# Patient Record
Sex: Female | Born: 1965 | Race: White | Hispanic: No | Marital: Married | Smoking: Current every day smoker
Health system: Southern US, Community
[De-identification: ages and names within clinical notes are randomized; demographics above are authoritative.]

## PROBLEM LIST (undated history)

## (undated) DIAGNOSIS — F419 Anxiety disorder, unspecified: Secondary | ICD-10-CM

## (undated) DIAGNOSIS — I10 Essential (primary) hypertension: Secondary | ICD-10-CM

## (undated) DIAGNOSIS — N926 Irregular menstruation, unspecified: Secondary | ICD-10-CM

## (undated) DIAGNOSIS — E785 Hyperlipidemia, unspecified: Secondary | ICD-10-CM

## (undated) DIAGNOSIS — R5383 Other fatigue: Secondary | ICD-10-CM

## (undated) DIAGNOSIS — R011 Cardiac murmur, unspecified: Secondary | ICD-10-CM

## (undated) DIAGNOSIS — E669 Obesity, unspecified: Secondary | ICD-10-CM

## (undated) HISTORY — DX: Irregular menstruation, unspecified: N92.6

## (undated) HISTORY — DX: Anxiety disorder, unspecified: F41.9

## (undated) HISTORY — DX: Other fatigue: R53.83

## (undated) HISTORY — PX: BACK SURGERY: SHX140

## (undated) HISTORY — DX: Essential (primary) hypertension: I10

## (undated) HISTORY — PX: SPINE SURGERY: SHX786

## (undated) HISTORY — DX: Obesity, unspecified: E66.9

## (undated) HISTORY — DX: Hyperlipidemia, unspecified: E78.5

## (undated) HISTORY — DX: Cardiac murmur, unspecified: R01.1

---

## 2000-10-29 ENCOUNTER — Inpatient Hospital Stay (HOSPITAL_COMMUNITY): Admission: RE | Admit: 2000-10-29 | Discharge: 2000-10-30 | Payer: Self-pay | Admitting: Neurosurgery

## 2000-10-29 ENCOUNTER — Encounter: Payer: Self-pay | Admitting: Neurosurgery

## 2001-08-14 ENCOUNTER — Inpatient Hospital Stay (HOSPITAL_COMMUNITY): Admission: RE | Admit: 2001-08-14 | Discharge: 2001-08-16 | Payer: Self-pay | Admitting: Neurosurgery

## 2001-08-14 ENCOUNTER — Encounter: Payer: Self-pay | Admitting: Neurosurgery

## 2004-04-08 ENCOUNTER — Emergency Department: Payer: Self-pay | Admitting: Emergency Medicine

## 2004-04-13 ENCOUNTER — Ambulatory Visit: Payer: Self-pay | Admitting: Family Medicine

## 2004-04-19 ENCOUNTER — Ambulatory Visit: Payer: Self-pay | Admitting: Family Medicine

## 2005-02-05 ENCOUNTER — Other Ambulatory Visit: Admission: RE | Admit: 2005-02-05 | Discharge: 2005-02-05 | Payer: Self-pay | Admitting: Obstetrics and Gynecology

## 2005-02-06 ENCOUNTER — Other Ambulatory Visit: Admission: RE | Admit: 2005-02-06 | Discharge: 2005-02-06 | Payer: Self-pay | Admitting: Obstetrics and Gynecology

## 2008-07-03 ENCOUNTER — Encounter: Admission: RE | Admit: 2008-07-03 | Discharge: 2008-07-03 | Payer: Self-pay | Admitting: Neurosurgery

## 2008-07-13 ENCOUNTER — Inpatient Hospital Stay (HOSPITAL_COMMUNITY): Admission: RE | Admit: 2008-07-13 | Discharge: 2008-07-18 | Payer: Self-pay | Admitting: Neurosurgery

## 2008-12-11 ENCOUNTER — Emergency Department: Payer: Self-pay | Admitting: Emergency Medicine

## 2009-01-05 ENCOUNTER — Encounter: Admission: RE | Admit: 2009-01-05 | Discharge: 2009-01-05 | Payer: Self-pay | Admitting: Neurosurgery

## 2009-01-18 ENCOUNTER — Ambulatory Visit (HOSPITAL_COMMUNITY): Admission: RE | Admit: 2009-01-18 | Discharge: 2009-01-18 | Payer: Self-pay | Admitting: Neurosurgery

## 2009-02-22 ENCOUNTER — Emergency Department: Payer: Self-pay | Admitting: Emergency Medicine

## 2010-04-05 IMAGING — RF DG MYELOGRAM LUMBAR
13 of 21 series · 13 of 21 positions shown · IV contrast (omnipaque)
Comparison: Preoperative examination 07/03/2008 MRI

MYELOGRAM  INJECTION
TECHNIQUE: Informed consent was obtained from the patient prior to
the procedure, including potential complications of headache,
allergy, infection and pain. Specific instructions were given
regarding 24 hour bedrest post procedure to prevent post-LP
headache.  A timeout procedure was performed.  With the patient
prone, the lower back was prepped with Betadine.  1% Lidocaine was
used for local anesthesia.  Lumbar puncture was performed by the
radiologist at the L2 - L3 level using a 22 gauge needle with
return of clear CSF.  15 cc of Omnipaque 180 was injected into the
subarachnoid space .
CLINICAL DATA: Right leg pain and numbness post surgery.  Previous
L4-5 discectomy with interbody and posterolateral fusion.
TECHNIQUE: Multidetector CT imaging of the lumbar spine was
performed following myelography.  Multiplanar CT image
reconstructions were also generated.

[Series 1: myelogram  white · 1 of 1 slices shown (1 of 13)]
[im 1/1]
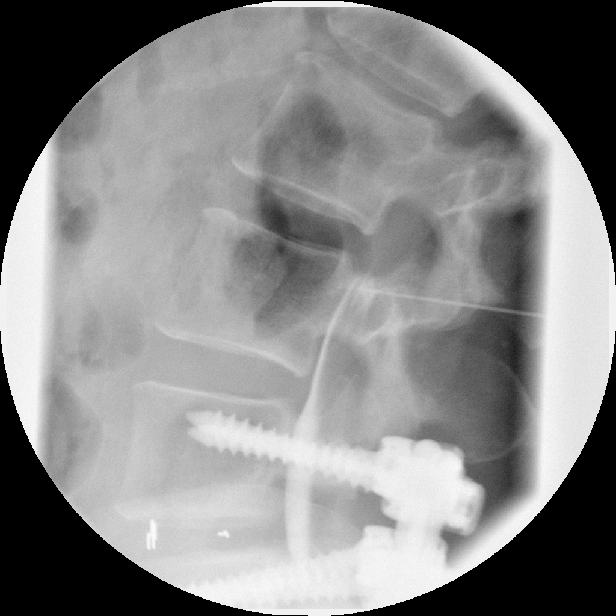

[Series 3: myelogram  white · 1 of 1 slices shown (2 of 13)]
[im 1/1]
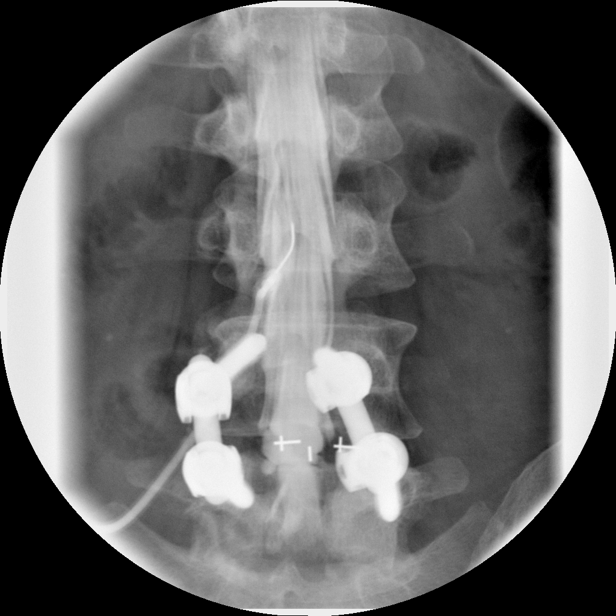

[Series 5: myelogram  white · 1 of 1 slices shown (3 of 13)]
[im 1/1]
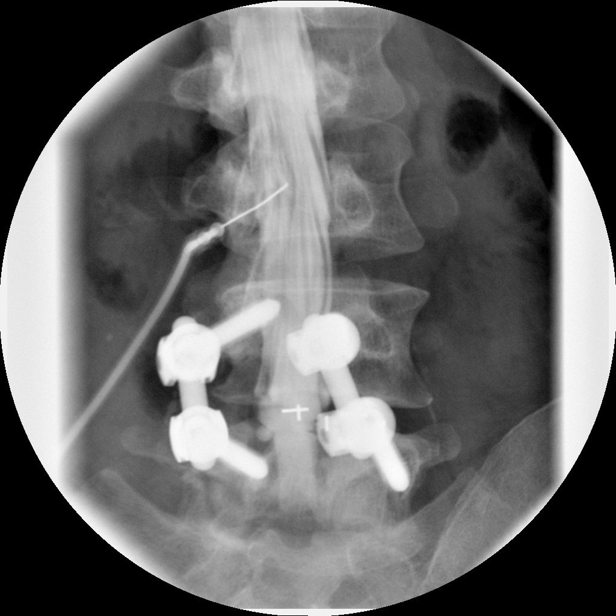

[Series 6: myelogram  white · 1 of 1 slices shown (4 of 13)]
[im 1/1]
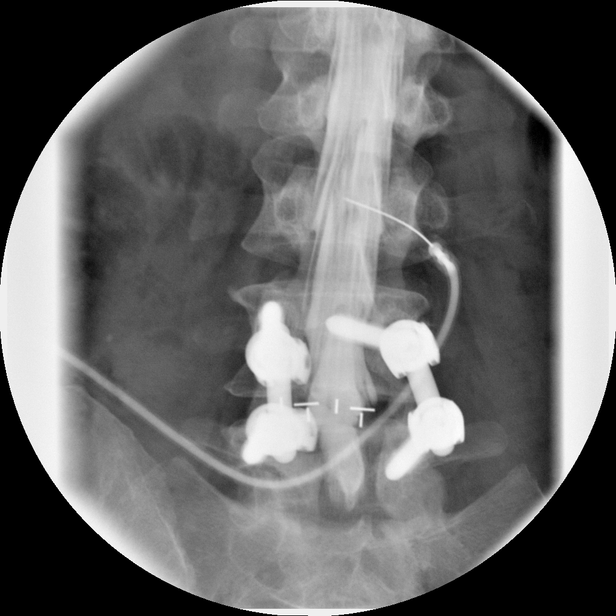

[Series 8: myelogram  white · 1 of 1 slices shown (5 of 13)]
[im 1/1]
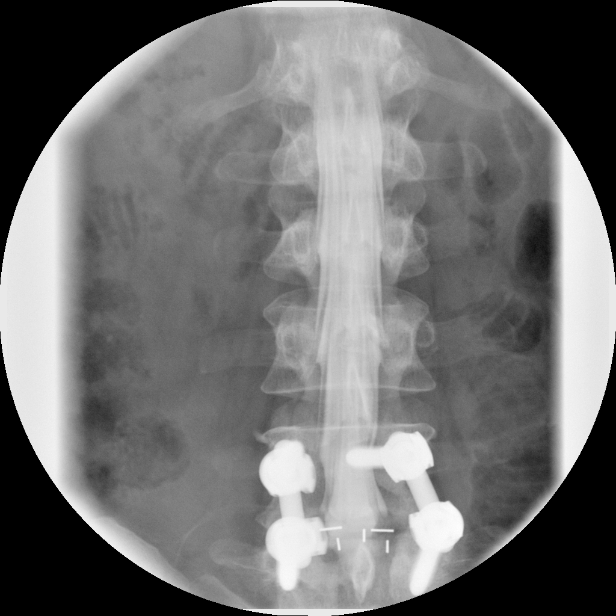

[Series 9: myelogram  white · 1 of 1 slices shown (6 of 13)]
[im 1/1]
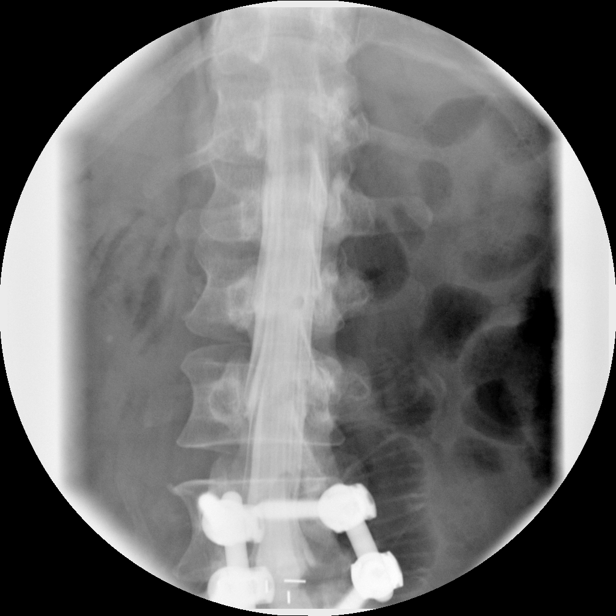

[Series 11: myelogram  white · 1 of 1 slices shown (7 of 13)]
[im 1/1]
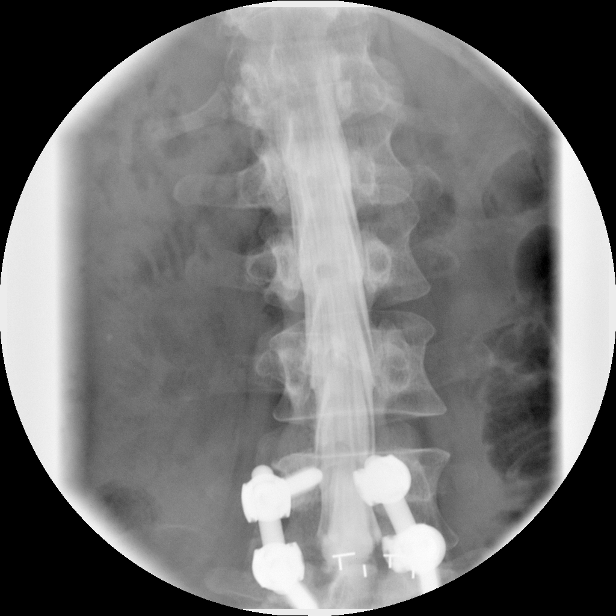

[Series 13: myelogram  white · 1 of 1 slices shown (8 of 13)]
[im 1/1]
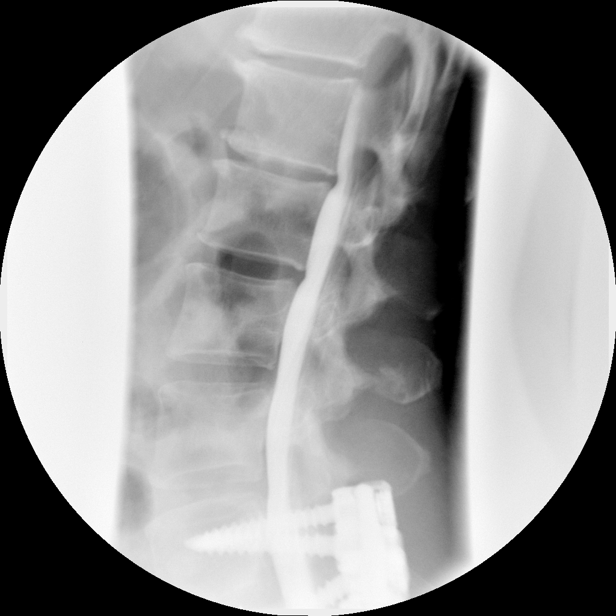

[Series 14: myelogram  white · 1 of 1 slices shown (9 of 13)]
[im 1/1]
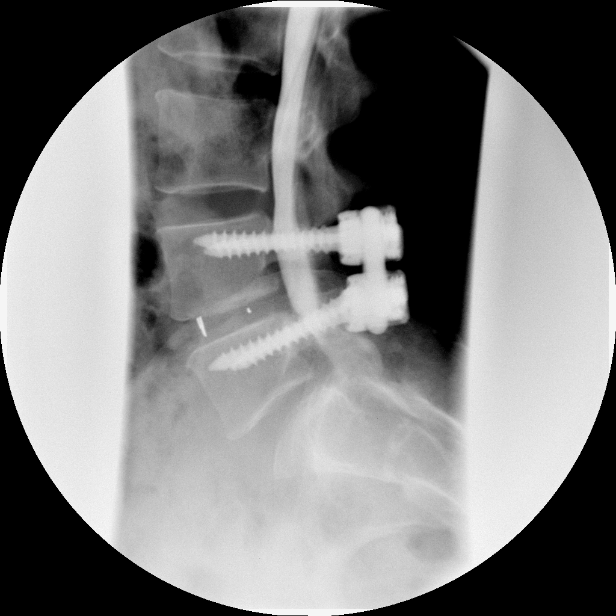

[Series 16: myelogram  white · 1 of 1 slices shown (10 of 13)]
[im 1/1]
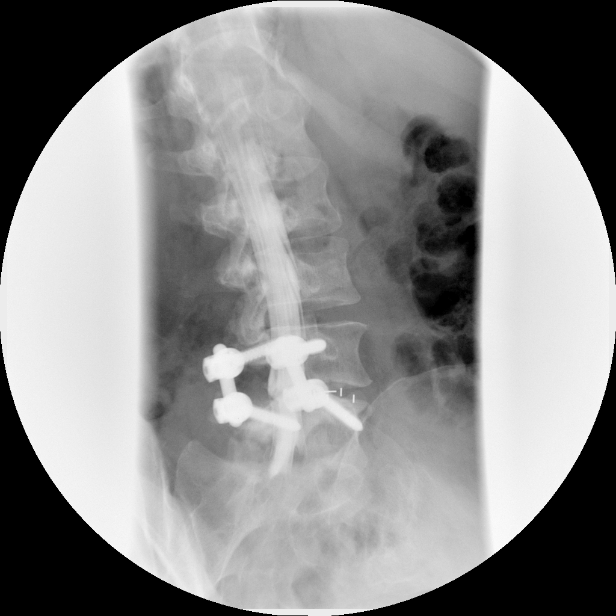

[Series 17: myelogram  white · 1 of 1 slices shown (11 of 13)]
[im 1/1]
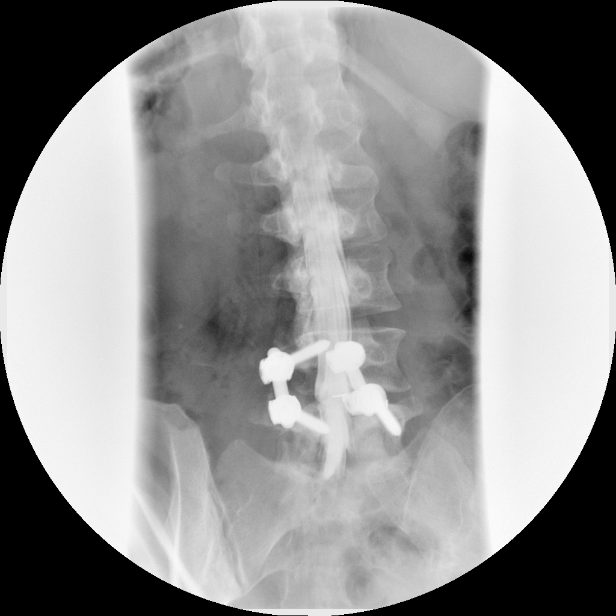

[Series 19: myelogram  white · 1 of 1 slices shown (12 of 13)]
[im 1/1]
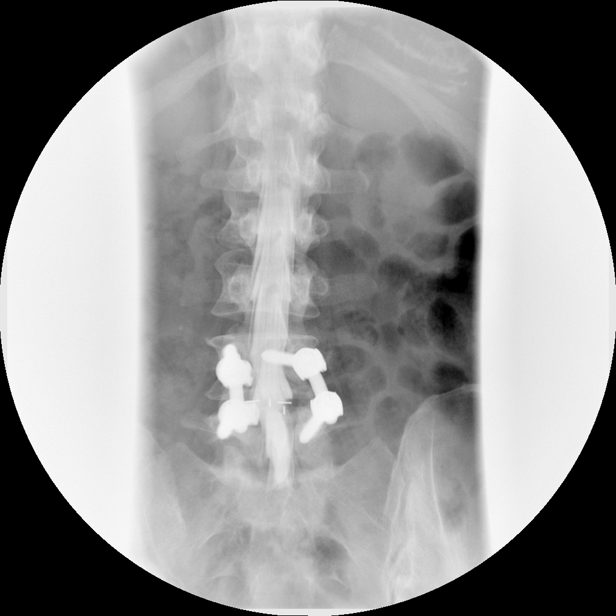

[Series 21: myelogram  white · 1 of 1 slices shown (13 of 13)]
[im 1/1]
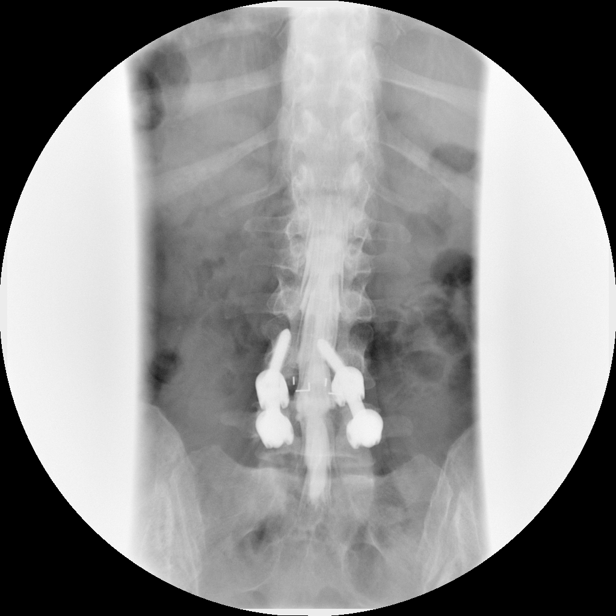

[13 of 21 positions shown; findings below may reference images not displayed]

IMPRESSION: Successful injection of  intrathecal contrast for myelography.

MYELOGRAM LUMBAR
FINDINGS: Good opacification of the subarachnoid space.  The left
L4, left L5 and right L5 screws are satisfactorily positioned.  The
right L4 screw is medially positioned, and it enters the lateral
recess of the spinal canal.  This displaces the right L4 and L5
nerve roots. Anatomic alignment is present.  Standing
flexion/extension views show no subluxation or dynamic instability.

Fluoroscopy Time: 1.32 minutes
IMPRESSION: As above

CT MYELOGRAPHY LUMBAR SPINE
FINDINGS: No prevertebral or paraspinous masses.

L1-2: Normal.

L2-3: Normal.

L3-4: Mild facet arthropathy.  Mild bulge.  No stenosis or disc
protrusion.

L4-5: Wide decompressive laminectomy.  The left L4 pedicle screw is
normally positioned.  The right L4 pedicle screw is medially
positioned and lies outside the pedicle.  The portion of the screw
within the spinal canal indents the thecal sac and displaces the
right L4 and right L5 nerve roots.  There is no CSF leakage.  There
appears to be early bony bridging across the interspace.  No
definite posterolateral fusion is seen. L5 pedicle screws normally
positioned.

L5-S1: Postsurgical changes, right.  Mild to moderate facet
arthropathy.  Mild bulge/shallow subligamentous protrusion without
displacement of the S1 nerve roots.  Mild neural foraminal
narrowing without definite L5 nerve root encroachment.

Three-dimensional reconstructions confirm medial placement of the
right L4 screw.
IMPRESSION: Medial placement of the right L4 pedicle screw has resulted in
displacement of the right L4 and right L5 nerve roots.  Correlate
clinically.

Otherwise satisfactory appearance L4-5 interbody and posterolateral
fusion as described.

Postsurgical changes at L5-S1, right without significant neural
encroachment.

No dynamic instability on standing flexion/extension views.

## 2010-05-30 LAB — CBC
HCT: 38.8 % (ref 36.0–46.0)
Hemoglobin: 13.8 g/dL (ref 12.0–15.0)
MCHC: 35.5 g/dL (ref 30.0–36.0)
Platelets: 228 10*3/uL (ref 150–400)
RDW: 13.1 % (ref 11.5–15.5)

## 2010-06-05 LAB — BASIC METABOLIC PANEL
BUN: 6 mg/dL (ref 6–23)
Calcium: 9.7 mg/dL (ref 8.4–10.5)
Creatinine, Ser: 0.73 mg/dL (ref 0.4–1.2)
GFR calc non Af Amer: >60 mL/min (ref >60)
Glucose, Bld: 98 mg/dL (ref 70–99)

## 2010-06-05 LAB — TYPE AND SCREEN
ABO/RH(D): O NEG
Antibody Screen: NEGATIVE

## 2010-06-05 LAB — CBC
HCT: 44.5 % (ref 36.0–46.0)
Platelets: 262 10*3/uL (ref 150–400)
RDW: 12.4 % (ref 11.5–15.5)
WBC: 12.5 10*3/uL — ABNORMAL HIGH (ref 4.0–10.5)

## 2010-07-10 NOTE — Op Note (Signed)
NAMEJASMARIE, [name]                 ACCOUNT NO.:  [id number]   MEDICAL RECORD NO.:  [id number]          PATIENT TYPE:  INP   LOCATION:  3022                         FACILITY:  [facility]   PHYSICIAN:  Hilda Lias, M.D.   DATE OF BIRTH:  [birth year]   DATE OF PROCEDURE:  07/13/2008  DATE OF DISCHARGE:                               OPERATIVE REPORT   PREOPERATIVE DIAGNOSES:  L4-L5 degenerative disk disease with a  herniated disk with multiple fragments centrally and to the left, status  post lumbar diskectomy.   POSTOPERATIVE DIAGNOSES:  L4-L5 degenerative disk disease with a  herniated disk with multiple fragments centrally and to the left, status  post lumbar diskectomy.   PROCEDURES:  L4 laminectomy and facetectomy, bilateral diskectomy,  interbody fusion with cages 8 x 22 with autograft and BMP, pedicle  screws L4-L5, posterolateral arthrodesis.   SURGEON:  Hilda Lias, MD   ASSISTANT:  Danae Orleans. Venetia Maxon, MD   CLINICAL HISTORY:  Ms. [name] is a 45 year old female who in the past  underwent a surgery.  Lately, she had been complaining of back pain  radiating to both legs, left worse than right one.  The patient has  failed with conservative treatment.  The MRI shows severe degenerative  disk disease at the L4-5 with several fragment going into the spinal  canal and going to the left side.  She had failed with conservative  treatment.  Surgery was advised.   PROCEDURE IN DETAIL:  The patient was taken to the OR and after  intubation, she was positioned in prone manner.  The skin was cleaned  with DuraPrep.  A midline incision from L4 to L5 was made and muscle was  retracted all the way laterally.  We found quite a bit of scar tissue  from previous surgery.  X-rays showed that indeed we were at the level  of L4-45.  Then, we removed the spinous process of L4 as well as the  lamina.  The patient had a quite a bit of overgrowth of the facet.  We  had to remove the facet to be into  the disk space.  In the right side,  after we did a lysis, we were able to get into the disk itself and total  diskectomy from the right side was done.  In the left side, she has  several fragments.  Some of them attached to the takeoff of the L5 nerve  root.  After removing the disk and cleaning the nerve root, there was an  arachnoid pouch, although no evidence of CSF leak.  We entered the disk  space and diskectomy from the left side was achieved.  After we removed  the endplate, two cages 8 x 22 with autograft and BMP were inserted.  This was followed by pedicle screws of 6.5 x 45.  Four of them were  used.  Prior to introduction, we probed the hole just to be sure there  were surrounded by bone.  The pedicle screw was kept in place with a rod  and caps.  AP and lateral x-rays showed  good positioning of the cages as  well as the rods.  Then, the arachnoid pouch was taken care using  Duragen and on top of that, we used Tisseel.  Valsalva maneuver was  negative.  Then, a mix of BMP and autograft was used in the lateral  aspect of the facet and transverse process for arthrodesis.  Then, the  wound was closed with Vicryl and nylon.  The patient did well.          ______________________________  Hilda Lias, M.D.    EB/MEDQ  D:  07/13/2008  T:  07/14/2008  Job:  [id number]

## 2010-07-10 NOTE — Discharge Summary (Signed)
NAMESHANTEA, POULTON                 ACCOUNT NO.:  [id number]   MEDICAL RECORD NO.:  [id number]          PATIENT TYPE:  INP   LOCATION:  3022                         FACILITY:  [facility]   PHYSICIAN:  Hilda Lias, M.D.   DATE OF BIRTH:  01-18-1966   DATE OF ADMISSION:  07/13/2008  DATE OF DISCHARGE:  07/18/2008                               DISCHARGE SUMMARY   ADMISSION DIAGNOSIS:  Degenerative disk disease at L4-5 with recurrent  herniated disk with multiple fragments.   FINAL DIAGNOSIS:  Degenerative disk disease at L4-5 with recurrent  herniated disk with multiple fragments.   CLINICAL HISTORY:  The patient was admitted because of back pain  radiation to both legs.  X-ray showed that she has a degenerative disk  disease of the level of 4-5.  Surgery was advised.  Laboratory normal.   COURSE IN THE HOSPITAL:  The patient was taken to surgery, and  diskectomy and fusion of 4-5 was done.  Today, she is doing much better.  She is walking, and she is ready to go home.   CONDITION ON DISCHARGE:  Improved.   MEDICATIONS:  Percocet, diazepam.   DIET:  Regular.   ACTIVITY:  Not to drive for 2 weeks.   FOLLOWUP:  To be seen by me in 10 days.           ______________________________  Hilda Lias, M.D.     EB/MEDQ  D:  07/18/2008  T:  07/19/2008  Job:  [id number]

## 2010-07-10 NOTE — H&P (Signed)
[name], [name]                 ACCOUNT NO.:  [id number]   MEDICAL RECORD NO.:  [id number]          PATIENT TYPE:  INP   LOCATION:  2899                         FACILITY:  [facility]   PHYSICIAN:  Hilda Lias, M.D.   DATE OF BIRTH:  June 02, 1965   DATE OF ADMISSION:  07/13/2008  DATE OF DISCHARGE:                              HISTORY & PHYSICAL   [name] is a lady who had been coming to my office complaining of back  pain radiating to the left leg, which is not getting any better.  Lately, the pain is going to both legs.  She is quite miserable.  She  has had conservative treatment and she is not any better.  This lady  underwent in the past bilateral 4-5 and 5-1 foraminotomy and diskectomy.  The x-rays showed a herniated disk with multiple fragment at the level  of 4-5.  She and her husband decided to go ahead with surgery.   PAST MEDICAL HISTORY:  Lumbar diskectomy and foraminotomy in 2003.  She  is not allergic to any medications.  She drinks socially.  She smokes  one pack of cigarettes a day.   FAMILY HISTORY:  Negative.   REVIEW OF SYSTEMS:  Positive for back and leg pain.   PHYSICAL EXAMINATION:  GENERAL:  The patient came to my office limping  from the left leg.  She was quite miserable.  She had difficulty sitting  and standing.  HEENT:  Head, ears, nose, and throat normal.  NECK:  Normal.  LUNGS:  Clear.  HEART:  Sounds normal.  ABDOMEN:  Normal.  EXTREMITIES:  Normal pulse.  NEUROLOGIC:  Mental status normal.  Cranial nerves normal.  Strength  showed that she has weakness on dorsiflexion of both legs, the left  worse than the right one, 3/5.  Reflexes symmetrical.  Sensation:  She  had numbness, which involves most of the dorsum of both feet.  Straight  leg raising is positive bilaterally at 45 degrees.   The MRI showed that the L5-S1 is normal.  At the level of 4-5, she has a  degenerative disk disease with several fragments in the canal causing  stenosis and  going to the foramen.   CLINICAL IMPRESSION:  Recurrent herniated disk with a degenerative disk  disease with multiple fragments into the canal and foramen.   RECOMMENDATIONS:  The patient being admitted for surgery because of the  findings clinically and by MRI.  We are going to proceed with diskectomy  interbody fusion, pedicle screws.  She knows about the risks of  infection, CSF leak, worsening pain, need for further surgery, and no  improvement whatsoever.           ______________________________  Hilda Lias, M.D.     EB/MEDQ  D:  07/13/2008  T:  07/14/2008  Job:  [id number]

## 2010-07-13 NOTE — Op Note (Signed)
[place]. [facility]  Patient:    [name], [name] Visit Number: [id number] MRN: [id number]          Service Type: SUR Location: 3000 3020 01 Attending Physician:  Danella Penton Dictated by:   Tanya Nones. Jeral Fruit, M.D. Proc. Date: 10/29/00 Admit Date:  10/29/2000                             Operative Report  PREOPERATIVE DIAGNOSIS:  Right L4, L5 radiculopathy secondary to herniated disk.  POSTOPERATIVE DIAGNOSIS:  Right L4, L5 radiculopathy secondary to herniated disk.  PROCEDURE:  Right L4-5 diskectomy, foraminotomy, C-arm. Met-RX.  SURGEON:  Tanya Nones. Jeral Fruit, M.D.  ASSISTANT:  Payton Doughty, M.D.  CLINICAL HISTORY:  The patient has been complaining of back and right leg pain, which is getting worse for the past few days.  Despite conservative treatment, she is not better.  The patient was seen by a local neurosurgeon, who advised more conservative treatment.  She came to see [place] for a second opinion and at the end, in view that she was getting worse, she decided to go ahead with surgery.  The risks were explained in the history and physical.  DESCRIPTION OF PROCEDURE:  The patient was taken to the OR, and she was positioned in a prone manner.  The back was prepped with Betadine.  We brought the C-arm and we did an AP view, which showed that indeed the point of the needle was at the level of the facet of L4-5.  Then we made a small incision through the skin.  We infiltrated the area with Xylocaine.  Then the fascia was opened and we introduced the dilator until we were able to put the final retractor of #6 right at the level of the junction of the lamina of L4, L5, and the medial aspect of the facet.  We brought the microscope into the area. The muscles were coagulated and removed.  We identified the edges of the facet as well as the laminae.  We drilled both of them and drilling the one-third of the medial lamina.  We removed the yellow  ligament.  Indeed, we found that the L5 nerve root was swollen, and there was quite a bit of stenosis.  We did some retraction, and finally we entered into the floor of the spine.  Indeed, there was a herniated disk.  We saw an extension going subligamentous into the L5 nerve root.  The incision was made, and a fragment was removed.  We entered the disk space and using the microcurette as well as the pituitary rongeur, total gross diskectomy was accomplished.  This process was done not only medially but laterally.  At the end we have a good decompression of the L5 as well as the L4 nerve root.  Investigation both above and below was negative for more free fragment.  Having done this, the Valsalva maneuver was negative. Fentanyl and Depo-Medrol were left in the epidural space, and the wound was closed with Vicryl and a Steri-Strips.  The patient did well. Dictated by:   Tanya Nones. Jeral Fruit, M.D. Attending Physician:  Danella Penton DD:  10/29/00 TD:  10/30/00 Job: [id number]

## 2010-07-13 NOTE — Op Note (Signed)
[place]. [facility]  Patient:    KHUSHBOO, CHUCK Visit Number: [id number] MRN: [id number]          Service Type: SUR Location: 3000 3018 01 Attending Physician:  Danella Penton Dictated by:   Tanya Nones. Jeral Fruit, M.D. Proc. Date: 08/14/01 Admit Date:  08/14/2001 Discharge Date: 08/16/2001                             Operative Report  PREOPERATIVE DIAGNOSIS:  Degenerative disk disease with a ______ lumbar spine with apparent recurrent disk at the L4-5 and foraminal stenosis at 5-1, bilateral leg pain.  POSTOPERATIVE DIAGNOSIS:  Degenerative disk disease with a ______ lumbar spine with apparent recurrent disk at the L4-5 and foraminal stenosis at 5-1, bilateral leg pain.  PROCEDURE:  Right L5 hemilaminectomy, right 4-5 diskectomy with lysis of adhesions, right L5-S1 foraminotomy, left 4-5 and 5-1 foraminotomies. Microscope.  SURGEON:  Tanya Nones. Jeral Fruit, M.D.  ASSISTANT:  Hewitt Shorts, M.D.  CLINICAL HISTORY:  This patient underwent surgery at the level of 4-5 about eight months ago.  Now, she had been complaining of back pain with radiation down to both legs, right worse than the left one, although there is some pain with the left worse than the right one.  Although _______, she smokes two packs of cigarettes a day, and she has been doing that for almost 20 years. She has a bad case of degenerative disk disease at the level of 4-5 and 5-1. Surgery was advised.  The patient knew of the risks which were explained in the History and Physical including the possibility that she might need fusion later on in life.  DESCRIPTION OF PROCEDURE:  The patient was taken to the OR and she was positioned in a prone manner.  A midline incision from L4 to S1 was made and fascia and muscle were retracted laterally.  We identified the 4-5 and 5-1.  On the right side, we found the area where she had the previous surgery.  The patient had quite a bit of  scar tissue in the epidural space.  It was difficult to localize the dural matter, and because of that, we proceeded with a hemilaminectomy of L5.  From then on, we were able to localize the dura, and we started our dissection doing lysis of adhesions, and we did a foraminotomy of 5-1 on the right side.  We identified the L5 nerve root.  We went lateral until we were able to mobilize the disk space.  What looked like recurrent disk on the MRI was no more than scar tissue.  Lysis was achieved.  We entered the disk space and quite a bit of degenerative disk disease as well as scar tissue was removed.  Having done this, we went to the left side, and then with the help of the microscope, we did laminotomy of 4-5.  We did foraminotomy to decompress the L5 and S1 nerve root.  An x-ray showed that indeed we were in the right area. From then on, the area was irrigated.  Valsalva maneuver was negative.  The fascia and muscle were closed with Vicryl and the skin closed with Steri-Strips.  The patient did well. Dictated by:   Tanya Nones. Jeral Fruit, M.D. Attending Physician:  Danella Penton DD:  08/14/01 TD:  08/17/01 Job: [id number]

## 2010-07-13 NOTE — H&P (Signed)
[name]. [facility]  Patient:    [name], TURCK Visit Number: [id number] MRN: [id number]          Service Type: SUR Location: 3000 3018 01 Attending Physician:  Danella Penton Dictated by:   Tanya Nones. Jeral Fruit, M.D. Admit Date:  08/14/2001 Discharge Date: 08/16/2001                           History and Physical  HISTORY:  Ms. [name] is a lady who is being admitted today because of back pain with radiation down to both legs.  This lady back last year underwent a right L4-5 diskectomy because of a herniated disk.  Nevertheless, the patient had been followed up in my office because she is complaining of pain not only in the right leg, but also in the left leg, and she is for admission.  She is unable to sit at this time, and she has difficulty walking.  She is limping from both legs, especially from the right leg.  The patient has failed conservative treatment.  The patient had an MRI.  Because of the finding, she wants to proceed with surgery.  PAST MEDICAL HISTORY:  An L4-5 diskectomy.  ALLERGIES:  No known drug allergies.  SOCIAL HISTORY:  She smokes two packs a day.  She has been doing that since she was 45 years old.  The patient does not drink.  FAMILY HISTORY:  Both parents are in good condition.  REVIEW OF SYSTEMS:  Positive for back pain and leg pain.  PHYSICAL EXAMINATION  GENERAL:  The patient came to my office first with her husband, and now with his mother.  She was having copious difficulty getting around.  HEENT:  Normal.  NECK:  Normal.  LUNGS:  Rhonchi bilaterally.  HEART:  Sounds are normal.  ABDOMEN:  Normal.  EXTREMITIES:  Normal pulses.  NEUROLOGIC:  Mental status normal.  Cranial nerves normal.  Strength shows that she has a 3/5 weakness in the dorsiflexor, plantar flexion in both legs, especially on the right leg.  The sensation:  She complains of numbness in the right foot.  Straight leg raising is positive  bilaterally about 60 degrees. She has a decreased flexion of the lumbar spine.  She has a scar in the lumbar area from previous surgery.  The MRI showed that indeed this lady showed that she has a herniated disk at the level of L4-5 going to the left side, with some disk going to the end plate of L4, which was not present before.  At the level of L5 one sees foraminal stenosis compromising both L5 and S1 nerve roots.  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  PLAN:  The patient is being admitted for surgery.  It is a very difficulty question because this lady is essentially too young to have this problem.  We are going to proceed with bilateral L4-L5 and L5-S1 foraminotomy and probably a diskectomy at the level of L4-5.  The risks that are quoted are the need for further surgery, which might include a back fusion, infection, CSF leak, no improvement whatsoever, or need for further surgery.  The patient also was advised of one thing that she can do for herself to help in her situation, is to stop smoking. Dictated by:   Tanya Nones. Jeral Fruit, M.D. Attending Physician:  Danella Penton DD:  08/14/01  TD:  08/16/01 Job: [id number]

## 2010-07-13 NOTE — H&P (Signed)
[place]. [facility]  Patient:    Amanda, Noble Visit Number: [id number] MRN: [id number]          Service Type: SUR Location: 3000 3020 01 Attending Physician:  Danella Penton Dictated by:   Tanya Nones. Jeral Fruit, M.D. Admit Date:  10/29/2000                           History and Physical  HISTORY OF PRESENT ILLNESS:  [name] is a lady who I saw on October 22, 2000, because of back pain with radiation down to the right leg going all the way down to the right foot since July of this year.  The patient was seen by another neurosurgeon in the area and she was treated with conservative treatment.  She was referred to my office because she was not getting any better up to the point that she was having difficulty sleeping and feeling that she was getting worse with more numbness and more weakness.  The patient denies any problem with the left leg.  PAST MEDICAL HISTORY:  Negative.  SOCIAL HISTORY:  She smokes two packs a day.  She has been doing that since she was 45 years old.  She is 45 at the present time.  The patient does not drink.  FAMILY HISTORY:  Negative.  REVIEW OF SYSTEMS:  Only positive for back and right leg pain.  MEDICATIONS:  She is taking prednisone, and she is taking also some pain medication.  PHYSICAL EXAMINATION:  GENERAL:  The patient came to my office with her husband.  She was limping from the right leg and it was difficult for her to sit or stand.  HEENT:  Normal.  NECK: Normal.  LUNGS:  Clear.  HEART:  The heart sounds are normal.  EXTREMITIES:  Normal.  Pulses normal.  NEUROLOGICAL:  Mental status normal.  Cranial nerves normal.  Strength is 5/5 except she has some weakness of the dorsiflexion in the right foot.  Reflexes are symmetrical.  Sensation appears decreased in the dorsum of the right foot. The patient has quite a bit of pain in flexion and extension of the lumbar spine with the straight leg raising  in the left side being positive 80 degrees, the right side about 30 degrees.  LABORATORY DATA:  The MRI showed that she has stenosis at the level of L4-5 with a central disk going to the right side.  CLINICAL IMPRESSION:  Right L5 radiculopathy secondary to stenosis and herniated disk.  RECOMMENDATION:  The patient wanted to proceed with the surgery.  She knows about the risks such as infection, CSF leak, worsening of the pain, paralysis and need for further surgery.  We are going to approach the area through a lateral incision using the microscope as well as the x-ray machine.  If that is difficult we will go ahead with the standard midline incision.  The patient fully agrees and she declines more opinion. Dictated by:   Tanya Nones. Jeral Fruit, M.D. Attending Physician:  Danella Penton DD:  10/29/00 TD:  10/29/00 Job: [id number]

## 2010-12-05 ENCOUNTER — Ambulatory Visit: Payer: Self-pay | Admitting: Obstetrics and Gynecology

## 2011-02-08 ENCOUNTER — Ambulatory Visit: Payer: Self-pay | Admitting: Obstetrics and Gynecology

## 2011-04-23 ENCOUNTER — Ambulatory Visit: Payer: Self-pay | Admitting: Internal Medicine

## 2012-04-01 ENCOUNTER — Ambulatory Visit: Payer: Self-pay | Admitting: Obstetrics and Gynecology

## 2013-01-28 ENCOUNTER — Ambulatory Visit: Payer: Self-pay | Admitting: General Practice

## 2013-06-16 ENCOUNTER — Emergency Department: Payer: Self-pay | Admitting: Emergency Medicine

## 2013-06-16 LAB — CBC
HCT: 45.6 % (ref 35.0–47.0)
HGB: 15.2 g/dL (ref 12.0–16.0)
MCH: 29.5 pg (ref 26.0–34.0)
MCHC: 33.4 g/dL (ref 32.0–36.0)
MCV: 88 fL (ref 80–100)
Platelet: 247 10*3/uL (ref 150–440)
RBC: 5.16 10*6/uL (ref 3.80–5.20)
RDW: 12.9 % (ref 11.5–14.5)
WBC: 10.8 10*3/uL (ref 3.6–11.0)

## 2013-06-16 LAB — BASIC METABOLIC PANEL
ANION GAP: 6 — AB (ref 7–16)
BUN: 13 mg/dL (ref 7–18)
CALCIUM: 8.7 mg/dL (ref 8.5–10.1)
CHLORIDE: 107 mmol/L (ref 98–107)
CO2: 27 mmol/L (ref 21–32)
Creatinine: 0.93 mg/dL (ref 0.60–1.30)
Glucose: 127 mg/dL — ABNORMAL HIGH (ref 65–99)
Osmolality: 281 (ref 275–301)
Potassium: 3.5 mmol/L (ref 3.5–5.1)
Sodium: 140 mmol/L (ref 136–145)

## 2013-06-16 LAB — TROPONIN I
Troponin-I: <0.02 ng/mL
Troponin-I: <0.02 ng/mL

## 2013-06-17 LAB — D-DIMER(ARMC): D-DIMER: 319 ng/mL

## 2014-04-07 ENCOUNTER — Ambulatory Visit: Payer: Self-pay | Admitting: Physician Assistant

## 2014-04-20 ENCOUNTER — Ambulatory Visit: Payer: Self-pay | Admitting: Physician Assistant

## 2015-02-07 ENCOUNTER — Encounter: Payer: Self-pay | Admitting: Physician Assistant

## 2015-02-07 ENCOUNTER — Ambulatory Visit: Payer: Self-pay | Admitting: Physician Assistant

## 2015-02-07 VITALS — BP 122/70 | HR 71 | Temp 98.2°F

## 2015-02-07 DIAGNOSIS — J069 Acute upper respiratory infection, unspecified: Secondary | ICD-10-CM

## 2015-02-07 MED ORDER — HYDROCOD POLST-CPM POLST ER 10-8 MG/5ML PO SUER: 5.0000 mL | Freq: Two times a day (BID) | ORAL | Status: DC | PRN

## 2015-02-07 MED ORDER — AZITHROMYCIN 250 MG PO TABS
ORAL_TABLET | ORAL | Status: DC
Start: 2015-02-07 — End: 2016-01-22

## 2015-02-07 MED ORDER — METHYLPREDNISOLONE 4 MG PO TBPK
ORAL_TABLET | ORAL | Status: DC
Start: 2015-02-07 — End: 2016-01-22

## 2015-02-07 NOTE — Progress Notes (Signed)
S: C/o runny nose and congestion for 3 days, hoarse voice, sinus drainage, cough,  no fever, chills, cp/sob, v/d; mucus is green and thick, cough is sporadic, c/o of facial and dental pain.   Using otc meds:   O: PE: vitals wnl, nad,  perrl eomi, normocephalic, tms dull, nasal mucosa red and swollen, throat injected, neck supple no lymph, lungs c t a, cv rrr, neuro intact  A:  Acute sinusitis   P: zpack, medrol dose pack, tussionex nr; drink fluids, continue regular meds , use otc meds of choice, return if not improving in 5 days, return earlier if worsening

## 2015-07-10 ENCOUNTER — Other Ambulatory Visit: Payer: Self-pay | Admitting: Obstetrics and Gynecology

## 2015-07-10 DIAGNOSIS — Z1231 Encounter for screening mammogram for malignant neoplasm of breast: Secondary | ICD-10-CM

## 2015-07-26 ENCOUNTER — Ambulatory Visit
Admission: RE | Admit: 2015-07-26 | Discharge: 2015-07-26 | Disposition: A | Discharge status: Home / Self Care | Payer: Managed Care, Other (non HMO) | Source: Ambulatory Visit | Attending: Obstetrics and Gynecology | Admitting: Obstetrics and Gynecology

## 2015-07-26 DIAGNOSIS — Z1231 Encounter for screening mammogram for malignant neoplasm of breast: Secondary | ICD-10-CM | POA: Diagnosis present

## 2015-10-02 ENCOUNTER — Encounter: Payer: Self-pay | Admitting: Physician Assistant

## 2015-10-02 ENCOUNTER — Ambulatory Visit: Payer: Self-pay | Admitting: Physician Assistant

## 2015-10-02 VITALS — BP 110/70 | HR 72 | Temp 98.5°F

## 2015-10-02 DIAGNOSIS — M25571 Pain in right ankle and joints of right foot: Secondary | ICD-10-CM

## 2015-10-02 NOTE — Progress Notes (Signed)
S: c/o of r ankle pain, no new injury, states she feel last year and didn't get checked, the ankle got better, but this weekend she started having sharp pain at outside of ankle and across top of foot, no swelling, using an ankle brace, took some ibuprofen without relief;   O: vitals wnl, nad, ankle without gross deformity. No swelling. Tender to palpation anterior laterally. Pain with passive external rotation>inversion. Ligamentous function stable and neurovascularly intact. Pain with WB effort. N/v intact  A: r ankle pain  P: RICE, pt has crutches at home and is to use these, f/u with ortho, tramadol prn, mobic  qd

## 2015-10-11 NOTE — Progress Notes (Signed)
Patient ID: [name], female   DOB: Feb 23, 1966, 50 y.o.   MRN: [id number] Patient has been referred to see Dr. Real ConsKrazinski at [facility] on 10/16/15 @ 3:30pm. Patient has been notified and has accepted the appointment.

## 2016-01-22 ENCOUNTER — Ambulatory Visit: Payer: Self-pay | Admitting: Physician Assistant

## 2016-01-22 ENCOUNTER — Encounter: Payer: Self-pay | Admitting: Physician Assistant

## 2016-01-22 VITALS — BP 130/80 | HR 76 | Temp 98.0°F

## 2016-01-22 DIAGNOSIS — R51 Headache: Principal | ICD-10-CM

## 2016-01-22 DIAGNOSIS — R519 Headache, unspecified: Secondary | ICD-10-CM

## 2016-01-22 MED ORDER — PSEUDOEPH-BROMPHEN-DM 30-2-10 MG/5ML PO SYRP: 5.0000 mL | ORAL_SOLUTION | Freq: Four times a day (QID) | ORAL | 0 refills | Status: DC | PRN

## 2016-01-22 MED ORDER — BUTALBITAL-APAP-CAFFEINE 50-325-40 MG PO TABS: 1.0000 | ORAL_TABLET | Freq: Four times a day (QID) | ORAL | 0 refills | Status: DC | PRN

## 2016-01-22 NOTE — Progress Notes (Signed)
   Subjective:Sinus Headache    Patient ID: [name], female    DOB: Jul 13, 1965, 50 y.o.   MRN: [id number]  HPI Patient c/o frontal headache for 3 days. Also have sinus pressure and non-productive cough. Denies fever/chills, or N/V/D.    Review of Systems Negative except for compliant.    Objective:   Physical Exam Bilateral frontal and maxillary sinus guarding. Edematous nasal turbinates. Post nasal drainage. Neck supple, Lungs CTA, and Heart RRR       Assessment & Plan:Sinus Headache  Bromfed-DM and Esgic as directed. Follow up PRN.

## 2016-01-26 ENCOUNTER — Encounter: Payer: Self-pay | Admitting: Physician Assistant

## 2016-01-26 ENCOUNTER — Ambulatory Visit: Payer: Self-pay | Admitting: Physician Assistant

## 2016-01-26 VITALS — BP 148/90 | HR 71 | Temp 98.6°F

## 2016-01-26 DIAGNOSIS — R519 Headache, unspecified: Secondary | ICD-10-CM

## 2016-01-26 DIAGNOSIS — R51 Headache: Secondary | ICD-10-CM

## 2016-01-26 DIAGNOSIS — J0100 Acute maxillary sinusitis, unspecified: Secondary | ICD-10-CM

## 2016-01-26 MED ORDER — AMOXICILLIN 875 MG PO TABS: 875.0000 mg | ORAL_TABLET | Freq: Two times a day (BID) | ORAL | 0 refills | Status: DC

## 2016-01-26 MED ORDER — FLUCONAZOLE 150 MG PO TABS: 150.0000 mg | ORAL_TABLET | Freq: Once | ORAL | 0 refills | Status: AC

## 2016-01-26 NOTE — Progress Notes (Signed)
S: C/o cough and congestion for 3 days, no fever, chills, cp/sob, v/d; mucus is green and thick, cough is sporadic, c/o of facial and dental pain. Headache since Monday, seen here given fioricet which helped and bromfed, called out of work mon,t,,w; went to work yesterday for about 2 hours and had to go back home  Using otc meds:   O: PE: vitals wnl, nad, perrl eomi, normocephalic, tms dull, nasal mucosa red and swollen, throat injected, neck supple no lymph, lungs c t a, cv rrr, neuro intact  A:  Acute sinusitis, sinus headache   P: drink fluids, continue regular meds , use otc meds of choice, return if not improving in 5 days, return earlier if worsening , amoxil , diflucan if needed

## 2016-02-02 ENCOUNTER — Ambulatory Visit: Payer: Self-pay | Admitting: Physician Assistant

## 2016-02-02 DIAGNOSIS — Z299 Encounter for prophylactic measures, unspecified: Secondary | ICD-10-CM

## 2016-02-02 NOTE — Progress Notes (Signed)
Patient came in to have blood drawn for testing per Susan's authorization. 

## 2016-02-03 LAB — CMP12+LP+TP+TSH+6AC+CBC/D/PLT
ALK PHOS: 92 IU/L (ref 39–117)
ALT: 26 IU/L (ref 0–32)
AST: 25 IU/L (ref 0–40)
Albumin/Globulin Ratio: 2.5 — ABNORMAL HIGH (ref 1.2–2.2)
Albumin: 4.5 g/dL (ref 3.5–5.5)
BASOS: 1 %
BUN / CREAT RATIO: 13 (ref 9–23)
BUN: 9 mg/dL (ref 6–24)
Basophils Absolute: 0.1 10*3/uL (ref 0.0–0.2)
Bilirubin Total: 0.3 mg/dL (ref 0.0–1.2)
CALCIUM: 9.2 mg/dL (ref 8.7–10.2)
CHLORIDE: 99 mmol/L (ref 96–106)
CREATININE: 0.71 mg/dL (ref 0.57–1.00)
Chol/HDL Ratio: 6.9 ratio units — ABNORMAL HIGH (ref 0.0–4.4)
Cholesterol, Total: 310 mg/dL — ABNORMAL HIGH (ref 100–199)
EOS (ABSOLUTE): 0.3 10*3/uL (ref 0.0–0.4)
EOS: 3 %
ESTIMATED CHD RISK: 2 times avg. — AB (ref 0.0–1.0)
FREE THYROXINE INDEX: 1.7 (ref 1.2–4.9)
GFR calc Af Amer: 116 mL/min/{1.73_m2} (ref >59)
GFR, EST NON AFRICAN AMERICAN: 100 mL/min/{1.73_m2} (ref >59)
GGT: 39 IU/L (ref 0–60)
GLOBULIN, TOTAL: 1.8 g/dL (ref 1.5–4.5)
GLUCOSE: 84 mg/dL (ref 65–99)
HDL: 45 mg/dL (ref >39)
HEMATOCRIT: 42 % (ref 34.0–46.6)
HEMOGLOBIN: 14.7 g/dL (ref 11.1–15.9)
IMMATURE GRANS (ABS): 0 10*3/uL (ref 0.0–0.1)
IMMATURE GRANULOCYTES: 0 %
Iron: 106 ug/dL (ref 27–159)
LDH: 199 IU/L (ref 119–226)
LDL CALC: 239 mg/dL — AB (ref 0–99)
LYMPHS: 29 %
Lymphocytes Absolute: 2.7 10*3/uL (ref 0.7–3.1)
MCH: 31 pg (ref 26.6–33.0)
MCHC: 35 g/dL (ref 31.5–35.7)
MCV: 89 fL (ref 79–97)
Monocytes Absolute: 0.7 10*3/uL (ref 0.1–0.9)
Monocytes: 8 %
NEUTROS PCT: 59 %
Neutrophils Absolute: 5.4 10*3/uL (ref 1.4–7.0)
PHOSPHORUS: 3.3 mg/dL (ref 2.5–4.5)
POTASSIUM: 4.6 mmol/L (ref 3.5–5.2)
Platelets: 242 10*3/uL (ref 150–379)
RBC: 4.74 x10E6/uL (ref 3.77–5.28)
RDW: 13.5 % (ref 12.3–15.4)
SODIUM: 138 mmol/L (ref 134–144)
T3 Uptake Ratio: 25 % (ref 24–39)
T4, Total: 6.9 ug/dL (ref 4.5–12.0)
TRIGLYCERIDES: 131 mg/dL (ref 0–149)
TSH: 1.38 u[IU]/mL (ref 0.450–4.500)
Total Protein: 6.3 g/dL (ref 6.0–8.5)
URIC ACID: 4 mg/dL (ref 2.5–7.1)
VLDL Cholesterol Cal: 26 mg/dL (ref 5–40)
WBC: 9.1 10*3/uL (ref 3.4–10.8)

## 2016-02-03 LAB — VITAMIN D 25 HYDROXY (VIT D DEFICIENCY, FRACTURES): VIT D 25 HYDROXY: 43 ng/mL (ref 30.0–100.0)

## 2016-02-05 MED ORDER — SIMVASTATIN 20 MG PO TABS: 20.0000 mg | ORAL_TABLET | Freq: Every day | ORAL | 3 refills | Status: DC

## 2016-02-05 NOTE — Addendum Note (Signed)
Addended by: Faythe GheeFISHER, Shamond Skelton W on: 02/05/2016 08:53 AM   Modules accepted: Orders

## 2016-02-07 ENCOUNTER — Other Ambulatory Visit: Payer: Self-pay | Admitting: Physician Assistant

## 2016-02-08 ENCOUNTER — Ambulatory Visit (INDEPENDENT_AMBULATORY_CARE_PROVIDER_SITE_OTHER): Payer: Managed Care, Other (non HMO) | Admitting: Obstetrics and Gynecology

## 2016-02-08 ENCOUNTER — Encounter: Payer: Self-pay | Admitting: Obstetrics and Gynecology

## 2016-02-08 VITALS — BP 151/7 | HR 86 | Ht 62.0 in | Wt 167.0 lb

## 2016-02-08 DIAGNOSIS — N898 Other specified noninflammatory disorders of vagina: Secondary | ICD-10-CM

## 2016-02-08 DIAGNOSIS — Z780 Asymptomatic menopausal state: Secondary | ICD-10-CM | POA: Diagnosis not present

## 2016-02-08 DIAGNOSIS — Z720 Tobacco use: Secondary | ICD-10-CM | POA: Diagnosis not present

## 2016-02-08 DIAGNOSIS — E785 Hyperlipidemia, unspecified: Secondary | ICD-10-CM

## 2016-02-08 DIAGNOSIS — E6609 Other obesity due to excess calories: Secondary | ICD-10-CM | POA: Diagnosis not present

## 2016-02-08 DIAGNOSIS — Z01419 Encounter for gynecological examination (general) (routine) without abnormal findings: Secondary | ICD-10-CM | POA: Diagnosis not present

## 2016-02-08 DIAGNOSIS — Z6830 Body mass index (BMI) 30.0-30.9, adult: Secondary | ICD-10-CM

## 2016-02-08 DIAGNOSIS — F419 Anxiety disorder, unspecified: Secondary | ICD-10-CM | POA: Insufficient documentation

## 2016-02-08 DIAGNOSIS — E669 Obesity, unspecified: Secondary | ICD-10-CM | POA: Insufficient documentation

## 2016-02-08 DIAGNOSIS — E782 Mixed hyperlipidemia: Secondary | ICD-10-CM | POA: Insufficient documentation

## 2016-02-08 NOTE — Progress Notes (Signed)
ANNUAL PREVENTATIVE CARE GYN  ENCOUNTER NOTE  Subjective:       [name] is a 50 y.o. 522P2002 female here for a routine annual gynecologic exam.  Current complaints: 1.  none   Patient is menopausal without significant vasomotor symptoms. Vaginal dryness is a mild issue Patient continues to smoke. Bowel and bladder function are normal.   Gynecologic History No LMP recorded. Patient is postmenopausal. Contraception: post menopausal status Last Pap: 12/2011 reflex wnl. Results were: normal Last mammogram: 07/26/2015 birad 1 Results were: normal  Obstetric History OB History  Gravida Para Term Preterm AB Living  2 2 2     2   SAB TAB Ectopic Multiple Live Births          2    # Outcome Date GA Lbr Len/2nd Weight Sex Delivery Anes PTL Lv  2 Term 1999   7 lb (3.175 kg) F Vag-Spont   LIV  1 Term 1995   8 lb (3.629 kg) M Vag-Spont   LIV      Past Medical History:  Diagnosis Date  . Anxiety   . Fatigue   . Hyperlipemia   . Irregular menstrual cycle     Past Surgical History:  Procedure Laterality Date  . BACK SURGERY     x 4    Current Outpatient Prescriptions on File Prior to Visit  Medication Sig Dispense Refill  . simvastatin (ZOCOR) 20 MG tablet Take 1 tablet (20 mg total) by mouth at bedtime. 90 tablet 3   No current facility-administered medications on file prior to visit.     No Known Allergies  Social History   Social History  . Marital status: Married    Spouse name: N/A  . Number of children: N/A  . Years of education: N/A   Occupational History  . Not on file.   Social History Main Topics  . Smoking status: Current Every Day Smoker  . Smokeless tobacco: Not on file  . Alcohol use Not on file  . Drug use: Unknown  . Sexual activity: Not on file   Other Topics Concern  . Not on file   Social History Narrative  . No narrative on file    No family history on file.  The following portions of the patient's history were reviewed and  updated as appropriate: allergies, current medications, past family history, past medical history, past social history, past surgical history and problem list.  Review of Systems ROS Review of Systems - General ROS: negative for - chills, fatigue, fever, hot flashes, night sweats, weight gain or weight loss Psychological ROS: negative for - anxiety, decreased libido, depression, mood swings, physical abuse or sexual abuse Ophthalmic ROS: negative for - blurry vision, eye pain or loss of vision ENT ROS: negative for - headaches, hearing change, visual changes or vocal changes Allergy and Immunology ROS: negative for - hives, itchy/watery eyes or seasonal allergies Hematological and Lymphatic ROS: negative for - bleeding problems, bruising, swollen lymph nodes or weight loss Endocrine ROS: negative for - galactorrhea, hair pattern changes, hot flashes, malaise/lethargy, mood swings, palpitations, polydipsia/polyuria, skin changes, temperature intolerance or unexpected weight changes Breast ROS: negative for - new or changing breast lumps or nipple discharge Respiratory ROS: negative for - cough or shortness of breath Cardiovascular ROS: negative for - chest pain, irregular heartbeat, palpitations or shortness of breath Gastrointestinal ROS: no abdominal pain, change in bowel habits, or black or bloody stools Genito-Urinary ROS: no dysuria, trouble voiding, or hematuria Musculoskeletal  ROS: negative for - joint pain or joint stiffness Neurological ROS: negative for - bowel and bladder control changes Dermatological ROS: negative for rash and skin lesion changes   Objective:   BP (!) 151/7   Pulse 86   Ht 5\' 2"  (1.575 m)   Wt 167 lb (75.8 kg)   BMI 30.54 kg/m  CONSTITUTIONAL: Well-developed, well-nourished female in no acute distress.  PSYCHIATRIC: Normal mood and affect. Normal behavior. Normal judgment and thought content. NEUROLGIC: Alert and oriented to person, place, and time. Normal  muscle tone coordination. No cranial nerve deficit noted. HENT:  Normocephalic, atraumatic, External right and left ear normal. Oropharynx is clear and moist EYES: Conjunctivae and EOM are normal. Pupils are equal, round, and reactive to light. No scleral icterus.  NECK: Normal range of motion, supple, no masses.  Normal thyroid.  SKIN: Skin is warm and dry. No rash noted. Not diaphoretic. No erythema. No pallor. CARDIOVASCULAR: Normal heart rate noted, regular rhythm, no murmur. RESPIRATORY: Clear to auscultation bilaterally. Effort and breath sounds normal, no problems with respiration noted. BREASTS: Symmetric in size. No masses, skin changes, nipple drainage, or lymphadenopathy. ABDOMEN: Soft, normal bowel sounds, no distention noted.  No tenderness, rebound or guarding.  BLADDER: Normal PELVIC:  External Genitalia: Normal  BUS: Normal  Vagina: Normal  Cervix: Normal; no cervical motion tenderness; slightly friable with Pap smear   Uterus: Normal; midplane, normal size and shape, mobile, nontender  Adnexa: Normal; nonpalpable and nontender  RV: External Exam NormaI, No Rectal Masses and Normal Sphincter tone  MUSCULOSKELETAL: Normal range of motion. No tenderness.  No cyanosis, clubbing, or edema.  2+ distal pulses. LYMPHATIC: No Axillary, Supraclavicular, or Inguinal Adenopathy.    Assessment:   Annual gynecologic examination 50 y.o. Contraception: post menopausal status Overweight Tobacco user Menopause, asymptomatic Vaginal dryness, minimally symptomatic Plan:  Pap: Pap Co Test Mammogram: utd Stool Guaiac Testing:  due age 50 Labs: thru employer Routine preventative health maintenance measures emphasized: Exercise/Diet/Weight control, Tobacco Warnings and Alcohol/Substance use risks Over-the-counter lubricants recommended:  Astroglide  JO H2O lubricant  Olive oil or Coconut oil  Return to Clinic - 1 Year   SunGardCrystal Noble, CMA  Herold HarmsMartin A Amanda Vent, MD  Note: This  dictation was prepared with Dragon dictation along with smaller phrase technology. Any transcriptional errors that result from this process are unintentional.

## 2016-02-08 NOTE — Patient Instructions (Addendum)
1. Pap smear is done 2. Mammogram has already been done this year 3. Screening labs have already been completed 4. Recommend healthy eating with exercise and controlled weight loss 5. Over-the-counter lubricants are recommended for vaginal dryness  Astroglide  JO H2O lubricant  Olive oil or Coconut oil 6. Smoking cessation strongly encouraged 7. Return in 1 year for annual exam   Health Maintenance, Female Introduction Adopting a healthy lifestyle and getting preventive care can go a long way to promote health and wellness. Talk with your health care provider about what schedule of regular examinations is right for you. This is a good chance for you to check in with your provider about disease prevention and staying healthy. In between checkups, there are plenty of things you can do on your own. Experts have done a lot of research about which lifestyle changes and preventive measures are most likely to keep you healthy. Ask your health care provider for more information. Weight and diet Eat a healthy diet  Be sure to include plenty of vegetables, fruits, low-fat dairy products, and lean protein.  Do not eat a lot of foods high in solid fats, added sugars, or salt.  Get regular exercise. This is one of the most important things you can do for your health.  Most adults should exercise for at least 150 minutes each week. The exercise should increase your heart rate and make you sweat (moderate-intensity exercise).  Most adults should also do strengthening exercises at least twice a week. This is in addition to the moderate-intensity exercise. Maintain a healthy weight  Body mass index (BMI) is a measurement that can be used to identify possible weight problems. It estimates body fat based on height and weight. Your health care provider can help determine your BMI and help you achieve or maintain a healthy weight.  For females 65 years of age and older:  A BMI below 18.5 is considered  underweight.  A BMI of 18.5 to 24.9 is normal.  A BMI of 25 to 29.9 is considered overweight.  A BMI of 30 and above is considered obese. Watch levels of cholesterol and blood lipids  You should start having your blood tested for lipids and cholesterol at 49 years of age, then have this test every 5 years.  You may need to have your cholesterol levels checked more often if:  Your lipid or cholesterol levels are high.  You are older than 48 years of age.  You are at high risk for heart disease. Cancer screening Lung Cancer  Lung cancer screening is recommended for adults 54-32 years old who are at high risk for lung cancer because of a history of smoking.  A yearly low-dose CT scan of the lungs is recommended for people who:  Currently smoke.  Have quit within the past 15 years.  Have at least a 30-pack-year history of smoking. A pack year is smoking an average of one pack of cigarettes a day for 1 year.  Yearly screening should continue until it has been 15 years since you quit.  Yearly screening should stop if you develop a health problem that would prevent you from having lung cancer treatment. Breast Cancer  Practice breast self-awareness. This means understanding how your breasts normally appear and feel.  It also means doing regular breast self-exams. Let your health care provider know about any changes, no matter how small.  If you are in your 20s or 30s, you should have a clinical breast exam (CBE)  by a health care provider every 1-3 years as part of a regular health exam.  If you are 27 or older, have a CBE every year. Also consider having a breast X-ray (mammogram) every year.  If you have a family history of breast cancer, talk to your health care provider about genetic screening.  If you are at high risk for breast cancer, talk to your health care provider about having an MRI and a mammogram every year.  Breast cancer gene (BRCA) assessment is recommended  for women who have family members with BRCA-related cancers. BRCA-related cancers include:  Breast.  Ovarian.  Tubal.  Peritoneal cancers.  Results of the assessment will determine the need for genetic counseling and BRCA1 and BRCA2 testing. Cervical Cancer  Your health care provider may recommend that you be screened regularly for cancer of the pelvic organs (ovaries, uterus, and vagina). This screening involves a pelvic examination, including checking for microscopic changes to the surface of your cervix (Pap test). You may be encouraged to have this screening done every 3 years, beginning at age 9.  For women ages 80-65, health care providers may recommend pelvic exams and Pap testing every 3 years, or they may recommend the Pap and pelvic exam, combined with testing for human papilloma virus (HPV), every 5 years. Some types of HPV increase your risk of cervical cancer. Testing for HPV may also be done on women of any age with unclear Pap test results.  Other health care providers may not recommend any screening for nonpregnant women who are considered low risk for pelvic cancer and who do not have symptoms. Ask your health care provider if a screening pelvic exam is right for you.  If you have had past treatment for cervical cancer or a condition that could lead to cancer, you need Pap tests and screening for cancer for at least 20 years after your treatment. If Pap tests have been discontinued, your risk factors (such as having a new sexual partner) need to be reassessed to determine if screening should resume. Some women have medical problems that increase the chance of getting cervical cancer. In these cases, your health care provider may recommend more frequent screening and Pap tests. Colorectal Cancer  This type of cancer can be detected and often prevented.  Routine colorectal cancer screening usually begins at 49 years of age and continues through 49 years of age.  Your health  care provider may recommend screening at an earlier age if you have risk factors for colon cancer.  Your health care provider may also recommend using home test kits to check for hidden blood in the stool.  A small camera at the end of a tube can be used to examine your colon directly (sigmoidoscopy or colonoscopy). This is done to check for the earliest forms of colorectal cancer.  Routine screening usually begins at age 50.  Direct examination of the colon should be repeated every 5-10 years through 49 years of age. However, you may need to be screened more often if early forms of precancerous polyps or small growths are found. Skin Cancer  Check your skin from head to toe regularly.  Tell your health care provider about any new moles or changes in moles, especially if there is a change in a mole's shape or color.  Also tell your health care provider if you have a mole that is larger than the size of a pencil eraser.  Always use sunscreen. Apply sunscreen liberally and repeatedly throughout  the day.  Protect yourself by wearing long sleeves, pants, a wide-brimmed hat, and sunglasses whenever you are outside. Heart disease, diabetes, and high blood pressure  High blood pressure causes heart disease and increases the risk of stroke. High blood pressure is more likely to develop in:  People who have blood pressure in the high end of the normal range (130-139/85-89 mm Hg).  People who are overweight or obese.  People who are African American.  If you are 21-24 years of age, have your blood pressure checked every 3-5 years. If you are 64 years of age or older, have your blood pressure checked every year. You should have your blood pressure measured twice-once when you are at a hospital or clinic, and once when you are not at a hospital or clinic. Record the average of the two measurements. To check your blood pressure when you are not at a hospital or clinic, you can use:  An automated  blood pressure machine at a pharmacy.  A home blood pressure monitor.  If you are between 41 years and 54 years old, ask your health care provider if you should take aspirin to prevent strokes.  Have regular diabetes screenings. This involves taking a blood sample to check your fasting blood sugar level.  If you are at a normal weight and have a low risk for diabetes, have this test once every three years after 49 years of age.  If you are overweight and have a high risk for diabetes, consider being tested at a younger age or more often. Preventing infection Hepatitis B  If you have a higher risk for hepatitis B, you should be screened for this virus. You are considered at high risk for hepatitis B if:  You were born in a country where hepatitis B is common. Ask your health care provider which countries are considered high risk.  Your parents were born in a high-risk country, and you have not been immunized against hepatitis B (hepatitis B vaccine).  You have HIV or AIDS.  You use needles to inject street drugs.  You live with someone who has hepatitis B.  You have had sex with someone who has hepatitis B.  You get hemodialysis treatment.  You take certain medicines for conditions, including cancer, organ transplantation, and autoimmune conditions. Hepatitis C  Blood testing is recommended for:  Everyone born from [birth year] through 1964.  Anyone with known risk factors for hepatitis C. Sexually transmitted infections (STIs)  You should be screened for sexually transmitted infections (STIs) including gonorrhea and chlamydia if:  You are sexually active and are younger than 49 years of age.  You are older than 49 years of age and your health care provider tells you that you are at risk for this type of infection.  Your sexual activity has changed since you were last screened and you are at an increased risk for chlamydia or gonorrhea. Ask your health care provider if you are at  risk.  If you do not have HIV, but are at risk, it may be recommended that you take a prescription medicine daily to prevent HIV infection. This is called pre-exposure prophylaxis (PrEP). You are considered at risk if:  You are sexually active and do not regularly use condoms or know the HIV status of your partner(s).  You take drugs by injection.  You are sexually active with a partner who has HIV. Talk with your health care provider about whether you are at high risk of being  infected with HIV. If you choose to begin PrEP, you should first be tested for HIV. You should then be tested every 3 months for as long as you are taking PrEP. Pregnancy  If you are premenopausal and you may become pregnant, ask your health care provider about preconception counseling.  If you may become pregnant, take 400 to 800 micrograms (mcg) of folic acid every day.  If you want to prevent pregnancy, talk to your health care provider about birth control (contraception). Osteoporosis and menopause  Osteoporosis is a disease in which the bones lose minerals and strength with aging. This can result in serious bone fractures. Your risk for osteoporosis can be identified using a bone density scan.  If you are 93 years of age or older, or if you are at risk for osteoporosis and fractures, ask your health care provider if you should be screened.  Ask your health care provider whether you should take a calcium or vitamin D supplement to lower your risk for osteoporosis.  Menopause may have certain physical symptoms and risks.  Hormone replacement therapy may reduce some of these symptoms and risks. Talk to your health care provider about whether hormone replacement therapy is right for you. Follow these instructions at home:  Schedule regular health, dental, and eye exams.  Stay current with your immunizations.  Do not use any tobacco products including cigarettes, chewing tobacco, or electronic  cigarettes.  If you are pregnant, do not drink alcohol.  If you are breastfeeding, limit how much and how often you drink alcohol.  Limit alcohol intake to no more than 1 drink per day for nonpregnant women. One drink equals 12 ounces of beer, 5 ounces of wine, or 1 ounces of hard liquor.  Do not use street drugs.  Do not share needles.  Ask your health care provider for help if you need support or information about quitting drugs.  Tell your health care provider if you often feel depressed.  Tell your health care provider if you have ever been abused or do not feel safe at home. This information is not intended to replace advice given to you by your health care provider. Make sure you discuss any questions you have with your health care provider. Document Released: 08/27/2010 Document Revised: 07/20/2015 Document Reviewed: 11/15/2014  2017 [employer]vier

## 2016-02-13 LAB — PAP IG AND HPV HIGH-RISK
HPV, HIGH-RISK: NEGATIVE
PAP Smear Comment: 0

## 2016-03-08 ENCOUNTER — Encounter: Payer: Self-pay | Admitting: Physician Assistant

## 2016-03-08 ENCOUNTER — Ambulatory Visit: Payer: Self-pay | Admitting: Physician Assistant

## 2016-03-08 VITALS — BP 129/79 | HR 70 | Temp 97.8°F | Ht 62.0 in | Wt 168.0 lb

## 2016-03-08 DIAGNOSIS — R079 Chest pain, unspecified: Secondary | ICD-10-CM | POA: Insufficient documentation

## 2016-03-08 DIAGNOSIS — Z5181 Encounter for therapeutic drug level monitoring: Secondary | ICD-10-CM

## 2016-03-08 DIAGNOSIS — Z79899 Other long term (current) drug therapy: Principal | ICD-10-CM

## 2016-03-08 DIAGNOSIS — Z0000 Encounter for general adult medical examination without abnormal findings: Secondary | ICD-10-CM

## 2016-03-08 MED ORDER — BUPROPION HCL ER (XL) 150 MG PO TB24: ORAL_TABLET | ORAL | 6 refills | Status: DC

## 2016-03-08 NOTE — Progress Notes (Signed)
S: here for wellness encounter, biometric forms, recently started on simvastatin, no muscle aches or problems with medication, states she has been having an odd pain in her chest, will be a sharp pain, and feels like her heart misses a beat, no radiation of pain, no sob, remainder ros of neg+ smoker 1.5 ppd; no etoh or drug use; fam hx of CAD with her father having cabg, is on dialysis at this time, mother has hx of htn, also would like to see if we could help her quit smoking and lose weight Recent pap smear in December was normal, mammogram in April was normal Pt uses cpap machine  O: vitals wnl, nad, normocephalic, perrl eomi, tms clear, nasal mucosa red, throat wnl, neck supple no lymph, lungs c t a, cv rrr, ekg with mi of undetermined age,   A: chest pain, wellness exam, weight loss, smoking cessation  P: pt to see cardiology at 1015 as she does not look acute; wellbutrin xl 150mg  1 po qd x 2-3 weeks then increase to 2 po qd, biometric form filled out

## 2016-03-09 LAB — HEPATIC FUNCTION PANEL
ALBUMIN: 4.7 g/dL (ref 3.5–5.5)
ALK PHOS: 94 IU/L (ref 39–117)
ALT: 22 IU/L (ref 0–32)
AST: 21 IU/L (ref 0–40)
BILIRUBIN TOTAL: 0.3 mg/dL (ref 0.0–1.2)
Bilirubin, Direct: 0.07 mg/dL (ref 0.00–0.40)
TOTAL PROTEIN: 6.7 g/dL (ref 6.0–8.5)

## 2016-05-13 DIAGNOSIS — M7751 Other enthesopathy of right foot: Secondary | ICD-10-CM | POA: Insufficient documentation

## 2016-05-17 ENCOUNTER — Ambulatory Visit: Payer: Self-pay | Admitting: Physician Assistant

## 2016-05-17 VITALS — BP 130/80 | HR 77 | Temp 98.0°F

## 2016-05-17 DIAGNOSIS — R5383 Other fatigue: Secondary | ICD-10-CM

## 2016-05-17 NOTE — Progress Notes (Signed)
S: c/o fatigue, no fever/chills/cp/sob/cough/congestion, stopped taking wellbutrin about 3 weeks ago, husband had bypass surgery, his phone goes off all night, can just sit down and go to sleep, sx for about 3-4 days  O:v itals wnl, nad, ent wnl, neck supple no lymph, lungs c t a, cv rrr, neuro intact, pt appears tired  A: fatigue  P: labs today, otc b12, clean up sleep habits, wear cpap machine, consider stress as cause

## 2016-05-19 LAB — CBC WITH DIFFERENTIAL/PLATELET
BASOS: 1 %
Basophils Absolute: 0.1 10*3/uL (ref 0.0–0.2)
EOS (ABSOLUTE): 0.2 10*3/uL (ref 0.0–0.4)
EOS: 3 %
HEMATOCRIT: 42 % (ref 34.0–46.6)
Hemoglobin: 14.3 g/dL (ref 11.1–15.9)
Immature Grans (Abs): 0 10*3/uL (ref 0.0–0.1)
Immature Granulocytes: 1 %
LYMPHS ABS: 2.8 10*3/uL (ref 0.7–3.1)
Lymphs: 33 %
MCH: 29.8 pg (ref 26.6–33.0)
MCHC: 34 g/dL (ref 31.5–35.7)
MCV: 88 fL (ref 79–97)
MONOS ABS: 0.5 10*3/uL (ref 0.1–0.9)
Monocytes: 6 %
Neutrophils Absolute: 4.8 10*3/uL (ref 1.4–7.0)
Neutrophils: 56 %
Platelets: 239 10*3/uL (ref 150–379)
RBC: 4.8 x10E6/uL (ref 3.77–5.28)
RDW: 13 % (ref 12.3–15.4)
WBC: 8.4 10*3/uL (ref 3.4–10.8)

## 2016-05-19 LAB — THYROID PANEL WITH TSH
Free Thyroxine Index: 2 (ref 1.2–4.9)
T3 Uptake Ratio: 26 % (ref 24–39)
T4, Total: 7.6 ug/dL (ref 4.5–12.0)
TSH: 1.57 u[IU]/mL (ref 0.450–4.500)

## 2016-05-19 LAB — EPSTEIN-BARR VIRUS VCA ANTIBODY PANEL
EBV Early Antigen Ab, IgG: <9 U/mL (ref 0.0–8.9)
EBV NA IgG: 21.8 U/mL — ABNORMAL HIGH (ref 0.0–17.9)
EBV VCA IGG: 242 U/mL — AB (ref 0.0–17.9)
EBV VCA IgM: <36 U/mL (ref 0.0–35.9)

## 2016-05-19 LAB — B12 AND FOLATE PANEL
FOLATE: 10.3 ng/mL (ref >3.0)
Vitamin B-12: 556 pg/mL (ref 232–1245)

## 2016-05-19 LAB — VITAMIN D 25 HYDROXY (VIT D DEFICIENCY, FRACTURES): VIT D 25 HYDROXY: 36.4 ng/mL (ref 30.0–100.0)

## 2016-11-07 ENCOUNTER — Ambulatory Visit: Payer: Self-pay | Admitting: Physician Assistant

## 2016-11-07 ENCOUNTER — Encounter: Payer: Self-pay | Admitting: Physician Assistant

## 2016-11-07 VITALS — BP 150/79 | HR 90 | Temp 98.5°F | Resp 16

## 2016-11-07 DIAGNOSIS — M545 Low back pain, unspecified: Secondary | ICD-10-CM

## 2016-11-07 MED ORDER — BACLOFEN 10 MG PO TABS: 10.0000 mg | ORAL_TABLET | Freq: Three times a day (TID) | ORAL | 0 refills | Status: DC

## 2016-11-07 MED ORDER — METHYLPREDNISOLONE 4 MG PO TBPK: ORAL_TABLET | ORAL | 0 refills | Status: DC

## 2016-11-07 NOTE — Progress Notes (Signed)
S:  C/o low back pain for 1 day, +known injury, bent over to start lawn mower, is pull start, then mowed the yard with push mower, pain is worse with movement, increased with bending over, denies numbness, tingling, or changes in bowel/urinary habits,  Using otc meds without relief Remainder ros neg  O:  Vitals wnl, nad, lungs c t a, cv rrr, spine nontender, muscles in r lower back spasmed ,  Neg slr, pt walks without difficulty, no foot drop noted, n/v intact  A: acute back pain, muscle spasms  P: use wet heat followed by ice, stretches, return to clinic if not better in 3 t 5 days, return earlier if worsening, rx meds:medrol dose pack, baclofen

## 2017-01-23 ENCOUNTER — Ambulatory Visit: Payer: Self-pay | Admitting: Family

## 2017-01-23 VITALS — BP 120/79 | HR 76 | Temp 97.7°F | Resp 16

## 2017-01-23 DIAGNOSIS — J0100 Acute maxillary sinusitis, unspecified: Secondary | ICD-10-CM

## 2017-01-23 MED ORDER — AZITHROMYCIN 250 MG PO TABS: ORAL_TABLET | ORAL | 0 refills | Status: DC

## 2017-01-23 NOTE — Progress Notes (Signed)
S: Three-day history of congestion, cough, sneezing ,runny nose, hot and cold, malaise; is a smoker Objective vital signs stable, ill-appearing no acute distress ENT: Nasal mucosa swollen erythematous with mucopurulent rhinorrhea pharynx clear, and tenderness to palpation maxillary and frontal sinuses neck supple heart RSR lungs are clear deep cough noted A acute rhino sinusitis P Z-Pak, Supportive measures discussed. Follow up prn not improving

## 2017-02-11 ENCOUNTER — Encounter: Payer: Managed Care, Other (non HMO) | Admitting: Obstetrics and Gynecology

## 2017-03-04 ENCOUNTER — Encounter: Payer: Self-pay | Admitting: Emergency Medicine

## 2017-03-04 ENCOUNTER — Ambulatory Visit: Payer: Self-pay | Admitting: Emergency Medicine

## 2017-03-04 VITALS — BP 140/80 | HR 80 | Temp 98.5°F | Resp 16 | Ht 61.0 in | Wt 177.0 lb

## 2017-03-04 DIAGNOSIS — Z0000 Encounter for general adult medical examination without abnormal findings: Secondary | ICD-10-CM

## 2017-03-04 NOTE — Progress Notes (Signed)
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  Subjective   patient has no secific complaints. She would like to her mammogram scheduled She did not have any help with the Wellbutrin I stopping smokng.She does not want to quit smoking. She is on Zocor or high choleterl.  Objective.  HEENT exam unremarkable. Chest clear Heart regular ate and rhythm.  Assessment.   #1 high cholesterol.  #2 need for mammogram.  #3 nicotine addiction.  #4 obesity.  Plan.  Advised  Water  aerobics  Check labs.  Check mammogram.  Stop smoking..Marland Kitchen

## 2017-03-04 NOTE — Patient Instructions (Signed)
Would advise you to exercise.   Please stop smoking.  Work on weight reduction.

## 2017-03-05 LAB — CMP12+LP+TP+TSH+6AC+CBC/D/PLT
ALK PHOS: 97 IU/L (ref 39–117)
ALT: 16 IU/L (ref 0–32)
AST: 17 IU/L (ref 0–40)
Albumin/Globulin Ratio: 2 (ref 1.2–2.2)
Albumin: 4.5 g/dL (ref 3.5–5.5)
BASOS ABS: 0.1 10*3/uL (ref 0.0–0.2)
BUN/Creatinine Ratio: 11 (ref 9–23)
BUN: 9 mg/dL (ref 6–24)
Basos: 0 %
Bilirubin Total: 0.3 mg/dL (ref 0.0–1.2)
CALCIUM: 9.1 mg/dL (ref 8.7–10.2)
CHLORIDE: 103 mmol/L (ref 96–106)
CHOL/HDL RATIO: 7.7 ratio — AB (ref 0.0–4.4)
CHOLESTEROL TOTAL: 307 mg/dL — AB (ref 100–199)
Creatinine, Ser: 0.8 mg/dL (ref 0.57–1.00)
EOS (ABSOLUTE): 0.2 10*3/uL (ref 0.0–0.4)
EOS: 2 %
ESTIMATED CHD RISK: 2.2 times avg. — AB (ref 0.0–1.0)
FREE THYROXINE INDEX: 2.1 (ref 1.2–4.9)
GFR calc Af Amer: 99 mL/min/{1.73_m2} (ref >59)
GFR calc non Af Amer: 86 mL/min/{1.73_m2} (ref >59)
GGT: 33 IU/L (ref 0–60)
GLOBULIN, TOTAL: 2.2 g/dL (ref 1.5–4.5)
Glucose: 91 mg/dL (ref 65–99)
HDL: 40 mg/dL (ref >39)
Hematocrit: 41.6 % (ref 34.0–46.6)
Hemoglobin: 14.1 g/dL (ref 11.1–15.9)
IMMATURE GRANULOCYTES: 0 %
IRON: 83 ug/dL (ref 27–159)
Immature Grans (Abs): 0 10*3/uL (ref 0.0–0.1)
LDH: 203 IU/L (ref 119–226)
LDL Calculated: 239 mg/dL — ABNORMAL HIGH (ref 0–99)
LYMPHS ABS: 3 10*3/uL (ref 0.7–3.1)
Lymphs: 26 %
MCH: 30.3 pg (ref 26.6–33.0)
MCHC: 33.9 g/dL (ref 31.5–35.7)
MCV: 90 fL (ref 79–97)
MONOS ABS: 0.7 10*3/uL (ref 0.1–0.9)
Monocytes: 6 %
NEUTROS ABS: 7.6 10*3/uL — AB (ref 1.4–7.0)
NEUTROS PCT: 66 %
PLATELETS: 266 10*3/uL (ref 150–379)
Phosphorus: 3.1 mg/dL (ref 2.5–4.5)
Potassium: 4.1 mmol/L (ref 3.5–5.2)
RBC: 4.65 x10E6/uL (ref 3.77–5.28)
RDW: 13.4 % (ref 12.3–15.4)
Sodium: 139 mmol/L (ref 134–144)
T3 UPTAKE RATIO: 25 % (ref 24–39)
T4 TOTAL: 8.2 ug/dL (ref 4.5–12.0)
TOTAL PROTEIN: 6.7 g/dL (ref 6.0–8.5)
TRIGLYCERIDES: 140 mg/dL (ref 0–149)
TSH: 1.51 u[IU]/mL (ref 0.450–4.500)
Uric Acid: 4.6 mg/dL (ref 2.5–7.1)
VLDL Cholesterol Cal: 28 mg/dL (ref 5–40)
WBC: 11.5 10*3/uL — ABNORMAL HIGH (ref 3.4–10.8)

## 2017-03-05 LAB — VITAMIN D 25 HYDROXY (VIT D DEFICIENCY, FRACTURES): Vit D, 25-Hydroxy: 44.9 ng/mL (ref 30.0–100.0)

## 2017-03-13 ENCOUNTER — Telehealth: Payer: Self-pay | Admitting: Medical

## 2017-03-13 ENCOUNTER — Ambulatory Visit: Payer: Self-pay | Admitting: Medical

## 2017-03-13 VITALS — BP 159/80 | HR 76 | Temp 97.8°F | Resp 16

## 2017-03-13 DIAGNOSIS — R1030 Lower abdominal pain, unspecified: Secondary | ICD-10-CM

## 2017-03-13 DIAGNOSIS — R110 Nausea: Secondary | ICD-10-CM

## 2017-03-13 DIAGNOSIS — R1084 Generalized abdominal pain: Secondary | ICD-10-CM

## 2017-03-13 LAB — POCT URINALYSIS DIPSTICK
Bilirubin, UA: NEGATIVE
GLUCOSE UA: NEGATIVE
KETONES UA: NEGATIVE
Leukocytes, UA: NEGATIVE
NITRITE UA: NEGATIVE
PROTEIN UA: NEGATIVE
Urobilinogen, UA: 0.2 E.U./dL
pH, UA: 6 (ref 5.0–8.0)

## 2017-03-13 NOTE — Progress Notes (Signed)
Subjective:    Patient ID: [name], female    DOB: 1966/01/31, 52 y.o.   MRN: [id number]  HPI 5 y o female in non acute distress with abdominal pain ( all over) and diarrhea starting this last Sunday.  Last diarrhea yesterday but then started with nausea but no vomiting.  Feels bad and weak not really eating. Using Imodium last  2 days and switched to pepto yesterday. Cough nonproductive. No history of hypertension. Ate a burrito this morning.  Seen last week for a physical,for got to tell provider about her HA's  though none now.History of Headaches in the am but gets better through out the day.  History of surgery 3 discectomies and las back surgery was a lumbar fusion.Stillhas gall bladder and appendix.  Blood pressure (!) 159/80, pulse 76, temperature 97.8 F (36.6 C), temperature source Oral, resp. rate 16, SpO2 98 %.  Review of Systems  Constitutional: Positive for fatigue. Negative for chills and fever.  HENT: Negative for congestion, ear pain and sore throat.   Respiratory: Positive for cough. Negative for shortness of breath.   Cardiovascular: Negative for chest pain.  Gastrointestinal: Positive for abdominal pain, diarrhea and nausea. Negative for anal bleeding, blood in stool, constipation and vomiting.  Genitourinary: Negative for dysuria.  Skin: Negative for rash.       Objective:   Physical Exam  Constitutional: She is oriented to person, place, and time. She appears well-developed and well-nourished.  HENT:  Head: Normocephalic and atraumatic.  Eyes: Conjunctivae and EOM are normal. Pupils are equal, round, and reactive to light.  Neck: Normal range of motion. Neck supple.  Cardiovascular: Normal rate, regular rhythm and normal heart sounds.  Pulmonary/Chest: Effort normal and breath sounds normal. No respiratory distress. She has no wheezes. She has no rales.  Abdominal: Soft. She exhibits no distension and no mass. There is tenderness. There is rebound.  There is no guarding.  Neurological: She is alert and oriented to person, place, and time.  Skin: Skin is warm and dry.  Psychiatric: She has a normal mood and affect. Her behavior is normal. Judgment and thought content normal.  Vitals reviewed.   Easily lays down on the table.   Looks like she does not feel well. Obese  Urine dip  Assessment & Plan:  Abdominal pain  With rebound tenderness right side.  8/10. Will do CT of abdomen/ pelvis. Urine dip 1+blood , urine culture sent off. CBC and met C ordered. Patient verbalizes understanding of plan and has no questions at this time. Will contact patient with lab results.

## 2017-03-13 NOTE — Patient Instructions (Signed)
Gastritis, Adult  Gastritis is swelling (inflammation) of the stomach. When you have this condition, you can have these problems (symptoms):  ? Pain in your stomach.  ? A burning feeling in your stomach.  ? Feeling sick to your stomach (nauseous).  ? Throwing up (vomiting).  ? Feeling too full after you eat.  It is important to get help for this condition. Without help, your stomach can bleed, and you can get sores (ulcers) in your stomach.  Follow these instructions at home:  ? Take over-the-counter and prescription medicines only as told by your doctor.  ? If you were prescribed an antibiotic medicine, take it as told by your doctor. Do not stop taking it even if you start to feel better.  ? Drink enough fluid to keep your pee (urine) clear or pale yellow.  ? Instead of eating big meals, eat small meals often.  Contact a health care provider if:  ? Your problems get worse.  ? Your problems go away and then come back.  Get help right away if:  ? You throw up blood or something that looks like coffee grounds.  ? You have black or dark red poop (stools).  ? You cannot keep fluids down.  ? Your stomach pain gets worse.  ? You have a fever.  ? You do not feel better after 1 week.  This information is not intended to replace advice given to you by your health care provider. Make sure you discuss any questions you have with your health care provider.  Document Released: 07/31/2007 Document Revised: 10/11/2015 Document Reviewed: 11/05/2014  Elsevier Interactive Patient Education ? 2018 [employer]

## 2017-03-13 NOTE — Telephone Encounter (Signed)
Spoke to patient she said that she did not get CT, she spoke with Catha BrowMonique Deacon CMA and wasn't sure what Gabriel RungMonique said , because of her phone or in a  bad area for reception. She was going to call Monique back but then got busy in a meeting.  Pain slightly better but not much. Reviewed with patient s/s of worsening abdominal pain, vomiting, etc. and to [facility] if they occur. Otherwise she will call Monique in the morning.  Patient verbalizes understanding and has no questions at the end of our phone conversation.

## 2017-03-13 NOTE — Progress Notes (Signed)
1+ blood , will send off for culture

## 2017-03-14 LAB — COMPREHENSIVE METABOLIC PANEL
A/G RATIO: 2 (ref 1.2–2.2)
ALK PHOS: 102 IU/L (ref 39–117)
ALT: 21 IU/L (ref 0–32)
AST: 23 IU/L (ref 0–40)
Albumin: 4.7 g/dL (ref 3.5–5.5)
BILIRUBIN TOTAL: 0.3 mg/dL (ref 0.0–1.2)
BUN/Creatinine Ratio: 12 (ref 9–23)
BUN: 10 mg/dL (ref 6–24)
CHLORIDE: 102 mmol/L (ref 96–106)
CO2: 24 mmol/L (ref 20–29)
Calcium: 9.9 mg/dL (ref 8.7–10.2)
Creatinine, Ser: 0.81 mg/dL (ref 0.57–1.00)
GFR calc non Af Amer: 85 mL/min/{1.73_m2} (ref >59)
GFR, EST AFRICAN AMERICAN: 98 mL/min/{1.73_m2} (ref >59)
GLUCOSE: 80 mg/dL (ref 65–99)
Globulin, Total: 2.3 g/dL (ref 1.5–4.5)
Potassium: 4.7 mmol/L (ref 3.5–5.2)
Sodium: 144 mmol/L (ref 134–144)
Total Protein: 7 g/dL (ref 6.0–8.5)

## 2017-03-14 LAB — CBC WITH DIFFERENTIAL/PLATELET
BASOS: 1 %
Basophils Absolute: 0.1 10*3/uL (ref 0.0–0.2)
EOS (ABSOLUTE): 0.2 10*3/uL (ref 0.0–0.4)
Eos: 2 %
Hematocrit: 44.7 % (ref 34.0–46.6)
Hemoglobin: 14.9 g/dL (ref 11.1–15.9)
Immature Grans (Abs): 0 10*3/uL (ref 0.0–0.1)
Immature Granulocytes: 0 %
Lymphocytes Absolute: 2.9 10*3/uL (ref 0.7–3.1)
Lymphs: 29 %
MCH: 30.2 pg (ref 26.6–33.0)
MCHC: 33.3 g/dL (ref 31.5–35.7)
MCV: 91 fL (ref 79–97)
MONOS ABS: 0.6 10*3/uL (ref 0.1–0.9)
Monocytes: 6 %
NEUTROS ABS: 6.3 10*3/uL (ref 1.4–7.0)
Neutrophils: 62 %
PLATELETS: 291 10*3/uL (ref 150–379)
RBC: 4.94 x10E6/uL (ref 3.77–5.28)
RDW: 13.3 % (ref 12.3–15.4)
WBC: 10.1 10*3/uL (ref 3.4–10.8)

## 2017-03-15 LAB — URINE CULTURE

## 2017-03-17 ENCOUNTER — Ambulatory Visit
Admission: RE | Admit: 2017-03-17 | Discharge: 2017-03-17 | Disposition: A | Discharge status: Home / Self Care | Payer: Managed Care, Other (non HMO) | Source: Ambulatory Visit | Attending: Medical | Admitting: Medical

## 2017-03-17 DIAGNOSIS — K429 Umbilical hernia without obstruction or gangrene: Secondary | ICD-10-CM | POA: Diagnosis not present

## 2017-03-17 DIAGNOSIS — I700 Atherosclerosis of aorta: Secondary | ICD-10-CM | POA: Insufficient documentation

## 2017-03-17 DIAGNOSIS — R1084 Generalized abdominal pain: Secondary | ICD-10-CM | POA: Insufficient documentation

## 2017-03-17 DIAGNOSIS — I2510 Atherosclerotic heart disease of native coronary artery without angina pectoris: Secondary | ICD-10-CM | POA: Insufficient documentation

## 2017-03-17 MED ORDER — IOPAMIDOL (ISOVUE-300) INJECTION 61%
100.0000 mL | Freq: Once | INTRAVENOUS | Status: AC | PRN
  Administered 2017-03-17: 100 mL via INTRAVENOUS

## 2017-03-18 ENCOUNTER — Telehealth: Payer: Self-pay | Admitting: Medical

## 2017-03-18 NOTE — Progress Notes (Signed)
Please refer patient to GI for Normal liver size. Numerous (greater than 10) hypodense subcentimeter lesions scattered throughout the liver, too small to characterize, at least some of which were not present on prior noncontrast Ct. TY

## 2017-03-18 NOTE — Telephone Encounter (Signed)
Called patient , no answer, left message to return call.  Patient immediately called back , reviewed CT with patient and will refer her to GI. No abdominal pain now , has a small amount yesterday. Nausea better. Discussed with Dr. Sullivan LoneGilbert and he agrees.GI referral.  Hepatobiliary: Normal liver size. Numerous (greater than 10) hypodense subcentimeter lesions scattered throughout the liver, too small to characterize, at least some of which were not present on prior noncontrast CT. Normal gallbladder with no radiopaque cholelithiasis. No biliary ductal dilatation.  Adrenals/Urinary Tract: Normal adrenals. Stable chronic moderate asymmetric right renal parenchymal scarring and atrophy. No hydronephrosis. Punctate calcifications in the interpolar left renal sinus, favor vascular calcifications. No renal masses. Normal Bladder.  IMPRESSION: 1. No acute abnormality. No evidence of bowel obstruction or acute bowel inflammation. No evidence of acute appendicitis. 2. Stable small fat containing umbilical hernia. 3. Coronary atherosclerosis. 4.  Aortic Atherosclerosis (ICD10-  Patient to call clinic if she needs anything. Urine culture negative. Growth.

## 2017-03-27 ENCOUNTER — Telehealth: Payer: Self-pay | Admitting: Medical

## 2017-03-27 NOTE — Telephone Encounter (Signed)
Called patient to see how she was feeling. If she had an appointment with GI scheduled yet.  Also to let her know about her Atherosclerosis on CT -Sullivan LoneGilbert had reviewed and to let her know we are referring her to Cardiology.  Left message 1/30 and 03/27/2017

## 2017-04-02 NOTE — Progress Notes (Signed)
Please call patient and say Dr. Sullivan LoneGilbert reviewed CT she has cardiovascular disease. Please refer to a cardiologist for evalutaion. I have attempted to contact her twice with the number in demographic without success. TY

## 2017-04-23 ENCOUNTER — Other Ambulatory Visit: Payer: Self-pay

## 2017-04-23 DIAGNOSIS — I700 Atherosclerosis of aorta: Secondary | ICD-10-CM

## 2017-04-23 NOTE — Progress Notes (Signed)
To Call patient and let her know she has CVD and needs eval by cardiology. Refer her to cardiologist , I like Dr. Kirke CorinArida. Thank you.

## 2017-04-24 ENCOUNTER — Telehealth: Payer: Self-pay | Admitting: Medical

## 2017-04-24 NOTE — Telephone Encounter (Signed)
Reviewed reason with patient for Cardiology review per her CT. Had reviewed with Dr. Sullivan LoneGilbert. She shares with me she had a stress test with Dr. Darrold JunkerParaschos about a year ago and she states she feels she had trouble with the stress test due to her having "a bad knee" and having trouble walking. She also shares with me that she does have heart disease in her family history.Reviewed with patient if she has not heard from Cardiology within one week to contact the [facility] office so that we may follow up on her appointment. She verbalizes understanding and has no questions at discharge.

## 2018-01-02 ENCOUNTER — Ambulatory Visit: Payer: Self-pay | Admitting: Family Medicine

## 2018-01-02 ENCOUNTER — Encounter: Payer: Self-pay | Admitting: Family Medicine

## 2018-01-02 VITALS — BP 144/80 | Ht 62.0 in | Wt 174.0 lb

## 2018-01-02 DIAGNOSIS — Z0189 Encounter for other specified special examinations: Principal | ICD-10-CM

## 2018-01-02 DIAGNOSIS — Z008 Encounter for other general examination: Secondary | ICD-10-CM

## 2018-01-02 NOTE — Progress Notes (Signed)
Subjective: Annual biometrics screening  Patient presents for her annual biometric screening. Patient denies eating a healthy, well-rounded diet or getting regular physical activity.  Patient does not currently have a primary care provider.   Patient denies any other issues, symptoms, or concerns.   Review of Systems Unremarkable  Objective  Physical Exam General: Awake, alert and oriented. No acute distress. Well developed, hydrated and nourished. Appears stated age.  HEENT: Supple neck without adenopathy. Sclera is non-icteric. The ear canal is clear without discharge. The tympanic membrane is normal in appearance with normal landmarks and cone of light. Nasal mucosa is pink and moist. Oral mucosa is pink and moist. The pharynx is normal in appearance without tonsillar swelling or exudates.  Skin: Skin in warm, dry and intact without rashes or lesions. Appropriate color for ethnicity. Cardiac: Heart rate and rhythm are normal. No murmurs, gallops, or rubs are auscultated.  Respiratory: The chest wall is symmetric and without deformity. No signs of respiratory distress. Lung sounds are clear in all lobes bilaterally without rales, ronchi, or wheezes.  Neurological: The patient is awake, alert and oriented to person, place, and time with normal speech.  Memory is normal and thought processes intact. No gait abnormalities are appreciated.  Psychiatric: Appropriate mood and affect.   Assessment Annual biometrics screening  Plan  Lipid panel and fasting blood sugar pending. Encouraged routine visits with primary care provider.  Provided patient with a list of local resources and encouraged her to call today to establish care with a new primary care provider as soon as possible. Patient's blood pressure is 144/80 today.  Discussed normal values.  Advised patient to monitor this daily, keep a log, and follow-up with her primary care provider regarding this at her first visit.  Encouraged patient  to get regular exercise and eat a healthy, well-rounded diet.  Extensively discussed individualized lifestyle measures that would be beneficial for the patient.

## 2018-01-03 LAB — LIPID PANEL
Chol/HDL Ratio: 7.9 ratio — ABNORMAL HIGH (ref 0.0–4.4)
Cholesterol, Total: 300 mg/dL — ABNORMAL HIGH (ref 100–199)
HDL: 38 mg/dL — ABNORMAL LOW (ref >39)
LDL CALC: 220 mg/dL — AB (ref 0–99)
Triglycerides: 210 mg/dL — ABNORMAL HIGH (ref 0–149)
VLDL CHOLESTEROL CAL: 42 mg/dL — AB (ref 5–40)

## 2018-01-03 LAB — GLUCOSE, RANDOM: GLUCOSE: 93 mg/dL (ref 65–99)

## 2018-01-05 NOTE — Progress Notes (Signed)
Dear Ms. Marina Goodell, I wanted to let you know that your lipid panel and fasting blood sugar came back.  Your fasting blood sugar is normal.  Your total cholesterol is elevated at 300, normal values are between 100 and 199.  Your triglyceride level is elevated at 210, normal values are between 0 and 149.  Your VLDL cholesterol is elevated at 42, normal values are between 5 and 40. Your HDL cholesterol ("good cholesterol") is decreased at 38, normal values are above 39.  Your LDL cholesterol ("bad cholesterol") is elevated at 220, normal values are below 99.  Your cholesterol/HDL ratio is elevated at 7.9, normal values are between 0 and 4.4 for women or 0 and 5 from men.  These abnormal values increase your risk for cardiovascular disease.  I want you to follow-up with your primary care provider regarding these results within the next 2 weeks, as previously mentioned.

## 2018-01-05 NOTE — Progress Notes (Signed)
Labs for Biometric have been recorded on the form and faxed to [facility] 01/05/18 [facility] Olyver Hawes CMA

## 2018-01-27 ENCOUNTER — Ambulatory Visit: Payer: Self-pay | Admitting: Emergency Medicine

## 2018-01-27 VITALS — BP 142/68 | HR 76 | Temp 97.7°F | Resp 14 | Ht 62.0 in | Wt 174.0 lb

## 2018-01-27 DIAGNOSIS — M549 Dorsalgia, unspecified: Secondary | ICD-10-CM

## 2018-01-27 DIAGNOSIS — M5489 Other dorsalgia: Secondary | ICD-10-CM

## 2018-01-27 DIAGNOSIS — R011 Cardiac murmur, unspecified: Secondary | ICD-10-CM | POA: Insufficient documentation

## 2018-01-27 LAB — POCT URINALYSIS DIPSTICK
BILIRUBIN UA: NEGATIVE
GLUCOSE UA: NEGATIVE
Ketones, UA: NEGATIVE
Leukocytes, UA: NEGATIVE
Nitrite, UA: NEGATIVE
Protein, UA: NEGATIVE
Spec Grav, UA: <=1.005 — AB (ref 1.010–1.025)
Urobilinogen, UA: 0.2 E.U./dL
pH, UA: 6 (ref 5.0–8.0)

## 2018-01-27 MED ORDER — CYCLOBENZAPRINE HCL 5 MG PO TABS: 5.0000 mg | ORAL_TABLET | Freq: Three times a day (TID) | ORAL | 0 refills | Status: DC | PRN

## 2018-01-27 MED ORDER — TRAMADOL HCL 50 MG PO TABS: 50.0000 mg | ORAL_TABLET | Freq: Three times a day (TID) | ORAL | 0 refills | Status: DC | PRN

## 2018-01-27 NOTE — Progress Notes (Signed)
Subjective: Patient states that 3 days ago she developed pain in her lower back.  It is a aching type sensation.  She has a history of 4 previous back surgeries the last being 4 years ago by Dr. Jeral FruitBotero.  She does not have chronic back pain.  She has been resting in an awkward position in bed while she watches TV and feels this may have precipitated her episode.  She has been taking intermittent Aleve and Advil but not on a regular basis.  She does have a family doctor with an appointment scheduled for December 17.  She does not have any urinary symptoms.  She denies any bowel or bladder symptoms. Review of systems: History of 4 previous back surgeries none recently. Objective: Alert cooperative in no distress. There is a well-healed scar over the lower lumbar spine. Back exam reveals decreased flexion extension and rotation of the back.  Straight leg raising is negative to 90 degrees.  Deep tendon reflexes are 2+ and symmetrical.  Motor strength 5 out of 5 all muscle groups.  Patient does move slowly due to back discomfort. UA showed trace blood otherwise unremarkable. Assessment: Patient has what appears to be an LS strain.  She has had 4 previous back surgeries.  She has not been on any chronic pain medications.  I did check PMP aware and she has had no prescriptions in the last 3 years. Plan: Flexeril 5 mg 3 times daily. Aleve 1 to 2 tablets every 12 hours with food. Ultram 50 mg every 6 for severe pain number 12 tablets.

## 2018-01-27 NOTE — Patient Instructions (Signed)
Take 1-2 Aleve twice a day. Take Flexeril up to 3 times a day for spasm. Do not drive while on medication. Take Ultram if needed for severe pain. Follow-up with your family doctor.   Back Pain, Adult Back pain is very common. The pain often gets better over time. The cause of back pain is usually not dangerous. Most people can learn to manage their back pain on their own. Follow these instructions at home: Watch your back pain for any changes. The following actions may help to lessen any pain you are feeling:  Stay active. Start with short walks on flat ground if you can. Try to walk farther each day.  Exercise regularly as told by your doctor. Exercise helps your back heal faster. It also helps avoid future injury by keeping your muscles strong and flexible.  Do not sit, drive, or stand in one place for more than 30 minutes.  Do not stay in bed. Resting more than 1-2 days can slow down your recovery.  Be careful when you bend or lift an object. Use good form when lifting: ? Bend at your knees. ? Keep the object close to your body. ? Do not twist.  Sleep on a firm mattress. Lie on your side, and bend your knees. If you lie on your back, put a pillow under your knees.  Take medicines only as told by your doctor.  Put ice on the injured area. ? Put ice in a plastic bag. ? Place a towel between your skin and the bag. ? Leave the ice on for 20 minutes, 2-3 times a day for the first 2-3 days. After that, you can switch between ice and heat packs.  Avoid feeling anxious or stressed. Find good ways to deal with stress, such as exercise.  Maintain a healthy weight. Extra weight puts stress on your back.  Contact a doctor if:  You have pain that does not go away with rest or medicine.  You have worsening pain that goes down into your legs or buttocks.  You have pain that does not get better in one week.  You have pain at night.  You lose weight.  You have a fever or  chills. Get help right away if:  You cannot control when you poop (bowel movement) or pee (urinate).  Your arms or legs feel weak.  Your arms or legs lose feeling (numbness).  You feel sick to your stomach (nauseous) or throw up (vomit).  You have belly (abdominal) pain.  You feel like you may pass out (faint). This information is not intended to replace advice given to you by your health care provider. Make sure you discuss any questions you have with your health care provider. Document Released: 07/31/2007 Document Revised: 07/20/2015 Document Reviewed: 06/15/2013 Elsevier Interactive Patient Education  [employer]2018 [employer].

## 2018-02-10 ENCOUNTER — Ambulatory Visit (INDEPENDENT_AMBULATORY_CARE_PROVIDER_SITE_OTHER): Payer: Managed Care, Other (non HMO) | Admitting: Nurse Practitioner

## 2018-02-10 ENCOUNTER — Other Ambulatory Visit: Payer: Self-pay

## 2018-02-10 ENCOUNTER — Encounter: Payer: Self-pay | Admitting: Nurse Practitioner

## 2018-02-10 VITALS — BP 161/73 | HR 74 | Temp 98.0°F | Ht 60.0 in | Wt 173.4 lb

## 2018-02-10 DIAGNOSIS — E782 Mixed hyperlipidemia: Secondary | ICD-10-CM | POA: Diagnosis not present

## 2018-02-10 DIAGNOSIS — Z23 Encounter for immunization: Secondary | ICD-10-CM

## 2018-02-10 DIAGNOSIS — Z7689 Persons encountering health services in other specified circumstances: Secondary | ICD-10-CM

## 2018-02-10 DIAGNOSIS — I10 Essential (primary) hypertension: Secondary | ICD-10-CM

## 2018-02-10 MED ORDER — ROSUVASTATIN CALCIUM 10 MG PO TABS: 10.0000 mg | ORAL_TABLET | Freq: Every day | ORAL | 1 refills | Status: DC

## 2018-02-10 MED ORDER — LISINOPRIL 10 MG PO TABS: 10.0000 mg | ORAL_TABLET | Freq: Every day | ORAL | 1 refills | Status: DC

## 2018-02-10 NOTE — Progress Notes (Signed)
Subjective:    Patient ID: [name], female    DOB: 1965-04-25, 52 y.o.   MRN: [id number]  [name] is a 52 y.o. female presenting on 02/10/2018 for Establish Care (hyperlipidemia )   HPI Establish Care New Provider Pt last seen by PCP Greig Right at [facility] clinic about 1 year ago.  Obtain records from [employer].   Hyperlipidemia  Biometric screening 01/02/2018:  - Has had elevated cholesterol for last 2-3 years per patient memory.  Hypertension - She is not checking BP at home or outside of clinic.    - Current medications: none - She is not currently symptomatic. - Pt denies headache, lightheadedness, dizziness, changes in vision, chest tightness/pressure, palpitations, leg swelling, sudden loss of speech or loss of consciousness. - She  reports no regular exercise routine. - Her diet is moderate in salt, moderate in fat, and moderate in carbohydrates.   Past Medical History:  Diagnosis Date  . Anxiety   . Fatigue   . Hyperlipemia   . Irregular menstrual cycle   . Obesity    Past Surgical History:  Procedure Laterality Date  . BACK SURGERY     x 4   Social History   Socioeconomic History  . Marital status: Married    Spouse name: Not on file  . Number of children: Not on file  . Years of education: Not on file  . Highest education level: High school graduate  Occupational History  . Not on file  Social Needs  . Financial resource strain: Not very hard  . Food insecurity:    Worry: Never true    Inability: Never true  . Transportation needs:    Medical: No    Non-medical: No  Tobacco Use  . Smoking status: Current Every Day Smoker    Packs/day: 1.50    Years: 25.00    Pack years: 37.50    Types: Cigarettes  . Smokeless tobacco: Never Used  Substance and Sexual Activity  . Alcohol use: No    Alcohol/week: 0.0 standard drinks  . Drug use: No  . Sexual activity: Yes    Birth control/protection: Post-menopausal  Lifestyle    . Physical activity:    Days per week: 0 days    Minutes per session: 0 min  . Stress: Only a little  Relationships  . Social connections:    Talks on phone: Not on file    Gets together: Not on file    Attends religious service: Not on file    Active member of club or organization: Not on file    Attends meetings of clubs or organizations: Not on file    Relationship status: Not on file  . Intimate partner violence:    Fear of current or ex partner: No    Emotionally abused: No    Physically abused: No    Forced sexual activity: No  Other Topics Concern  . Not on file  Social History Narrative  . Not on file   Family History  Problem Relation Age of Onset  . Heart disease Father   . Hypertension Father   . Thyroid disease Father   . Heart attack Father 66  . CAD Father   . Kidney failure Father   . Hypertension Mother   . Heart failure Maternal Grandmother   . Lung cancer Maternal Grandfather   . Cancer Paternal Grandmother   . Coronary artery disease Paternal Grandmother   . Cancer Paternal  Grandfather   . Diabetes Neg Hx    Current Outpatient Medications on File Prior to Visit  Medication Sig  . cyclobenzaprine (FLEXERIL) 5 MG tablet Take 1 tablet (5 mg total) by mouth 3 (three) times daily as needed for muscle spasms. (Patient not taking: Reported on 02/10/2018)  . traMADol (ULTRAM) 50 MG tablet Take 1 tablet (50 mg total) by mouth every 8 (eight) hours as needed. (Patient not taking: Reported on 02/10/2018)   No current facility-administered medications on file prior to visit.     Review of Systems Per HPI unless specifically indicated above     Objective:    BP (!) 161/73 (BP Location: Right Arm, Patient Position: Sitting, Cuff Size: Normal)   Pulse 74   Temp 98 F (36.7 C) (Oral)   Ht 5' (1.524 m)   Wt 173 lb 6.4 oz (78.7 kg)   BMI 33.86 kg/m   Wt Readings from Last 3 Encounters:  02/10/18 173 lb 6.4 oz (78.7 kg)  01/27/18 174 lb (78.9 kg)   01/02/18 174 lb (78.9 kg)    Physical Exam Vitals signs reviewed.  Constitutional:      General: She is awake. She is not in acute distress.    Appearance: She is well-developed.  HENT:     Head: Normocephalic and atraumatic.  Neck:     Musculoskeletal: Normal range of motion and neck supple.     Vascular: No carotid bruit.  Cardiovascular:     Rate and Rhythm: Normal rate and regular rhythm.     Pulses:          Radial pulses are 2+ on the right side and 2+ on the left side.       Posterior tibial pulses are 1+ on the right side and 1+ on the left side.     Heart sounds: Normal heart sounds, S1 normal and S2 normal.  Pulmonary:     Effort: Pulmonary effort is normal. No respiratory distress.     Breath sounds: Normal breath sounds and air entry.  Musculoskeletal:     Right lower leg: No edema.     Left lower leg: No edema.  Skin:    General: Skin is warm and dry.  Neurological:     Mental Status: She is alert and oriented to person, place, and time.  Psychiatric:        Attention and Perception: Attention normal.        Mood and Affect: Mood and affect normal.        Behavior: Behavior normal. Behavior is cooperative.    Results for orders placed or performed in visit on 01/27/18  POCT Urinalysis Dipstick (CPT 81002)  Result Value Ref Range   Color, UA Yellow    Clarity, UA Clear    Glucose, UA Negative Negative   Bilirubin, UA Negative    Ketones, UA Negative    Spec Grav, UA <=1.005 (A) 1.010 - 1.025   Blood, UA Trace Intact    pH, UA 6.0 5.0 - 8.0   Protein, UA Negative Negative   Urobilinogen, UA 0.2 0.2 or 1.0 E.U./dL   Nitrite, UA Negative    Leukocytes, UA Negative Negative   Appearance     Odor        Assessment & Plan:   Problem List Items Addressed This Visit      Cardiovascular and Mediastinum   Essential hypertension    Uncontrolled hypertension.  BP goal < 130/80.  Pt is not currently  working on lifestyle modifications.  Taking medications  tolerating well without side effects. Complications - mor  Plan: 1. START lisinopril 10 mg once daily.  Discussed possible side effects of angioedema (rare), cough (common and reversible), kidney damage (rare, increase monitoring with start). 2. Obtain labs today and 2 weeks.  3. Encouraged heart healthy diet and increasing exercise to 30 minutes most days of the week. 4. Check BP 1-2 x per week at home, keep log, and bring to clinic at next appointment. 5. Follow up 6 weeks.        Relevant Medications   rosuvastatin (CRESTOR) 10 MG tablet   lisinopril (PRINIVIL,ZESTRIL) 10 MG tablet   Other Relevant Orders   Basic Metabolic Panel (BMET)   Basic Metabolic Panel (BMET)     Other   Mixed hyperlipidemia - Primary    Uncontrolled hyperlipidemia.  Not currently taking medication. No new ASCVD events since last visit.   - No personal history of prior ASCVD events - Family history of ASCVD events/atherosclerosis.  Plan: 1. START taking rosuvastatin 10 mg once daily. - Reviewed common side effects of myalgia (reversible off med), less common side effects of cognitive impairment (reversible off med), increased glucose, rhabdomyolysis. 2. Discussed low glycemic diet. - handout provided 3. Discussed increasing exercise to 30 minutes most days of the week. 4. Follow-up with labs in about 3 months.      Relevant Medications   rosuvastatin (CRESTOR) 10 MG tablet   lisinopril (PRINIVIL,ZESTRIL) 10 MG tablet    Other Visit Diagnoses    Encounter to establish care     [facility]  [facility]  Past medical, family, and surgical history reviewed w/ pt.     Need for diphtheria-tetanus-pertussis (Tdap) vaccine       Relevant Orders   Tdap vaccine greater than or equal to 7yo IM      Meds ordered this encounter  Medications  . rosuvastatin (CRESTOR) 10 MG tablet    Sig: Take 1 tablet (10 mg total) by mouth daily.    Dispense:  90  tablet    Refill:  1    Order Specific Question:   Supervising Provider    Answer:   Smitty CordsKARAMALEGOS, ALEXANDER J [[id number]]  . lisinopril (PRINIVIL,ZESTRIL) 10 MG tablet    Sig: Take 1 tablet (10 mg total) by mouth daily.    Dispense:  90 tablet    Refill:  1    Order Specific Question:   Supervising Provider    Answer:   Smitty CordsKARAMALEGOS, ALEXANDER J [[id number]]    Follow up plan: Return in about 6 weeks (around 03/24/2018) for hypertension.  Wilhelmina McardleLauren Adelena Desantiago, DNP, AGPCNP-BC Adult Gerontology Primary Care Nurse Practitioner [facility] 02/10/2018, 11:44 AM

## 2018-02-10 NOTE — Patient Instructions (Addendum)
[name],   Thank you for coming in to clinic today.  1. Start with a food log for 1-2 weeks for baseline.  Then, look at places to make better choices.  - Repeat your food log for 1-2 weeks with healthier choices. - Stop food log whenever you feel confident to make healthier choices.  2. START measuring your portions with your baseline for 1-2 days to see how many servings you were eating. - Then, measure your healthier portions.  If you need more food on your plate after measuring, fill it with veggies instead of starches/carbs and meats.  3. Start rosuvastatin 10 mg once daily.   - Watch for muscle cramps and call if these occur. You may stop the medication, but let me know.  4. START lisinopril 10 mg once daily.   Some of the possible side effects are:  - angioedema: swelling of lips, mouth, and tongue.  If this rare side effect occurs, please go to ED. - cough: you could develop a dry, hacking cough caused by this medicine.  If it occurs, it will go away after stopping this medicine.  Call the clinic before stopping the medication. - kidney damage: we will monitor your labs when we start this medicine and at least one time per year.  If you do not have an change in kidney function when starting this medicine, it will provide kidney protection over time.  Please schedule a follow-up appointment with Wilhelmina Mcardle, AGNP. Return in about 6 weeks (around 03/24/2018) for hypertension.  If you have any other questions or concerns, please feel free to call the clinic or send a message through MyChart. You may also schedule an earlier appointment if necessary.  You will receive a survey after today's visit either digitally by e-mail or paper by [employer]. Your experiences and feedback matter to [place].  Please respond so we know how we are doing as we provide care for you.   Wilhelmina Mcardle, DNP, AGNP-BC Adult Gerontology Nurse Practitioner [facility], [facility]    Serving  Sizes A serving size is a measured amount of food or drink, such as one slice of bread, that has an associated nutrient content. Knowing the serving size of a food or drink can help you determine how much of that food you should consume. What is the size of one serving? The size of one healthy serving depends on the food or drink. To determine a serving size, read the food label. If the food or drink does not have a food label, try to find serving size information online. Or, use the following to estimate the size of one adult serving: Grain 1 slice bread.  bagel.  cup pasta. Vegetable  cup cooked or canned vegetables. 1 cup raw, leafy greens. Fruit  cup canned fruit. 1 medium fruit.  cup dried fruit. Meat and Other Protein Sources 1 oz meat, poultry, or fish.  cup cooked beans. 1 egg.  cup nuts or seeds. 1 Tbsp nut butter.  cup tofu or tempeh. 2 Tbsp hummus. Dairy An individual container of yogurt (6-8 oz). 1 piece of cheese the size of your thumb (1 oz). 1 cup (8 oz) milk or milk alternative. Fat A piece the size of one dice. 1 tsp soft margarine. 1 Tbsp mayonnaise. 1 tsp vegetable oil. 1 Tbsp regular salad dressing. 2 Tbsp low-fat salad dressing. How many servings should I eat from each food group each day? The following are the suggested number  of servings to try and have every day from each food group. You can also look at your eating throughout the week and aim for meeting these requirements on most days for overall healthy eating. Grain 6-8 servings. Try to have half of your grains from whole grains, such as whole wheat bread, corn tortillas, oatmeal, brown rice, whole wheat pasta, and bulgur. Vegetable At least 2-3 servings. Fruit 2 servings. Meat and Other Protein Foods 5-6 servings. Aim to have lean proteins, such as chicken, [place], fish, beans, or tofu. Dairy 3 servings. Choose low-fat or nonfat if you are trying to control your weight. Fat 2-3 servings. Is a  serving the same thing as a portion? No. A portion is the actual amount you eat, which may be more than one serving. Knowing the specific serving size of a food and the nutritional information that goes with it can help you make a healthy decision on what size portion to eat. What are some tips to help me learn healthy serving sizes?  Check food labels for serving sizes. Many foods that come as a single portion actually contain multiple servings.  Determine the serving size of foods you commonly eat and figure out how large a portion you usually eat.  Measure the number of servings that can be held by the bowls, glasses, cups, and plates you typically use. For example, pour your breakfast cereal into your regular bowl and then pour it into a measuring cup.  For 1-2 days, measure the serving sizes of all the foods you eat.  Practice estimating serving sizes and determining how big your portions should be. This information is not intended to replace advice given to you by your health care provider. Make sure you discuss any questions you have with your health care provider. Document Released: 11/10/2002 Document Revised: 10/07/2015 Document Reviewed: 05/11/2013 Elsevier Interactive Patient Education  [employer]2018 Elsevier Inc.

## 2018-02-11 ENCOUNTER — Encounter: Payer: Self-pay | Admitting: Nurse Practitioner

## 2018-02-11 DIAGNOSIS — Z23 Encounter for immunization: Secondary | ICD-10-CM | POA: Diagnosis not present

## 2018-02-11 DIAGNOSIS — I10 Essential (primary) hypertension: Secondary | ICD-10-CM | POA: Insufficient documentation

## 2018-02-11 NOTE — Assessment & Plan Note (Signed)
Uncontrolled hypertension.  BP goal < 130/80.  Pt is not currently working on lifestyle modifications.  Taking medications tolerating well without side effects. Complications - mor  Plan: 1. START lisinopril 10 mg once daily.  Discussed possible side effects of angioedema (rare), cough (common and reversible), kidney damage (rare, increase monitoring with start). 2. Obtain labs today and 2 weeks.  3. Encouraged heart healthy diet and increasing exercise to 30 minutes most days of the week. 4. Check BP 1-2 x per week at home, keep log, and bring to clinic at next appointment. 5. Follow up 6 weeks.

## 2018-02-11 NOTE — Assessment & Plan Note (Signed)
Uncontrolled hyperlipidemia.  Not currently taking medication. No new ASCVD events since last visit.   - No personal history of prior ASCVD events - Family history of ASCVD events/atherosclerosis.  Plan: 1. START taking rosuvastatin 10 mg once daily. - Reviewed common side effects of myalgia (reversible off med), less common side effects of cognitive impairment (reversible off med), increased glucose, rhabdomyolysis. 2. Discussed low glycemic diet. - handout provided 3. Discussed increasing exercise to 30 minutes most days of the week. 4. Follow-up with labs in about 3 months.

## 2018-03-18 ENCOUNTER — Other Ambulatory Visit: Payer: Self-pay

## 2018-03-18 DIAGNOSIS — I10 Essential (primary) hypertension: Secondary | ICD-10-CM

## 2018-03-19 LAB — BASIC METABOLIC PANEL
BUN/Creatinine Ratio: 16 (ref 9–23)
BUN: 11 mg/dL (ref 6–24)
CO2: 24 mmol/L (ref 20–29)
Calcium: 9.6 mg/dL (ref 8.7–10.2)
Chloride: 100 mmol/L (ref 96–106)
Creatinine, Ser: 0.7 mg/dL (ref 0.57–1.00)
GFR calc Af Amer: 116 mL/min/{1.73_m2} (ref >59)
GFR calc non Af Amer: 101 mL/min/{1.73_m2} (ref >59)
Glucose: 86 mg/dL (ref 65–99)
Potassium: 4.8 mmol/L (ref 3.5–5.2)
Sodium: 144 mmol/L (ref 134–144)

## 2018-03-23 ENCOUNTER — Ambulatory Visit (INDEPENDENT_AMBULATORY_CARE_PROVIDER_SITE_OTHER): Payer: Managed Care, Other (non HMO) | Admitting: Nurse Practitioner

## 2018-03-23 ENCOUNTER — Encounter: Payer: Self-pay | Admitting: Nurse Practitioner

## 2018-03-23 VITALS — BP 155/72 | HR 73 | Resp 16 | Ht 60.0 in | Wt 176.8 lb

## 2018-03-23 DIAGNOSIS — I10 Essential (primary) hypertension: Secondary | ICD-10-CM

## 2018-03-23 DIAGNOSIS — F419 Anxiety disorder, unspecified: Secondary | ICD-10-CM

## 2018-03-23 DIAGNOSIS — E782 Mixed hyperlipidemia: Secondary | ICD-10-CM | POA: Diagnosis not present

## 2018-03-23 MED ORDER — PAROXETINE HCL 10 MG PO TABS: 10.0000 mg | ORAL_TABLET | Freq: Every day | ORAL | 2 refills | Status: DC

## 2018-03-23 MED ORDER — LISINOPRIL 20 MG PO TABS: 20.0000 mg | ORAL_TABLET | Freq: Every day | ORAL | 1 refills | Status: DC

## 2018-03-23 MED ORDER — ROSUVASTATIN CALCIUM 10 MG PO TABS: 10.0000 mg | ORAL_TABLET | Freq: Every day | ORAL | 1 refills | Status: DC

## 2018-03-23 NOTE — Patient Instructions (Addendum)
[name],   Thank you for coming in to clinic today.  1. Continue rosuvastatin without change.  2. INCREASE lisinopril to 20 mg once daily.  3.  START paroxetine 10 mg tablet.  Take 1/2 tablet for 6-8 days then 1 full tablet daily. - I encourage you to reach out to your workplace counseling program for non-medicine options to help cope with stress. - Increase physical activity to 30 minutes most days of the week.  Please schedule a follow-up appointment with Wilhelmina Mcardle, AGNP. Return in about 2 weeks (around 04/06/2018) for anxiety.  If you have any other questions or concerns, please feel free to call the clinic or send a message through MyChart. You may also schedule an earlier appointment if necessary.  You will receive a survey after today's visit either digitally by e-mail or paper by [employer]. Your experiences and feedback matter to [place].  Please respond so we know how we are doing as we provide care for you.   Wilhelmina Mcardle, DNP, AGNP-BC Adult Gerontology Nurse Practitioner [facility], [facility]

## 2018-03-23 NOTE — Progress Notes (Signed)
Subjective:    Patient ID: [name], female    DOB: 06/02/1965, 53 y.o.   MRN: [id number]  Amanda Kindredracy R Augenstein is a 53 y.o. female presenting on 03/23/2018 for Hypertension (elevated due to stress at work )   HPI Hypertension - She is not checking BP at home or outside of clinic.    - Current medications: lisinopril 10 mg once daily, tolerating well without side effects - She is symptomatic with regular headaches. - Pt denies lightheadedness, dizziness, changes in vision, chest tightness/pressure, palpitations, leg swelling, sudden loss of speech or loss of consciousness. - She  reports no regular exercise routine. - Her diet is moderate in salt, moderate in fat, and moderate in carbohydrates.   Anxiety Workplace issues.  Has trouble accessing coping skills to deal with stressors.  Has increased anxiety.     Social History   Tobacco Use  . Smoking status: Current Every Day Smoker    Packs/day: 0.50    Years: 25.00    Pack years: 12.50    Types: Cigarettes    Start date: 03/23/1993  . Smokeless tobacco: Never Used  Substance Use Topics  . Alcohol use: No    Alcohol/week: 0.0 standard drinks  . Drug use: No    Review of Systems Per HPI unless specifically indicated above     Objective:    BP (!) 155/72   Pulse 73   Resp 16   Ht 5' (1.524 m)   Wt 176 lb 12.8 oz (80.2 kg)   SpO2 99%   BMI 34.53 kg/m   Wt Readings from Last 3 Encounters:  03/23/18 176 lb 12.8 oz (80.2 kg)  02/10/18 173 lb 6.4 oz (78.7 kg)  01/27/18 174 lb (78.9 kg)    Physical Exam Vitals signs reviewed.  Constitutional:      General: She is awake. She is not in acute distress.    Appearance: She is well-developed.  HENT:     Head: Normocephalic and atraumatic.  Neck:     Musculoskeletal: Normal range of motion and neck supple.     Vascular: No carotid bruit.  Cardiovascular:     Rate and Rhythm: Normal rate and regular rhythm.     Pulses:          Radial pulses are 2+ on the right side  and 2+ on the left side.       Posterior tibial pulses are 1+ on the right side and 1+ on the left side.     Heart sounds: Normal heart sounds, S1 normal and S2 normal.  Pulmonary:     Effort: Pulmonary effort is normal. No respiratory distress.     Breath sounds: Normal breath sounds and air entry.  Skin:    General: Skin is warm and dry.  Neurological:     Mental Status: She is alert and oriented to person, place, and time.  Psychiatric:        Attention and Perception: Attention normal.        Mood and Affect: Affect normal. Mood is anxious.        Speech: Speech normal.        Behavior: Behavior normal. Behavior is cooperative.        Thought Content: Thought content normal. Thought content does not include homicidal or suicidal ideation. Thought content does not include homicidal or suicidal plan.        Cognition and Memory: Cognition and memory normal.  Judgment: Judgment normal.     Results for orders placed or performed in visit on 03/18/18  Basic metabolic panel  Result Value Ref Range   Glucose 86 65 - 99 mg/dL   BUN 11 6 - 24 mg/dL   Creatinine, Ser 0.73 0.57 - 1.00 mg/dL   GFR calc non Af Amer 101 >59 mL/min/1.73   GFR calc Af Amer 116 >59 mL/min/1.73   BUN/Creatinine Ratio 16 9 - 23   Sodium 144 134 - 144 mmol/L   Potassium 4.8 3.5 - 5.2 mmol/L   Chloride 100 96 - 106 mmol/L   CO2 24 20 - 29 mmol/L   Calcium 9.6 8.7 - 10.2 mg/dL      Assessment & Plan:   Problem List Items Addressed This Visit      Cardiovascular and Mediastinum   Essential hypertension Uncontrolled hypertension.  BP goal < 130/80.  Pt is not working on lifestyle modifications.  Taking medications tolerating well without side effects. Complications: uncontrolled anxiety.  Plan: 1. INCREASE lisinopril to 20 mg once daily 2. Obtain labs next visit for monitoring changes in dosing  3. Encouraged heart healthy diet and increasing exercise to 30 minutes most days of the week. 4. Check  BP 1-2 x per week at home, keep log, and bring to clinic at next appointment. 5. Follow up 2 months.     Relevant Medications   rosuvastatin (CRESTOR) 10 MG tablet   lisinopril (PRINIVIL,ZESTRIL) 20 MG tablet     Other   Anxiety Currently uncontrolled and no longer able to use coping mechanisms to keep anxiety controlled.  Increased situational work stress is current trigger for anxiety.  Plan: 1. START paroxetine 10 mg once daily 2. Recommended non-pharm management 3. Consider workplace provided counseling for assistance 4. Follow-up 2 months.   Relevant Medications   PARoxetine (PAXIL) 10 MG tablet   Mixed hyperlipidemia - Primary Previously stable.  No ASCVD events.  Needs lab recheck.  Refills needed and are provided today.  Follow-up 6 months.   Relevant Medications   rosuvastatin (CRESTOR) 10 MG tablet   lisinopril (PRINIVIL,ZESTRIL) 20 MG tablet   Other Relevant Orders   Lipid panel      Meds ordered this encounter  Medications  . PARoxetine (PAXIL) 10 MG tablet    Sig: Take 1 tablet (10 mg total) by mouth daily.    Dispense:  30 tablet    Refill:  2    Order Specific Question:   Supervising Provider    Answer:   Smitty Cords [[id number]]  . rosuvastatin (CRESTOR) 10 MG tablet    Sig: Take 1 tablet (10 mg total) by mouth daily.    Dispense:  90 tablet    Refill:  1    Order Specific Question:   Supervising Provider    Answer:   Smitty Cords [[id number]]    Follow up plan: Return in about 2 weeks (around 04/06/2018) for anxiety.  Wilhelmina Mcardle, DNP, AGPCNP-BC Adult Gerontology Primary Care Nurse Practitioner [facility] 03/23/2018, 1:36 PM

## 2018-03-27 ENCOUNTER — Encounter: Payer: Self-pay | Admitting: Nurse Practitioner

## 2018-04-06 ENCOUNTER — Ambulatory Visit (INDEPENDENT_AMBULATORY_CARE_PROVIDER_SITE_OTHER): Payer: Managed Care, Other (non HMO) | Admitting: Nurse Practitioner

## 2018-04-06 ENCOUNTER — Encounter: Payer: Self-pay | Admitting: Nurse Practitioner

## 2018-04-06 ENCOUNTER — Other Ambulatory Visit: Payer: Self-pay

## 2018-04-06 VITALS — BP 135/60 | HR 71 | Temp 98.1°F | Ht 60.0 in | Wt 178.0 lb

## 2018-04-06 DIAGNOSIS — F419 Anxiety disorder, unspecified: Secondary | ICD-10-CM

## 2018-04-06 DIAGNOSIS — I10 Essential (primary) hypertension: Secondary | ICD-10-CM

## 2018-04-06 NOTE — Patient Instructions (Addendum)
[name],   Thank you for coming in to clinic today.  1. Continue all medications without changes.  2. Better blood pressure reduction with healthy eating patterns.  - Increase your physical activity until you are increasing your heart rate for 30 minutes on most days of the week.    3. Continue stress management.  Please schedule a follow-up appointment with Wilhelmina Mcardle, AGNP.  Return in about 3 months (around 07/05/2018) for hypertension, cholesterol, anxiety.  If you have any other questions or concerns, please feel free to call the clinic or send a message through MyChart. You may also schedule an earlier appointment if necessary.  You will receive a survey after today's visit either digitally by e-mail or paper by [employer]. Your experiences and feedback matter to [place].  Please respond so we know how we are doing as we provide care for you.   Wilhelmina Mcardle, DNP, AGNP-BC Adult Gerontology Nurse Practitioner [facility], [facility]

## 2018-04-06 NOTE — Progress Notes (Signed)
Subjective:    Patient ID: [name], female    DOB: 1965/12/24, 53 y.o.   MRN: [id number]  Amanda Kindredracy R Loftin is a 53 y.o. female presenting on 04/06/2018 for Hypertension   HPI Hypertension - She is not checking BP at home or outside of clinic.    - Current medications: lisinopril 20 mg once daily (increased from 10 mg), tolerating well without side effects - She is not currently symptomatic. Patient has had some mild headaches.   - Pt denies headache, lightheadedness, dizziness, changes in vision, chest tightness/pressure, palpitations, leg swelling, sudden loss of speech or loss of consciousness. - She  reports no regular exercise routine. - Her diet is high in salt, moderate in fat, and moderate in carbohydrates.   Anxiety Paroxetine has helped reduce some anxiety.  Continues to have home stress.  Increased work stress for budget over next 2 weeks.  Social History   Tobacco Use  . Smoking status: Current Every Day Smoker    Packs/day: 0.50    Years: 25.00    Pack years: 12.50    Types: Cigarettes    Start date: 03/23/1993  . Smokeless tobacco: Never Used  Substance Use Topics  . Alcohol use: No    Alcohol/week: 0.0 standard drinks  . Drug use: No    Review of Systems Per HPI unless specifically indicated above     Objective:    BP 135/60 (BP Location: Left Arm, Patient Position: Sitting, Cuff Size: Large)   Pulse 71   Temp 98.1 F (36.7 C) (Oral)   Ht 5' (1.524 m)   Wt 178 lb (80.7 kg)   BMI 34.76 kg/m   Wt Readings from Last 3 Encounters:  04/06/18 178 lb (80.7 kg)  03/23/18 176 lb 12.8 oz (80.2 kg)  02/10/18 173 lb 6.4 oz (78.7 kg)    Physical Exam Vitals signs reviewed.  Constitutional:      General: She is not in acute distress.    Appearance: She is well-developed.  HENT:     Head: Normocephalic and atraumatic.  Cardiovascular:     Rate and Rhythm: Normal rate and regular rhythm.     Pulses:          Radial pulses are 2+ on the right side and  2+ on the left side.       Posterior tibial pulses are 1+ on the right side and 1+ on the left side.     Heart sounds: Normal heart sounds, S1 normal and S2 normal.  Pulmonary:     Effort: Pulmonary effort is normal. No respiratory distress.     Breath sounds: Normal breath sounds and air entry.  Musculoskeletal:     Right lower leg: No edema.     Left lower leg: No edema.  Skin:    General: Skin is warm and dry.     Capillary Refill: Capillary refill takes less than 2 seconds.  Neurological:     Mental Status: She is alert and oriented to person, place, and time.  Psychiatric:        Attention and Perception: Attention normal.        Mood and Affect: Mood and affect normal.        Behavior: Behavior normal. Behavior is cooperative.    Results for orders placed or performed in visit on 03/18/18  Basic metabolic panel  Result Value Ref Range   Glucose 86 65 - 99 mg/dL   BUN 11 6 - 24 mg/dL  Creatinine, Ser 0.70 0.57 - 1.00 mg/dL   GFR calc non Af Amer 101 >59 mL/min/1.73   GFR calc Af Amer 116 >59 mL/min/1.73   BUN/Creatinine Ratio 16 9 - 23   Sodium 144 134 - 144 mmol/L   Potassium 4.8 3.5 - 5.2 mmol/L   Chloride 100 96 - 106 mmol/L   CO2 24 20 - 29 mmol/L   Calcium 9.6 8.7 - 10.2 mg/dL      Assessment & Plan:   Problem List Items Addressed This Visit      Cardiovascular and Mediastinum   Essential hypertension - Primary Improved hypertension with increased lisinopril.  Continue regular monitoring of BP outside of clinic to monitor for goals.  Add regular exercise and low salt diet for additional lowering to goals.  Follow-up 3 months.     Other   Anxiety Patient with some initial improvement of anxiety on paroxetine.  Patient tolerates well without side effects.  Continue non-medication options for stress management including exercise.  Follow-up 3 months.      Follow up plan: Return in about 3 months (around 07/05/2018) for hypertension, cholesterol,  anxiety.  Wilhelmina Mcardle, DNP, AGPCNP-BC Adult Gerontology Primary Care Nurse Practitioner [facility] 04/06/2018, 4:07 PM

## 2018-07-06 ENCOUNTER — Ambulatory Visit: Payer: Managed Care, Other (non HMO) | Admitting: Nurse Practitioner

## 2018-07-07 ENCOUNTER — Encounter: Payer: Self-pay | Admitting: Family Medicine

## 2018-07-07 ENCOUNTER — Other Ambulatory Visit: Payer: Self-pay

## 2018-07-07 ENCOUNTER — Ambulatory Visit (INDEPENDENT_AMBULATORY_CARE_PROVIDER_SITE_OTHER): Payer: Managed Care, Other (non HMO) | Admitting: Family Medicine

## 2018-07-07 VITALS — Temp 97.6°F

## 2018-07-07 DIAGNOSIS — Z720 Tobacco use: Secondary | ICD-10-CM | POA: Diagnosis not present

## 2018-07-07 DIAGNOSIS — I10 Essential (primary) hypertension: Secondary | ICD-10-CM

## 2018-07-07 DIAGNOSIS — R232 Flushing: Secondary | ICD-10-CM

## 2018-07-07 DIAGNOSIS — F17210 Nicotine dependence, cigarettes, uncomplicated: Secondary | ICD-10-CM

## 2018-07-07 MED ORDER — VARENICLINE TARTRATE 0.5 MG X 11 & 1 MG X 42 PO MISC: ORAL | 0 refills | Status: DC

## 2018-07-07 NOTE — Patient Instructions (Addendum)
I am not worried about the facial flushing as you describe it - we can monitor this for now, usually it is a symptom of something and often it is benign, keep track on this if it does not improve after days to weeks or worsens let me know  IF you CANNOT tolerate Chantix or want to change let me know - we can switch to Wellbutrin. ALSO if you are doing well on Chantix and need refills to continue for UP TO THREE MONTHS TOTAL (contact us)   Varenicline - Chantix Dosing: Duration Dosage  Initial 3 days  0.5mg  daily  Days 4-7 0.5mg  twice daily  Day 8 through the end of therapy  1mg  twice daily   Prescribing Instructions: Marland Kitchen Use of "Chantix - Starting Month Pak" and "Chantix - Continuing Month Pak" provide easy to use blister packaging.  . Black Box Warning: Mood Change -"Serious neuropsychiatric events have been reported." Stop Drug and contact health care provider if patient. "agitation, hostility, depressed mood or changes in thinking or behavior that are not typical for the patient are observed, or if the patient develops suicidal ideation / behavior." Document potential mood change.  . Patients can continue to smoke while initiating varenicline. . A "target quit date" should be set on calendar approximately Day 8 to max Day 30 (of the treatment) - PREFERRED QUIT DATE - 1 to 2 weeks from start of medicine. . Advise to take this medication WITH food.  Minimizing the nausea (typically mild and transient).  If nausea occurs, consider dose reduction or temporary cessation of treatment.   . The treatment should be continued for 12 weeks.  An additional 12 weeks of therapy can be used at the prescribers discretion.   . Chantix can change the way people react to alcohol (decreased tolerance, increased drunkenness, unusual or aggressive behavior, memory loss) . Seizures occurred in patients that had no history of seizures and in patients that had a seizure disorder that had been well controlled  . Discuss  Cardiovascular Safety    Please schedule a Follow-up Appointment to: Return in about 3 months (around 10/07/2018) for smoking.  If you have any other questions or concerns, please feel free to call the office or send a message through MyChart. You may also schedule an earlier appointment if necessary.  Additionally, you may be receiving a survey about your experience at our office within a few days to 1 week by e-mail or mail. We value your feedback.  Saralyn Pilar, DO [facility], [place]

## 2018-07-07 NOTE — Progress Notes (Signed)
Virtual Visit via Telephone The purpose of this virtual visit is to provide medical care while limiting exposure to the novel coronavirus (COVID19) for both patient and office staff.  Consent was obtained for phone visit:  Yes.   Answered questions that patient had about telehealth interaction:  Yes.   I discussed the limitations, risks, security and privacy concerns of performing an evaluation and management service by telephone. I also discussed with the patient that there may be a patient responsible charge related to this service. The patient expressed understanding and agreed to proceed.  Patient Location: Home Provider Location: [facility] (Office)  PCP is Wilhelmina McardleLauren Kennedy, AGPCNP-BC - I am currently covering during her maternity leave.   ---------------------------------------------------------------------- Chief Complaint  Patient presents with  . Hypertension    Facial flushing and rediness. Mostly on the cheeks x1 day. Intermittent right arm pain   . Anxiety  . Nicotine Dependence    pt needs to quit smoking prior to surgery     S: Reviewed CMA documentation. I have called patient and gathered additional HPI as follows:   CHRONIC HTN / Facial Flushing Reports new problem past 1 day with some red facial flushing warmth on cheeks, her actual temperature was normal. She did not have this problem before, but it developed yesterday acutely after had ankle steroid injection, she has had these before without problem. She also takes NSAID Tylenol without problem. - Has not checked BP recently, not endorsing any other problem with BP Current Meds - Lisinopril 20mg  daily   Reports good compliance, took meds today. Tolerating well, w/o complaints. Denies CP, dyspnea, HA, edema, dizziness / lightheadedness  Tobacco Abuse / Nicotine Dependence Active smoker 1.5ppd, >25+ years variable smoking in past 0.5 to 1 to 1.5 ppd, prior quit attempts, not tried medication. Now  anticipating goal to quit smoking before future orthopedic surgery  Denies any high risk travel to areas of current concern for COVID19. Denies any known or suspected exposure to person with or possibly with COVID19.  Denies any fevers, chills, sweats, body ache, cough, shortness of breath, sinus pain or pressure, headache, abdominal pain, diarrhea  Past Medical History:  Diagnosis Date  . Anxiety   . Fatigue   . Hyperlipemia   . Irregular menstrual cycle   . Obesity    Social History   Tobacco Use  . Smoking status: Current Every Day Smoker    Packs/day: 1.50    Years: 25.00    Pack years: 37.50    Types: Cigarettes    Start date: 03/23/1993  . Smokeless tobacco: Never Used  Substance Use Topics  . Alcohol use: No    Alcohol/week: 0.0 standard drinks  . Drug use: No    Current Outpatient Medications:  .  lisinopril (PRINIVIL,ZESTRIL) 20 MG tablet, Take 1 tablet (20 mg total) by mouth daily., Disp: 90 tablet, Rfl: 1 .  PARoxetine (PAXIL) 10 MG tablet, Take 1 tablet (10 mg total) by mouth daily., Disp: 30 tablet, Rfl: 2 .  rosuvastatin (CRESTOR) 10 MG tablet, Take 1 tablet (10 mg total) by mouth daily., Disp: 90 tablet, Rfl: 1 .  varenicline (CHANTIX PAK) 0.5 MG X 11 & 1 MG X 42 tablet, Take one 0.5 mg tab by mouth once daily for 3 days, increase to one 0.5 mg twice daily for 4 days, then increase to one 1 mg twice daily., Disp: 53 tablet, Rfl: 0  [facility] 07/07/2018 04/06/2018 03/23/2018  Decreased Interest 0 0  0  Down, Depressed, Hopeless 0 0 0  PHQ - 2 Score 0 0 0  Altered sleeping 0 - 2  Tired, decreased energy 1 - 3  Change in appetite 0 - 0  Feeling bad or failure about yourself  0 - 0  Trouble concentrating 0 - 0  Moving slowly or fidgety/restless 0 - 0  Suicidal thoughts 0 - 0  PHQ-9 Score 1 - 5  Difficult doing work/chores Not difficult at all - -    GAD 7 : Generalized Anxiety Score 07/07/2018  Nervous, Anxious, on Edge 0  Control/stop  worrying 0  Worry too much - different things 0  Trouble relaxing 0  Restless 0  Easily annoyed or irritable 0  Afraid - awful might happen 0  Total GAD 7 Score 0  Anxiety Difficulty Not difficult at all    -------------------------------------------------------------------------- O: No physical exam performed due to remote telephone encounter.  Lab results reviewed.  No results found for this or any previous visit (from the past 2160 hour(s)).  -------------------------------------------------------------------------- A&P:  Problem List Items Addressed This Visit    Essential hypertension Facial flushing      Clinically with reportedly stable HTN without any further changes recently No concerns of elevated BP Seems stable on Lisinopril 20mg  daily  Suspect facial flushing is related to variety of potential causes such as stress/anxiety/emotional vs possible result of medication steroid injection recently. - Reassurance given, no new concerns at this time - monitor flushing for now, if persistent can discuss again    Other Visit Diagnoses    Tobacco abuse    -  Primary   Relevant Medications   varenicline (CHANTIX PAK) 0.5 MG X 11 & 1 MG X 42 tablet      Cigarette nicotine dependence without complication       Relevant Medications   varenicline (CHANTIX PAK) 0.5 MG X 11 & 1 MG X 42 tablet     Plan: 1. Start Varenicline (Chantix) Day 1-3: 0.5mg  once daily WITH FOOD, Days 4-7: increase to 0.5mg  BID, then maintenance dose after 1 week: 1mg  BID for up to 12 weeks - Set quit date 1 week after start of medication, alternatively if unable to quit, then set goal reduce by 50% or more by 4 weeks, then another 50% reduction in 4 more weeks, and lastly quit after final 4 weeks (total 12 week therapy) - Optional additional 12 weeks if needed for maintenance - Counseling on side effects primarily nausea (take with food), headaches, insomnia, vivid dreams, mood instability, seizure,  rare risk of suicidal ideation, if any serious agitation or acute depression severe mood changes need to stop med  Discussion today >5 minutes (<10 minutes) specifically on counseling on risks of tobacco use, complications, treatment, smoking cessation.    Meds ordered this encounter  Medications  . varenicline (CHANTIX PAK) 0.5 MG X 11 & 1 MG X 42 tablet    Sig: Take one 0.5 mg tab by mouth once daily for 3 days, increase to one 0.5 mg twice daily for 4 days, then increase to one 1 mg twice daily.    Dispense:  53 tablet    Refill:  0    Follow-up: - Return in 6 week to 3 months for smoking cessation / HTN  Patient verbalizes understanding with the above medical recommendations including the limitation of remote medical advice.  Specific follow-up and call-back criteria were given for patient to follow-up or seek medical care more urgently if needed.   -  Time spent in direct consultation with patient on phone: 17 minutes   Saralyn Pilar, DO [facility] 07/07/2018, 1:22 PM

## 2018-09-01 ENCOUNTER — Other Ambulatory Visit: Payer: Self-pay | Admitting: Nurse Practitioner

## 2018-09-01 DIAGNOSIS — Z720 Tobacco use: Secondary | ICD-10-CM

## 2018-09-01 MED ORDER — VARENICLINE TARTRATE 1 MG PO TABS: 1.0000 mg | ORAL_TABLET | Freq: Two times a day (BID) | ORAL | 1 refills | Status: DC

## 2018-09-01 NOTE — Telephone Encounter (Signed)
Pt called requesting refill on Chantix  Called into   [facility]

## 2019-05-26 ENCOUNTER — Other Ambulatory Visit: Payer: Self-pay

## 2019-05-26 ENCOUNTER — Encounter: Payer: Self-pay | Admitting: Family Medicine

## 2019-05-26 ENCOUNTER — Ambulatory Visit (INDEPENDENT_AMBULATORY_CARE_PROVIDER_SITE_OTHER): Payer: Managed Care, Other (non HMO) | Admitting: Family Medicine

## 2019-05-26 VITALS — BP 163/68 | HR 66 | Temp 97.3°F | Ht 62.0 in | Wt 194.6 lb

## 2019-05-26 DIAGNOSIS — Z1231 Encounter for screening mammogram for malignant neoplasm of breast: Secondary | ICD-10-CM

## 2019-05-26 DIAGNOSIS — Z1159 Encounter for screening for other viral diseases: Secondary | ICD-10-CM

## 2019-05-26 DIAGNOSIS — I10 Essential (primary) hypertension: Secondary | ICD-10-CM

## 2019-05-26 DIAGNOSIS — E782 Mixed hyperlipidemia: Secondary | ICD-10-CM

## 2019-05-26 DIAGNOSIS — Z6835 Body mass index (BMI) 35.0-35.9, adult: Secondary | ICD-10-CM | POA: Diagnosis not present

## 2019-05-26 DIAGNOSIS — F419 Anxiety disorder, unspecified: Secondary | ICD-10-CM

## 2019-05-26 DIAGNOSIS — Z1211 Encounter for screening for malignant neoplasm of colon: Secondary | ICD-10-CM

## 2019-05-26 DIAGNOSIS — R3129 Other microscopic hematuria: Secondary | ICD-10-CM

## 2019-05-26 DIAGNOSIS — Z0000 Encounter for general adult medical examination without abnormal findings: Secondary | ICD-10-CM

## 2019-05-26 DIAGNOSIS — Z114 Encounter for screening for human immunodeficiency virus [HIV]: Secondary | ICD-10-CM

## 2019-05-26 LAB — POCT URINALYSIS DIPSTICK
Bilirubin, UA: NEGATIVE
Glucose, UA: NEGATIVE
Ketones, UA: NEGATIVE
Leukocytes, UA: NEGATIVE
Nitrite, UA: NEGATIVE
Protein, UA: NEGATIVE
Spec Grav, UA: 1.015 (ref 1.010–1.025)
Urobilinogen, UA: 0.2 E.U./dL
pH, UA: 5 (ref 5.0–8.0)

## 2019-05-26 MED ORDER — HYDROXYZINE HCL 25 MG PO TABS: 25.0000 mg | ORAL_TABLET | Freq: Three times a day (TID) | ORAL | 0 refills | Status: DC | PRN

## 2019-05-26 MED ORDER — FLUOXETINE HCL 10 MG PO CAPS: ORAL_CAPSULE | ORAL | 0 refills | Status: DC

## 2019-05-26 MED ORDER — LISINOPRIL 20 MG PO TABS: 20.0000 mg | ORAL_TABLET | Freq: Every day | ORAL | 1 refills | Status: DC

## 2019-05-26 NOTE — Patient Instructions (Signed)
As we discussed, have your labs drawn in the next 1-2 weeks and we will contact you with the results.  The following recommendations are helpful adjuncts for helping rebalance your mood.  Eat a nourishing diet. Ensure adequate intake of calories, protein, carbs, fat, vitamins, and minerals. Prioritize whole foods at each meal, including meats, vegetables, fruits, nuts and seeds, etc.   Avoid inflammatory and/or "junk" foods, such as sugar, omega-6 fats, refined grains, chemicals, and preservatives are common in packaged and prepared foods. Minimize or completely avoid these ingredients and stick to whole foods with little to no additives. Cook from scratch as much as possible for more control over what you eat  Get enough sleep. Poor sleep is significantly associated with depression and anxiety. Make 7-9 hours of sleep nightly a top priority  Exercise appropriately. Exercise is known to improve brain functioning and boost mood. Aim for 30 minutes of daily physical activity. Avoid "overtraining," which can cause mental disturbances  Assess your light exposure. Not enough natural light during the day and too much artificial light can have a major impact on your mood. Get outside as often as possible during daylight hours. Minimize light exposure after dark and avoid the use of electronics that give off blue light before bed  Manage your stress.  Use daily stress management techniques such as meditation, yoga, or mindfulness to retrain your brain to respond differently to stress. Try deep breathing to deactivate your "fight or flight" response.  There are many of sources with apps like Headspace, Calm or a variety of YouTube videos (videos from Gwynne Edinger have guided meditation)  Prioritize your social life. Work on building social support with new friends or improve current relationships. Consider getting a pet that allows for companionship, social interaction, and physical touch. Try  volunteering or joining a faith-based community to increase your sense of purpose  4-7-8 breathing technique at bedtime: breathe in to count of 4, hold breath for count of 7, exhale for count of 8; do 3-5 times for letting go of overactive thoughts  Take time to play Unstructured "play" time can help reduce anxiety and depression Options for play include music, games, sports, dance, art, etc.  Try to add daily omega 3 fatty acids, magnesium, B complex, and balanced amino acid supplements to help improve mood and anxiety.  Sleep hygiene is the single most effective treatment for sleep issues, but it is hard work.  Tips for a good night's sleep:  -Keep sleep environment comfortable and conducive to sleep -Keep regular sleep schedule 7 nights a week -Avoiding naps during the day -Avoiding going to bed until drowsy and ready to sleep, not trying to sleep, and not watching the clock -Get out of bed if not asleep within 15-20 minutes and returning only when drowsy -Avoiding caffeine, nicotine, alcohol, and other substances that interfere with sleep before bedtime -Take an hour before your set bedtime and start to wind down: bath/shower, no more TV or phone (the blue light can interfere with sleeping), listen to soothing music, or meditation -No TV in your bedroom -Exercising regularly, at least 6 hours before sleep. Yoga and Tai Chi can improve sleep quality  There are a lot of books and apps that may help guide you with any of the following:   -Progressive muscle relaxation (involves methodical tension and relaxation of different Muscle groups throughout body)  Guided imagery  -YouTube - Gwynne Edinger has free videos on YouTube that can help with meditation and some  Abdominal breathing   Over the counter sleep aid one hour before bed- and gradually wean your use over 2-4 weeks  Some examples are : *Melatonin 5-10 mg *Sleepology (Can find on [employer]) taken according to packaging  directions  There are a few online evidence based online programs, unfortunately they are not free.   Developed by a sleep expert who created a drug-free program for insomnia proven more effective than sleeping pills.  www.cbtforinsomnia.com Sleepio is an evidence-based digital sleep improvement program   www.sleepio.com SHUTi is designed to actively help retrain your body and mind for great sleep through six engaging Cognitive Behavioral Therapy for Insomnia strategy and learning sessions  [email or web address].[email or web address]  Try to get exercise a minimum of 30 minutes per day at least 5 days per week as well as  adequate water intake all while measuring blood pressure a few times per week.  Keep a blood pressure log and bring back to clinic at your next visit.  If your readings are consistently over 140/90 to contact our office/send me a MyChart message and we will see you sooner.  Can try DASH and Mediterranean diet options, avoiding processed foods, lowering sodium intake, avoiding pork products, and eating a plant based diet for optimal health.      Mediterranean Diet  Why follow it? Research shows. . Those who follow the Mediterranean diet have a reduced risk of heart disease  . The diet is associated with a reduced incidence of Parkinson's and Alzheimer's diseases . People following the diet may have longer life expectancies and lower rates of chronic diseases  . The Dietary Guidelines for Americans recommends the Mediterranean diet as an eating plan to promote health and prevent disease  What Is the Mediterranean Diet?  . Healthy eating plan based on typical foods and recipes of Mediterranean-style cooking . The diet is primarily a plant based diet; these foods should make up a majority of meals   Starches - Plant based foods should make up a majority of meals - They are an important sources of vitamins, minerals, energy, antioxidants, and fiber - Choose whole grains, foods high in fiber and  minimally processed items  - Typical grain sources include wheat, oats, barley, corn, brown rice, bulgar, farro, millet, polenta, couscous  - Various types of beans include chickpeas, lentils, fava beans, black beans, white beans   Fruits  Veggies - Large quantities of antioxidant rich fruits & veggies; 6 or more servings  - Vegetables can be eaten raw or lightly drizzled with oil and cooked  - Vegetables common to the traditional Mediterranean Diet include: artichokes, arugula, beets, broccoli, brussel sprouts, cabbage, carrots, celery, collard greens, cucumbers, eggplant, kale, leeks, lemons, lettuce, mushrooms, okra, onions, peas, peppers, potatoes, pumpkin, radishes, rutabaga, shallots, spinach, sweet potatoes, turnips, zucchini - Fruits common to the Mediterranean Diet include: apples, apricots, avocados, cherries, clementines, dates, figs, grapefruits, grapes, melons, nectarines, oranges, peaches, pears, pomegranates, strawberries, tangerines  Fats - Replace butter and margarine with healthy oils, such as olive oil, canola oil, and tahini  - Limit nuts to no more than a handful a day  - Nuts include walnuts, almonds, pecans, pistachios, pine nuts  - Limit or avoid candied, honey roasted or heavily salted nuts - Olives are central to the [employer] - can be eaten whole or used in a variety of dishes   Meats Protein - Limiting red meat: no more than a few times a month - When eating red meat: choose lean cuts and keep  the portion to the size of deck of cards - Eggs: approx. 0 to 4 times a week  - Fish and lean poultry: at least 2 a week  - Healthy protein sources include, chicken, [place], lean beef, lamb - Increase intake of seafood such as tuna, salmon, trout, mackerel, shrimp, scallops - Avoid or limit high fat processed meats such as sausage and bacon  Dairy - Include moderate amounts of low fat dairy products  - Focus on healthy dairy such as fat free yogurt, skim milk, low or  reduced fat cheese - Limit dairy products higher in fat such as whole or 2% milk, cheese, ice cream  Alcohol - Moderate amounts of red wine is ok  - No more than 5 oz daily for women (all ages) and men older than age 74  - No more than 10 oz of wine daily for men younger than 38  Other - Limit sweets and other desserts  - Use herbs and spices instead of salt to flavor foods  - Herbs and spices common to the traditional Mediterranean Diet include: basil, bay leaves, chives, cloves, cumin, fennel, garlic, lavender, marjoram, mint, oregano, parsley, pepper, rosemary, sage, savory, sumac, tarragon, thyme   It's not just a diet, it's a lifestyle:  . The Mediterranean diet includes lifestyle factors typical of those in the region  . Foods, drinks and meals are best eaten with others and savored . Daily physical activity is important for overall good health . This could be strenuous exercise like running and aerobics . This could also be more leisurely activities such as walking, housework, yard-work, or taking the stairs . Moderation is the key; a balanced and healthy diet accommodates most foods and drinks . Consider portion sizes and frequency of consumption of certain foods   Meal Ideas & Options:  . Breakfast:  o Whole wheat toast or whole wheat English muffins with peanut butter & hard boiled egg o Steel cut oats topped with apples & cinnamon and skim milk  o Fresh fruit: banana, strawberries, melon, berries, peaches  o Smoothies: strawberries, bananas, greek yogurt, peanut butter o Low fat greek yogurt with blueberries and granola  o Egg white omelet with spinach and mushrooms o Breakfast couscous: whole wheat couscous, apricots, skim milk, cranberries  . Sandwiches:  o Hummus and grilled vegetables (peppers, zucchini, squash) on whole wheat bread   o Grilled chicken on whole wheat pita with lettuce, tomatoes, cucumbers or tzatziki  o Tuna salad on whole wheat bread: tuna salad made  with greek yogurt, olives, red peppers, capers, green onions o Garlic rosemary lamb pita: lamb sauted with garlic, rosemary, salt & pepper; add lettuce, cucumber, greek yogurt to pita - flavor with lemon juice and black pepper  . Seafood:  o Mediterranean grilled salmon, seasoned with garlic, basil, parsley, lemon juice and black pepper o Shrimp, lemon, and spinach whole-grain pasta salad made with low fat greek yogurt  o Seared scallops with lemon orzo  o Seared tuna steaks seasoned salt, pepper, coriander topped with tomato mixture of olives, tomatoes, olive oil, minced garlic, parsley, green onions and cappers  . Meats:  o Herbed greek chicken salad with kalamata olives, cucumber, feta  o Red bell peppers stuffed with spinach, bulgur, lean ground beef (or lentils) & topped with feta   o Kebabs: skewers of chicken, tomatoes, onions, zucchini, squash  o [place] burgers: made with red onions, mint, dill, lemon juice, feta cheese topped with roasted red peppers . Vegetarian o  Cucumber salad: cucumbers, artichoke hearts, celery, red onion, feta cheese, tossed in olive oil & lemon juice  o Hummus and whole grain pita points with a greek salad (lettuce, tomato, feta, olives, cucumbers, red onion) o Lentil soup with celery, carrots made with vegetable broth, garlic, salt and pepper  o Tabouli salad: parsley, bulgur, mint, scallions, cucumbers, tomato, radishes, lemon juice, olive oil, salt and pepper.  We will plan to see you back in 2 weeks for hypertension follow up, PAP testing and weight management  You will receive a survey after today's visit either digitally by e-mail or paper by [employer] mail. Your experiences and feedback matter to [place].  Please respond so we know how we are doing as we provide care for you.  Call us with any questions/concerns/needs.  It is my goal to be available to you for your health concerns.  Thanks for choosing me to be a partner in your healthcare needs!  Harlin Rain, FNP-C Family Nurse Practitioner [facility] Phone: [phone]

## 2019-05-26 NOTE — Assessment & Plan Note (Signed)
GAD now with gradual worsening causing more difficulty functioning, previously coped well, now affecting daily activities and driving in the car.  Physically with fatigue from poor sleep / worrying, work/financial stress is primary stressor. Suspected insomnia is secondary to anxiety/mood. - Failed: Paroxetine due to nausea  Plan: 1. Discussion on diagnosis anxiety, management, complications, likely contributing to insomnia 2. Start fluoxetine 10mg  daily PM with food for the next 7 days before titrating up to 20mg  daily, counseling on potential side effects risks, reviewed possible GI intolerance, insomnia (although likely to improve this given anxiety likely source of insomnia), anticipate 4-6 weeks for notable effect, may need titrate dose in future 3. Advised recommend therapy / counseling in future 4. Follow-up 4-6 weeks anxiety, med adjust, GAD7/PHQ9

## 2019-05-26 NOTE — Assessment & Plan Note (Signed)
Status unknown.  Recheck labs.  Not taking rosuvastatin as previously ordered.  Will obtain labs and update treatment plan.

## 2019-05-26 NOTE — Assessment & Plan Note (Signed)
Uncontrolled hypertension.  BP goal < 130/80.  Pt is not working on lifestyle modifications.  Reports has not been taking her medications as she does not have a good routine to remember to take them.  Complications obesity, hyperlipidemia  Plan: 1. RESTART Lisinopril 20mg  daily 2. Obtain labs ordered today in the next 1-2 weeks  3. Encouraged heart healthy diet and increasing exercise to 30 minutes most days of the week, going no more than 2 days in a row without exercise. 4. Check BP 1-2 x per day at home, keep log, and bring to clinic at next appointment. 5. Follow up 2 weeks for BP recheck.

## 2019-05-26 NOTE — Assessment & Plan Note (Signed)
Annual physical exam without new findings.  Well adult with no acute concerns.  Plan: 1. Obtain health maintenance screenings as above according to age. - Increase physical activity to 30 minutes most days of the week.  - Eat healthy diet high in vegetables and fruits; low in refined carbohydrates. - Screening labs and tests as ordered - Mammogram ordered - Cologuard ordered today - HIV/Hep C screening labs ordered today - PAP testing in 2 weeks 2. Return 1 year for annual physical.

## 2019-05-26 NOTE — Progress Notes (Signed)
Subjective:    Patient ID: [name], female    DOB: 06/24/1965, 54 y.o.   MRN: [id number]  [name] is a 54 y.o. female presenting on 05/26/2019 for Annual Exam   HPI  HEALTH MAINTENANCE:  Weight/BMI: 16lb weight gain from last office visit on 04/06/2018 Physical activity: No exercise routine Diet: Regular Seatbelt: Yes Mammogram: DUE - Ordered today PAP: DUE - Will schedule appointment in 2 weeks Colon cancer screening: DUE - Ordered Cologuard today Skin exam: Met with dermatology last year HIV & Hep C Screening: DUE - Ordered today  IMMUNIZATIONS: Influenza: Due next season Tetanus: UP TO DATE: 02/11/2018 COVID: To be determined  Depression screen [facility] 2/9 07/07/2018 04/06/2018 03/23/2018  Decreased Interest 0 0 0  Down, Depressed, Hopeless 0 0 0  PHQ - 2 Score 0 0 0  Altered sleeping 0 - 2  Tired, decreased energy 1 - 3  Change in appetite 0 - 0  Feeling bad or failure about yourself  0 - 0  Trouble concentrating 0 - 0  Moving slowly or fidgety/restless 0 - 0  Suicidal thoughts 0 - 0  PHQ-9 Score 1 - 5  Difficult doing work/chores Not difficult at all - -    Past Medical History:  Diagnosis Date  . Anxiety   . Fatigue   . Hyperlipemia   . Irregular menstrual cycle   . Obesity    Past Surgical History:  Procedure Laterality Date  . BACK SURGERY     x 4   Social History   Socioeconomic History  . Marital status: Married    Spouse name: Not on file  . Number of children: Not on file  . Years of education: Not on file  . Highest education level: High school graduate  Occupational History  . Not on file  Tobacco Use  . Smoking status: Current Every Day Smoker    Packs/day: 1.50    Years: 25.00    Pack years: 37.50    Types: Cigarettes    Start date: 03/23/1993  . Smokeless tobacco: Never Used  Substance and Sexual Activity  . Alcohol use: No    Alcohol/week: 0.0 standard drinks  . Drug use: No  . Sexual activity: Yes    Birth  control/protection: Post-menopausal  Other Topics Concern  . Not on file  Social History Narrative  . Not on file   Social Determinants of Health   Financial Resource Strain:   . Difficulty of Paying Living Expenses:   Food Insecurity:   . Worried About Charity fundraiser in the Last Year:   . Arboriculturist in the Last Year:   Transportation Needs:   . Film/video editor (Medical):   Marland Kitchen Lack of Transportation (Non-Medical):   Physical Activity:   . Days of Exercise per Week:   . Minutes of Exercise per Session:   Stress:   . Feeling of Stress :   Social Connections:   . Frequency of Communication with Friends and Family:   . Frequency of Social Gatherings with Friends and Family:   . Attends Religious Services:   . Active Member of Clubs or Organizations:   . Attends Archivist Meetings:   Marland Kitchen Marital Status:   Intimate Partner Violence:   . Fear of Current or Ex-Partner:   . Emotionally Abused:   Marland Kitchen Physically Abused:   . Sexually Abused:    Family History  Problem Relation Age of Onset  .  Heart disease Father   . Hypertension Father   . Thyroid disease Father   . Heart attack Father 30  . CAD Father   . Kidney failure Father   . Hypertension Mother   . Heart failure Maternal Grandmother   . Lung cancer Maternal Grandfather   . Cancer Paternal Grandmother   . Coronary artery disease Paternal Grandmother   . Cancer Paternal Grandfather   . Diabetes Neg Hx    No current outpatient medications on file prior to visit.   No current facility-administered medications on file prior to visit.    Per HPI unless specifically indicated above     Objective:    BP (!) 163/68 (BP Location: Left Arm, Patient Position: Sitting, Cuff Size: Normal)   Pulse 66   Temp (!) 97.3 F (36.3 C) (Temporal)   Ht '5\' 2"'$  (1.575 m)   Wt 194 lb 9.6 oz (88.3 kg)   BMI 35.59 kg/m   Wt Readings from Last 3 Encounters:  05/26/19 194 lb 9.6 oz (88.3 kg)  04/06/18 178 lb  (80.7 kg)  03/23/18 176 lb 12.8 oz (80.2 kg)    Physical Exam Vitals reviewed.  Constitutional:      General: She is not in acute distress.    Appearance: Normal appearance. She is well-developed and well-groomed. She is obese. She is not ill-appearing or toxic-appearing.  HENT:     Head: Normocephalic.     Right Ear: Tympanic membrane, ear canal and external ear normal. There is no impacted cerumen.     Left Ear: Tympanic membrane, ear canal and external ear normal. There is no impacted cerumen.     Mouth/Throat:     Lips: Pink.     Pharynx: Uvula midline.  Eyes:     General: Lids are normal. Vision grossly intact. No scleral icterus.       Right eye: No discharge.        Left eye: No discharge.     Extraocular Movements: Extraocular movements intact.     Conjunctiva/sclera: Conjunctivae normal.     Pupils: Pupils are equal, round, and reactive to light.  Neck:     Thyroid: No thyroid mass or thyromegaly.  Cardiovascular:     Rate and Rhythm: Normal rate and regular rhythm.     Pulses: Normal pulses.          Dorsalis pedis pulses are 2+ on the right side and 2+ on the left side.       Posterior tibial pulses are 2+ on the right side and 2+ on the left side.     Heart sounds: Normal heart sounds. No murmur. No friction rub. No gallop.   Pulmonary:     Effort: Pulmonary effort is normal. No respiratory distress.     Breath sounds: Normal breath sounds.  Abdominal:     General: Abdomen is flat. Bowel sounds are normal. There is no distension.     Palpations: Abdomen is soft. There is no hepatomegaly, splenomegaly or mass.     Tenderness: There is no abdominal tenderness. There is no guarding or rebound.     Hernia: No hernia is present.  Musculoskeletal:        General: Normal range of motion.     Cervical back: Normal range of motion and neck supple. No tenderness.     Right lower leg: No edema.     Left lower leg: No edema.  Feet:     Right foot:  Skin integrity: Skin  integrity normal.     Left foot:     Skin integrity: Skin integrity normal.  Lymphadenopathy:     Cervical: No cervical adenopathy.  Skin:    General: Skin is warm and dry.     Capillary Refill: Capillary refill takes less than 2 seconds.  Neurological:     General: No focal deficit present.     Mental Status: She is alert and oriented to person, place, and time.     Cranial Nerves: No cranial nerve deficit.     Sensory: No sensory deficit.     Motor: No weakness.     Coordination: Coordination normal.     Gait: Gait abnormal.     Deep Tendon Reflexes: Reflexes normal.     Comments: Antalgic gait  Psychiatric:        Attention and Perception: Attention and perception normal.        Mood and Affect: Mood and affect normal.        Speech: Speech normal.        Behavior: Behavior normal. Behavior is cooperative.        Thought Content: Thought content normal.        Cognition and Memory: Cognition and memory normal.        Judgment: Judgment normal.     Results for orders placed or performed in visit on 05/26/19  POCT Urinalysis Dipstick  Result Value Ref Range   Color, UA Yellow    Clarity, UA Clear    Glucose, UA Negative Negative   Bilirubin, UA negative    Ketones, UA negative    Spec Grav, UA 1.015 1.010 - 1.025   Blood, UA moderate    pH, UA 5.0 5.0 - 8.0   Protein, UA Negative Negative   Urobilinogen, UA 0.2 0.2 or 1.0 E.U./dL   Nitrite, UA negative    Leukocytes, UA Negative Negative   Appearance     Odor        Assessment & Plan:   Problem List Items Addressed This Visit      Cardiovascular and Mediastinum   Essential hypertension    Uncontrolled hypertension.  BP goal < 130/80.  Pt is not working on lifestyle modifications.  Reports has not been taking her medications as she does not have a good routine to remember to take them.  Complications obesity, hyperlipidemia  Plan: 1. RESTART Lisinopril '20mg'$  daily 2. Obtain labs ordered today in the next 1-2  weeks  3. Encouraged heart healthy diet and increasing exercise to 30 minutes most days of the week, going no more than 2 days in a row without exercise. 4. Check BP 1-2 x per day at home, keep log, and bring to clinic at next appointment. 5. Follow up 2 weeks for BP recheck.         Relevant Medications   lisinopril (ZESTRIL) 20 MG tablet     Other   Anxiety    GAD now with gradual worsening causing more difficulty functioning, previously coped well, now affecting daily activities and driving in the car.  Physically with fatigue from poor sleep / worrying, work/financial stress is primary stressor. Suspected insomnia is secondary to anxiety/mood. - Failed: Paroxetine due to nausea  Plan: 1. Discussion on diagnosis anxiety, management, complications, likely contributing to insomnia 2. Start fluoxetine '10mg'$  daily PM with food for the next 7 days before titrating up to '20mg'$  daily, counseling on potential side effects risks, reviewed possible GI intolerance, insomnia (  although likely to improve this given anxiety likely source of insomnia), anticipate 4-6 weeks for notable effect, may need titrate dose in future 3. Advised recommend therapy / counseling in future 4. Follow-up 4-6 weeks anxiety, med adjust, GAD7/PHQ9       Relevant Medications   FLUoxetine (PROZAC) 10 MG capsule   hydrOXYzine (ATARAX/VISTARIL) 25 MG tablet   Mixed hyperlipidemia    Status unknown.  Recheck labs.  Not taking rosuvastatin as previously ordered.  Will obtain labs and update treatment plan.      Relevant Medications   lisinopril (ZESTRIL) 20 MG tablet   Routine medical exam    Annual physical exam without new findings.  Well adult with no acute concerns.  Plan: 1. Obtain health maintenance screenings as above according to age. - Increase physical activity to 30 minutes most days of the week.  - Eat healthy diet high in vegetables and fruits; low in refined carbohydrates. - Screening labs and tests as  ordered - Mammogram ordered - Cologuard ordered today - HIV/Hep C screening labs ordered today - PAP testing in 2 weeks 2. Return 1 year for annual physical.        Other Visit Diagnoses    Annual physical exam    -  Primary   Relevant Orders   POCT Urinalysis Dipstick (Completed)   CBC with Differential   COMPLETE METABOLIC PANEL WITH GFR   Lipid Profile   Thyroid Panel With TSH   HgB A1c   Microscopic hematuria       Relevant Orders   Urinalysis, microscopic only   Encounter for screening mammogram for malignant neoplasm of breast       Relevant Orders   MM 3D SCREEN BREAST BILATERAL   BMI 35.0-35.9,adult       Relevant Orders   Thyroid Panel With TSH   HgB A1c   Screening for colon cancer       Relevant Orders   Cologuard   Screening for HIV (human immunodeficiency virus)       Relevant Orders   HIV antibody (with reflex)   Encounter for hepatitis C screening test for low risk patient       Relevant Orders   Hepatitis C Antibody      Meds ordered this encounter  Medications  . lisinopril (ZESTRIL) 20 MG tablet    Sig: Take 1 tablet (20 mg total) by mouth daily.    Dispense:  90 tablet    Refill:  1  . FLUoxetine (PROZAC) 10 MG capsule    Sig: Take 1 capsule (10 mg total) by mouth daily for 7 days, THEN 2 capsules (20 mg total) daily for 14 days.    Dispense:  35 capsule    Refill:  0  . hydrOXYzine (ATARAX/VISTARIL) 25 MG tablet    Sig: Take 1 tablet (25 mg total) by mouth 3 (three) times daily as needed.    Dispense:  30 tablet    Refill:  0      Follow up plan: Return in about 2 weeks (around 06/09/2019) for HTN f/u, PAP testing, Weight Management.  Harlin Rain, FNP-C Family Nurse Practitioner [facility] 05/26/2019, 8:32 AM

## 2019-05-27 ENCOUNTER — Other Ambulatory Visit: Payer: Self-pay | Admitting: Family Medicine

## 2019-05-27 DIAGNOSIS — E782 Mixed hyperlipidemia: Secondary | ICD-10-CM

## 2019-05-27 LAB — COMPLETE METABOLIC PANEL WITH GFR
AG Ratio: 1.9 (calc) (ref 1.0–2.5)
ALT: 16 U/L (ref 6–29)
AST: 17 U/L (ref 10–35)
Albumin: 4.5 g/dL (ref 3.6–5.1)
Alkaline phosphatase (APISO): 95 U/L (ref 37–153)
BUN: 10 mg/dL (ref 7–25)
CO2: 28 mmol/L (ref 20–32)
Calcium: 9.7 mg/dL (ref 8.6–10.4)
Chloride: 107 mmol/L (ref 98–110)
Creat: 0.78 mg/dL (ref 0.50–1.05)
GFR, Est African American: 101 mL/min/{1.73_m2} (ref >=60)
GFR, Est Non African American: 87 mL/min/{1.73_m2} (ref >=60)
Globulin: 2.4 g/dL (calc) (ref 1.9–3.7)
Glucose, Bld: 91 mg/dL (ref 65–99)
Potassium: 4.3 mmol/L (ref 3.5–5.3)
Sodium: 140 mmol/L (ref 135–146)
Total Bilirubin: 0.4 mg/dL (ref 0.2–1.2)
Total Protein: 6.9 g/dL (ref 6.1–8.1)

## 2019-05-27 LAB — CBC WITH DIFFERENTIAL/PLATELET
Absolute Monocytes: 602 cells/uL (ref 200–950)
Basophils Absolute: 85 cells/uL (ref 0–200)
Basophils Relative: 0.9 %
Eosinophils Absolute: 254 cells/uL (ref 15–500)
Eosinophils Relative: 2.7 %
HCT: 43.6 % (ref 35.0–45.0)
Hemoglobin: 14.6 g/dL (ref 11.7–15.5)
Lymphs Abs: 2594 cells/uL (ref 850–3900)
MCH: 29.8 pg (ref 27.0–33.0)
MCHC: 33.5 g/dL (ref 32.0–36.0)
MCV: 89 fL (ref 80.0–100.0)
MPV: 10.4 fL (ref 7.5–12.5)
Monocytes Relative: 6.4 %
Neutro Abs: 5866 cells/uL (ref 1500–7800)
Neutrophils Relative %: 62.4 %
Platelets: 260 10*3/uL (ref 140–400)
RBC: 4.9 10*6/uL (ref 3.80–5.10)
RDW: 12.6 % (ref 11.0–15.0)
Total Lymphocyte: 27.6 %
WBC: 9.4 10*3/uL (ref 3.8–10.8)

## 2019-05-27 LAB — URINALYSIS, MICROSCOPIC ONLY
Bacteria, UA: NONE SEEN /HPF
Hyaline Cast: NONE SEEN /LPF
Squamous Epithelial / HPF: NONE SEEN /HPF (ref <=5)
WBC, UA: NONE SEEN /HPF (ref 0–5)

## 2019-05-27 LAB — HEMOGLOBIN A1C
Hgb A1c MFr Bld: 4.9 % of total Hgb (ref <5.7)
Mean Plasma Glucose: 94 (calc)
eAG (mmol/L): 5.2 (calc)

## 2019-05-27 LAB — LIPID PANEL
Cholesterol: 330 mg/dL — ABNORMAL HIGH (ref <200)
HDL: 39 mg/dL — ABNORMAL LOW (ref >=50)
LDL Cholesterol (Calc): 255 mg/dL (calc) — ABNORMAL HIGH
Non-HDL Cholesterol (Calc): 291 mg/dL (calc) — ABNORMAL HIGH (ref <130)
Total CHOL/HDL Ratio: 8.5 (calc) — ABNORMAL HIGH (ref <5.0)
Triglycerides: 182 mg/dL — ABNORMAL HIGH (ref <150)

## 2019-05-27 LAB — HIV ANTIBODY (ROUTINE TESTING W REFLEX): HIV 1&2 Ab, 4th Generation: NONREACTIVE

## 2019-05-27 LAB — THYROID PANEL WITH TSH
Free Thyroxine Index: 2.8 (ref 1.4–3.8)
T3 Uptake: 28 % (ref 22–35)
T4, Total: 10.1 ug/dL (ref 5.1–11.9)
TSH: 1.33 mIU/L

## 2019-05-27 LAB — HEPATITIS C ANTIBODY
Hepatitis C Ab: NONREACTIVE
SIGNAL TO CUT-OFF: 0.18 (ref <1.00)

## 2019-05-27 MED ORDER — ATORVASTATIN CALCIUM 20 MG PO TABS: 20.0000 mg | ORAL_TABLET | Freq: Every day | ORAL | 1 refills | Status: DC

## 2019-05-27 NOTE — Progress Notes (Signed)
Labs look good except cholesterol is elevated and will need medications to manage in addition to diet/exercise and some weight loss.  Will send in Rx for Atorvastatin 20mg  to take nightly at bedtime and repeat lipids in 3 months.  HIV/Hep C non-reactive.  CBC, CMP, thyroid & A1C all within normal limits.

## 2019-06-11 ENCOUNTER — Other Ambulatory Visit: Payer: Self-pay

## 2019-06-11 ENCOUNTER — Ambulatory Visit (INDEPENDENT_AMBULATORY_CARE_PROVIDER_SITE_OTHER): Payer: Managed Care, Other (non HMO) | Admitting: Family Medicine

## 2019-06-11 ENCOUNTER — Other Ambulatory Visit (HOSPITAL_COMMUNITY)
Admission: RE | Admit: 2019-06-11 | Discharge: 2019-06-11 | Disposition: A | Discharge status: Home / Self Care | Payer: Managed Care, Other (non HMO) | Source: Ambulatory Visit | Attending: Family Medicine | Admitting: Family Medicine

## 2019-06-11 ENCOUNTER — Encounter: Payer: Self-pay | Admitting: Family Medicine

## 2019-06-11 VITALS — BP 132/74 | HR 71 | Temp 97.3°F | Ht 62.0 in | Wt 193.4 lb

## 2019-06-11 DIAGNOSIS — I10 Essential (primary) hypertension: Secondary | ICD-10-CM

## 2019-06-11 DIAGNOSIS — Z124 Encounter for screening for malignant neoplasm of cervix: Secondary | ICD-10-CM

## 2019-06-11 DIAGNOSIS — N9089 Other specified noninflammatory disorders of vulva and perineum: Secondary | ICD-10-CM

## 2019-06-11 DIAGNOSIS — F419 Anxiety disorder, unspecified: Secondary | ICD-10-CM

## 2019-06-11 DIAGNOSIS — Z01419 Encounter for gynecological examination (general) (routine) without abnormal findings: Secondary | ICD-10-CM | POA: Insufficient documentation

## 2019-06-11 LAB — RESULTS CONSOLE HPV: CHL HPV: NEGATIVE

## 2019-06-11 MED ORDER — HYDROXYZINE HCL 10 MG PO TABS: 10.0000 mg | ORAL_TABLET | Freq: Every evening | ORAL | 0 refills | Status: DC

## 2019-06-11 NOTE — Patient Instructions (Signed)
We have sent your PAP to the lab for testing.  Will contact you once the results are received.  We have switched your hydroxyzine from 25mg  at night to 10mg  at night to help reduce daytime drowsiness the next day.  Call and schedule your Mammogram.  Complete and mail back your Cologuard.  Try to get exercise a minimum of 30 minutes per day at least 5 days per week as well as  adequate water intake all while measuring blood pressure a few times per week.  Keep a blood pressure log and bring back to clinic at your next visit.  If your readings are consistently over 140/90 to contact our office/send me a MyChart message and we will see you sooner.  Can try DASH and Mediterranean diet options, avoiding processed foods, lowering sodium intake, avoiding pork products, and eating a plant based diet for optimal health.      Mediterranean Diet  Why follow it? Research shows. . Those who follow the Mediterranean diet have a reduced risk of heart disease  . The diet is associated with a reduced incidence of Parkinson's and Alzheimer's diseases . People following the diet may have longer life expectancies and lower rates of chronic diseases  . The Dietary Guidelines for Americans recommends the Mediterranean diet as an eating plan to promote health and prevent disease  What Is the Mediterranean Diet?  . Healthy eating plan based on typical foods and recipes of Mediterranean-style cooking . The diet is primarily a plant based diet; these foods should make up a majority of meals   Starches - Plant based foods should make up a majority of meals - They are an important sources of vitamins, minerals, energy, antioxidants, and fiber - Choose whole grains, foods high in fiber and minimally processed items  - Typical grain sources include wheat, oats, barley, corn, brown rice, bulgar, farro, millet, polenta, couscous  - Various types of beans include chickpeas, lentils, fava beans, black beans, white beans    Fruits  Veggies - Large quantities of antioxidant rich fruits & veggies; 6 or more servings  - Vegetables can be eaten raw or lightly drizzled with oil and cooked  - Vegetables common to the traditional Mediterranean Diet include: artichokes, arugula, beets, broccoli, brussel sprouts, cabbage, carrots, celery, collard greens, cucumbers, eggplant, kale, leeks, lemons, lettuce, mushrooms, okra, onions, peas, peppers, potatoes, pumpkin, radishes, rutabaga, shallots, spinach, sweet potatoes, turnips, zucchini - Fruits common to the Mediterranean Diet include: apples, apricots, avocados, cherries, clementines, dates, figs, grapefruits, grapes, melons, nectarines, oranges, peaches, pears, pomegranates, strawberries, tangerines  Fats - Replace butter and margarine with healthy oils, such as olive oil, canola oil, and tahini  - Limit nuts to no more than a handful a day  - Nuts include walnuts, almonds, pecans, pistachios, pine nuts  - Limit or avoid candied, honey roasted or heavily salted nuts - Olives are central to the - can be eaten whole or used in a variety of dishes   Meats Protein - Limiting red meat: no more than a few times a month - When eating red meat: choose lean cuts and keep the portion to the size of deck of cards - Eggs: approx. 0 to 4 times a week  - Fish and lean poultry: at least 2 a week  - Healthy protein sources include, chicken, , lean beef, lamb - Increase intake of seafood such as tuna, salmon, trout, mackerel, shrimp, scallops - Avoid or limit high fat processed meats such  as sausage and bacon  Dairy - Include moderate amounts of low fat dairy products  - Focus on healthy dairy such as fat free yogurt, skim milk, low or reduced fat cheese - Limit dairy products higher in fat such as whole or 2% milk, cheese, ice cream  Alcohol - Moderate amounts of red wine is ok  - No more than 5 oz daily for women (all ages) and men older than age 24  - No more  than 10 oz of wine daily for men younger than 43  Other - Limit sweets and other desserts  - Use herbs and spices instead of salt to flavor foods  - Herbs and spices common to the traditional Mediterranean Diet include: basil, bay leaves, chives, cloves, cumin, fennel, garlic, lavender, marjoram, mint, oregano, parsley, pepper, rosemary, sage, savory, sumac, tarragon, thyme   It's not just a diet, it's a lifestyle:  . The Mediterranean diet includes lifestyle factors typical of those in the region  . Foods, drinks and meals are best eaten with others and savored . Daily physical activity is important for overall good health . This could be strenuous exercise like running and aerobics . This could also be more leisurely activities such as walking, housework, yard-work, or taking the stairs . Moderation is the key; a balanced and healthy diet accommodates most foods and drinks . Consider portion sizes and frequency of consumption of certain foods   Meal Ideas & Options:  . Breakfast:  o Whole wheat toast or whole wheat English muffins with peanut butter & hard boiled egg o Steel cut oats topped with apples & cinnamon and skim milk  o Fresh fruit: banana, strawberries, melon, berries, peaches  o Smoothies: strawberries, bananas, greek yogurt, peanut butter o Low fat greek yogurt with blueberries and granola  o Egg white omelet with spinach and mushrooms o Breakfast couscous: whole wheat couscous, apricots, skim milk, cranberries  . Sandwiches:  o Hummus and grilled vegetables (peppers, zucchini, squash) on whole wheat bread   o Grilled chicken on whole wheat pita with lettuce, tomatoes, cucumbers or tzatziki  o Tuna salad on whole wheat bread: tuna salad made with greek yogurt, olives, red peppers, capers, green onions o Garlic rosemary lamb pita: lamb sauted with garlic, rosemary, salt & pepper; add lettuce, cucumber, greek yogurt to pita - flavor with lemon juice and black pepper   . Seafood:  o Mediterranean grilled salmon, seasoned with garlic, basil, parsley, lemon juice and black pepper o Shrimp, lemon, and spinach whole-grain pasta salad made with low fat greek yogurt  o Seared scallops with lemon orzo  o Seared tuna steaks seasoned salt, pepper, coriander topped with tomato mixture of olives, tomatoes, olive oil, minced garlic, parsley, green onions and cappers  . Meats:  o Herbed greek chicken salad with kalamata olives, cucumber, feta  o Red bell peppers stuffed with spinach, bulgur, lean ground beef (or lentils) & topped with feta   o Kebabs: skewers of chicken, tomatoes, onions, zucchini, squash  o [name] burgers: made with red onions, mint, dill, lemon juice, feta cheese topped with roasted red peppers . Vegetarian o Cucumber salad: cucumbers, artichoke hearts, celery, red onion, feta cheese, tossed in olive oil & lemon juice  o Hummus and whole grain pita points with a greek salad (lettuce, tomato, feta, olives, cucumbers, red onion) o Lentil soup with celery, carrots made with vegetable broth, garlic, salt and pepper  o Tabouli salad: parsley, bulgur, mint, scallions, cucumbers, tomato, radishes, lemon  juice, olive oil, salt and pepper.  We will plan to see you back in 3 months for hypertension follow up.  You will receive a survey after today's visit either digitally by e-mail or paper by [employer]. Your experiences and feedback matter to [place].  Please respond so we know how we are doing as we provide care for you.  Call us with any questions/concerns/needs.  It is my goal to be available to you for your health concerns.  Thanks for choosing me to be a partner in your healthcare needs!  Harlin Rain, FNP-C Family Nurse Practitioner [facility] Phone: [phone]

## 2019-06-11 NOTE — Progress Notes (Signed)
Subjective:    Patient ID: [name], female    DOB: 1965-03-30, 54 y.o.   MRN: [id number]  [name] is a 54 y.o. female presenting on 06/11/2019 for Gynecologic Exam   HPI  Hypertension - She is not checking BP at home or outside of clinic.    - Current medications: lisinopril 20mg  daily, tolerating well without side effects - She is not currently symptomatic. - Pt denies headache, lightheadedness, dizziness, changes in vision, chest tightness/pressure, palpitations, leg swelling, sudden loss of speech or loss of consciousness. - She  reports no regular exercise routine. - Her diet is moderate in salt, moderate in fat, and moderate in carbohydrates.  Has been taking the fluoxetine and hydroxyzine and tolerating well.  Believes the hydroxyzine may be causing her some next day drowsiness.  Reports good relief of her anxiety.  Has concerns for a skin tag on her perineum that she would like to have removed.  Requested referral to dermatology.  Depression screen [facility] 2/9 07/07/2018 04/06/2018 03/23/2018  Decreased Interest 0 0 0  Down, Depressed, Hopeless 0 0 0  PHQ - 2 Score 0 0 0  Altered sleeping 0 - 2  Tired, decreased energy 1 - 3  Change in appetite 0 - 0  Feeling bad or failure about yourself  0 - 0  Trouble concentrating 0 - 0  Moving slowly or fidgety/restless 0 - 0  Suicidal thoughts 0 - 0  PHQ-9 Score 1 - 5  Difficult doing work/chores Not difficult at all - -    Social History   Tobacco Use  . Smoking status: Current Every Day Smoker    Packs/day: 1.50    Years: 25.00    Pack years: 37.50    Types: Cigarettes    Start date: 03/23/1993  . Smokeless tobacco: Never Used  Substance Use Topics  . Alcohol use: No    Alcohol/week: 0.0 standard drinks  . Drug use: No    Review of Systems  Constitutional: Negative.   HENT: Negative.   Eyes: Negative.   Respiratory: Negative.   Cardiovascular: Negative.   Gastrointestinal: Negative.   Endocrine: Negative.    Genitourinary: Negative.   Musculoskeletal: Negative.   Skin: Negative.   Allergic/Immunologic: Negative.   Neurological: Negative.   Hematological: Negative.   Psychiatric/Behavioral: Negative.    Per HPI unless specifically indicated above     Objective:    BP 132/74 (BP Location: Left Arm, Patient Position: Sitting, Cuff Size: Large)   Pulse 71   Temp (!) 97.3 F (36.3 C) (Temporal)   Ht 5\' 2"  (1.575 m)   Wt 193 lb 6.4 oz (87.7 kg)   SpO2 100%   BMI 35.37 kg/m   Wt Readings from Last 3 Encounters:  06/11/19 193 lb 6.4 oz (87.7 kg)  05/26/19 194 lb 9.6 oz (88.3 kg)  04/06/18 178 lb (80.7 kg)    Physical Exam Vitals reviewed.  Constitutional:      General: She is not in acute distress.    Appearance: Normal appearance. She is well-developed and well-groomed. She is obese. She is not ill-appearing or toxic-appearing.  HENT:     Head: Normocephalic.  Eyes:     General: Lids are normal. Vision grossly intact.        Right eye: No discharge.        Left eye: No discharge.     Extraocular Movements: Extraocular movements intact.     Conjunctiva/sclera: Conjunctivae normal.  Pupils: Pupils are equal, round, and reactive to light.  Cardiovascular:     Pulses: Normal pulses.  Pulmonary:     Effort: Pulmonary effort is normal.  Abdominal:     General: There is no distension.     Palpations: Abdomen is soft.     Hernia: There is no hernia in the left inguinal area or right inguinal area.  Genitourinary:    General: Normal vulva.     Exam position: Lithotomy position.     Pubic Area: No rash or pubic lice.      Labia:        Right: Lesion present. No rash, tenderness or injury.        Left: No rash, tenderness, lesion or injury.      Urethra: No urethral pain, urethral swelling or urethral lesion.     Vagina: Normal.     Cervix: Normal.     Uterus: Normal. Not tender.      Adnexa: Right adnexa normal and left adnexa normal.       Comments: Skin tag - see  image Lymphadenopathy:     Lower Body: No right inguinal adenopathy. No left inguinal adenopathy.  Skin:    General: Skin is warm and dry.     Capillary Refill: Capillary refill takes less than 2 seconds.  Neurological:     General: No focal deficit present.     Mental Status: She is alert and oriented to person, place, and time.     Cranial Nerves: No cranial nerve deficit.     Sensory: No sensory deficit.     Motor: No weakness.     Coordination: Coordination normal.     Gait: Gait normal.  Psychiatric:        Attention and Perception: Attention and perception normal.        Mood and Affect: Mood and affect normal.        Speech: Speech normal.        Behavior: Behavior normal. Behavior is cooperative.        Thought Content: Thought content normal.        Cognition and Memory: Cognition and memory normal.        Judgment: Judgment normal.     Results for orders placed or performed in visit on 05/26/19  Urinalysis, microscopic only  Result Value Ref Range   WBC, UA NONE SEEN 0 - 5 /HPF   RBC / HPF 0-2 0 - 2 /HPF   Squamous Epithelial / LPF NONE SEEN < OR = 5 /HPF   Bacteria, UA NONE SEEN NONE SEEN /HPF   Hyaline Cast NONE SEEN NONE SEEN /LPF  CBC with Differential  Result Value Ref Range   WBC 9.4 3.8 - 10.8 Thousand/uL   RBC 4.90 3.80 - 5.10 Million/uL   Hemoglobin 14.6 11.7 - 15.5 g/dL   HCT 81.1 91.4 - 78.2 %   MCV 89.0 80.0 - 100.0 fL   MCH 29.8 27.0 - 33.0 pg   MCHC 33.5 32.0 - 36.0 g/dL   RDW 95.6 21.3 - 08.6 %   Platelets 260 140 - 400 Thousand/uL   MPV 10.4 7.5 - 12.5 fL   Neutro Abs 5,866 1,500 - 7,800 cells/uL   Lymphs Abs 2,594 850 - 3,900 cells/uL   Absolute Monocytes 602 200 - 950 cells/uL   Eosinophils Absolute 254 15 - 500 cells/uL   Basophils Absolute 85 0 - 200 cells/uL   Neutrophils Relative % 62.4 %   Total  Lymphocyte 27.6 %   Monocytes Relative 6.4 %   Eosinophils Relative 2.7 %   Basophils Relative 0.9 %  COMPLETE METABOLIC PANEL WITH GFR    Result Value Ref Range   Glucose, Bld 91 65 - 99 mg/dL   BUN 10 7 - 25 mg/dL   Creat 5.62 1.30 - 8.65 mg/dL   GFR, Est Non African American 87 > OR = 60 mL/min/1.60m2   GFR, Est African American 101 > OR = 60 mL/min/1.41m2   BUN/Creatinine Ratio NOT APPLICABLE 6 - 22 (calc)   Sodium 140 135 - 146 mmol/L   Potassium 4.3 3.5 - 5.3 mmol/L   Chloride 107 98 - 110 mmol/L   CO2 28 20 - 32 mmol/L   Calcium 9.7 8.6 - 10.4 mg/dL   Total Protein 6.9 6.1 - 8.1 g/dL   Albumin 4.5 3.6 - 5.1 g/dL   Globulin 2.4 1.9 - 3.7 g/dL (calc)   AG Ratio 1.9 1.0 - 2.5 (calc)   Total Bilirubin 0.4 0.2 - 1.2 mg/dL   Alkaline phosphatase (APISO) 95 37 - 153 U/L   AST 17 10 - 35 U/L   ALT 16 6 - 29 U/L  Lipid Profile  Result Value Ref Range   Cholesterol 330 (H) <200 mg/dL   HDL 39 (L) > OR = 50 mg/dL   Triglycerides 784 (H) <150 mg/dL   LDL Cholesterol (Calc) 255 (H) mg/dL (calc)   Total CHOL/HDL Ratio 8.5 (H) <5.0 (calc)   Non-HDL Cholesterol (Calc) 291 (H) <130 mg/dL (calc)  Thyroid Panel With TSH  Result Value Ref Range   T3 Uptake 28 22 - 35 %   T4, Total 10.1 5.1 - 11.9 mcg/dL   Free Thyroxine Index 2.8 1.4 - 3.8   TSH 1.33 mIU/L  HgB A1c  Result Value Ref Range   Hgb A1c MFr Bld 4.9 <5.7 % of total Hgb   Mean Plasma Glucose 94 (calc)   eAG (mmol/L) 5.2 (calc)  Hepatitis C antibody  Result Value Ref Range   Hepatitis C Ab NON-REACTIVE NON-REACTI   SIGNAL TO CUT-OFF 0.18 <1.00  HIV Antibody (routine testing w rflx)  Result Value Ref Range   HIV 1&2 Ab, 4th Generation NON-REACTIVE NON-REACTI  POCT Urinalysis Dipstick  Result Value Ref Range   Color, UA Yellow    Clarity, UA Clear    Glucose, UA Negative Negative   Bilirubin, UA negative    Ketones, UA negative    Spec Grav, UA 1.015 1.010 - 1.025   Blood, UA moderate    pH, UA 5.0 5.0 - 8.0   Protein, UA Negative Negative   Urobilinogen, UA 0.2 0.2 or 1.0 E.U./dL   Nitrite, UA negative    Leukocytes, UA Negative Negative    Appearance     Odor        Assessment & Plan:   Problem List Items Addressed This Visit      Cardiovascular and Mediastinum   Essential hypertension    Currently controlled hypertension.  BP is close to goal < 130/80.  Pt is working on lifestyle modifications.  Taking medications tolerating well without side effects. Complications - obesity, hyperlipidemia  Plan: 1. Continue taking Lisinopril 20mg  daily 2. Encouraged heart healthy diet and increasing exercise to 30 minutes most days of the week, going no more than 2 days in a row without exercise. 3. Check BP 1-2 x per week at home, keep log, and bring to clinic at next appointment. 4. Follow up  3 months.           Other   Anxiety - Primary    Currently stable and well controlled with fluoxetine 20mg  daily and hydroxyzine 25mg  nightly.  Reports that may have some daytime drowsiness still lingering after taking the hydroxyzine at night.  Discussed moving the 25mg  dose to earlier at night and could look at reducing to 10mg  nightly.    Plan: 1. Continue fluoxetine 20mg  daily 2. START Hydroxyzine 10mg  nightly and let us know if we need to change dosage back to 25mg . 3. Follow up in 3 months, sooner, should the need arise.      Relevant Medications   hydrOXYzine (ATARAX/VISTARIL) 10 MG tablet   Well woman exam with routine gynecological exam    Well woman exam with GYN exam completed.  Plan: 1. Pap testing completed and sent to lab       Other Visit Diagnoses    Screening for cervical cancer       Relevant Orders   Cytology - PAP   Skin tag of female perineum       Relevant Orders   Ambulatory referral to Dermatology      Meds ordered this encounter  Medications  . hydrOXYzine (ATARAX/VISTARIL) 10 MG tablet    Sig: Take 1 tablet (10 mg total) by mouth at bedtime.    Dispense:  30 tablet    Refill:  0      Follow up plan: Return in about 3 months (around 09/10/2019) for HTN F/U.   Harlin Rain,  [place] [facility] Nurse Practitioner [facility] 06/11/2019, 3:14 PM

## 2019-06-11 NOTE — Assessment & Plan Note (Signed)
Currently controlled hypertension.  BP is close to goal < 130/80.  Pt is working on lifestyle modifications.  Taking medications tolerating well without side effects. Complications - obesity, hyperlipidemia  Plan: 1. Continue taking Lisinopril 20mg  daily 2. Encouraged heart healthy diet and increasing exercise to 30 minutes most days of the week, going no more than 2 days in a row without exercise. 3. Check BP 1-2 x per week at home, keep log, and bring to clinic at next appointment. 4. Follow up 3 months.

## 2019-06-11 NOTE — Assessment & Plan Note (Signed)
Well woman exam with GYN exam completed.  Plan: 1. Pap testing completed and sent to lab

## 2019-06-11 NOTE — Assessment & Plan Note (Signed)
Currently stable and well controlled with fluoxetine 20mg  daily and hydroxyzine 25mg  nightly.  Reports that may have some daytime drowsiness still lingering after taking the hydroxyzine at night.  Discussed moving the 25mg  dose to earlier at night and could look at reducing to 10mg  nightly.    Plan: 1. Continue fluoxetine 20mg  daily 2. START Hydroxyzine 10mg  nightly and let know if we need to change dosage back to 25mg . 3. Follow up in 3 months, sooner, should the need arise.

## 2019-06-15 LAB — CYTOLOGY - PAP: Diagnosis: NEGATIVE

## 2019-07-02 ENCOUNTER — Other Ambulatory Visit: Payer: Self-pay | Admitting: Family Medicine

## 2019-07-02 DIAGNOSIS — F419 Anxiety disorder, unspecified: Secondary | ICD-10-CM

## 2019-07-02 NOTE — Telephone Encounter (Signed)
Requested medication (s) are due for refill today: yes  Requested medication (s) are on the active medication list: yes  Last refill:  prescription expired, instructions written for 14 days  Future visit scheduled: no  Notes to clinic:  Current prescription expired. Please review for refill.     Requested Prescriptions  Pending Prescriptions Disp Refills   FLUoxetine (PROZAC) 10 MG capsule [Pharmacy Med Name: FLUOXETINE HCL 10 MG CAPSULE] 35 capsule 0    Sig: Take 1 capsule (10 mg total) by mouth daily for 7 days, THEN2 capsules (20 mg total) daily for 14 days.      Psychiatry:  Antidepressants - SSRI Passed - 07/02/2019  5:45 PM      Passed - Valid encounter within last 6 months    Recent Outpatient Visits           3 weeks ago Anxiety   [facility], Jodelle Gross, FNP   1 month ago Annual physical exam   [facility], Jodelle Gross, FNP   12 months ago Tobacco abuse   [facility] Smitty Cords, DO   1 year ago Essential hypertension   [facility] Galen Manila, NP   1 year ago Mixed hyperlipidemia   [facility] Kyung Rudd, Alison Stalling, NP       Future Appointments             In 2 months Willeen Niece, MD [facility]

## 2019-07-25 ENCOUNTER — Encounter: Payer: Self-pay | Admitting: Family Medicine

## 2019-07-26 ENCOUNTER — Other Ambulatory Visit: Payer: Self-pay

## 2019-07-26 ENCOUNTER — Emergency Department
Admission: EM | Admit: 2019-07-26 | Discharge: 2019-07-26 | Disposition: A | Discharge status: Home / Self Care | Payer: Managed Care, Other (non HMO) | Attending: Student in an Organized Health Care Education/Training Program | Admitting: Student in an Organized Health Care Education/Training Program

## 2019-07-26 ENCOUNTER — Encounter: Payer: Self-pay | Admitting: Emergency Medicine

## 2019-07-26 ENCOUNTER — Emergency Department: Payer: Managed Care, Other (non HMO)

## 2019-07-26 DIAGNOSIS — S3992XA Unspecified injury of lower back, initial encounter: Secondary | ICD-10-CM | POA: Diagnosis present

## 2019-07-26 DIAGNOSIS — Z79899 Other long term (current) drug therapy: Secondary | ICD-10-CM | POA: Insufficient documentation

## 2019-07-26 DIAGNOSIS — F17210 Nicotine dependence, cigarettes, uncomplicated: Secondary | ICD-10-CM | POA: Insufficient documentation

## 2019-07-26 DIAGNOSIS — X500XXA Overexertion from strenuous movement or load, initial encounter: Secondary | ICD-10-CM | POA: Insufficient documentation

## 2019-07-26 DIAGNOSIS — S39012A Strain of muscle, fascia and tendon of lower back, initial encounter: Secondary | ICD-10-CM

## 2019-07-26 DIAGNOSIS — Y929 Unspecified place or not applicable: Secondary | ICD-10-CM | POA: Insufficient documentation

## 2019-07-26 DIAGNOSIS — Y999 Unspecified external cause status: Secondary | ICD-10-CM | POA: Insufficient documentation

## 2019-07-26 DIAGNOSIS — Y9389 Activity, other specified: Secondary | ICD-10-CM | POA: Insufficient documentation

## 2019-07-26 MED ORDER — OXYCODONE-ACETAMINOPHEN 7.5-325 MG PO TABS: 1.0000 | ORAL_TABLET | Freq: Four times a day (QID) | ORAL | 0 refills | Status: DC | PRN

## 2019-07-26 MED ORDER — CYCLOBENZAPRINE HCL 10 MG PO TABS: 10.0000 mg | ORAL_TABLET | Freq: Three times a day (TID) | ORAL | 0 refills | Status: DC | PRN

## 2019-07-26 MED ORDER — ORPHENADRINE CITRATE 30 MG/ML IJ SOLN
60.0000 mg | Freq: Two times a day (BID) | INTRAMUSCULAR | Status: DC
  Administered 2019-07-26: 60 mg via INTRAMUSCULAR
  Filled 2019-07-26: qty 2

## 2019-07-26 MED ORDER — HYDROMORPHONE HCL 1 MG/ML IJ SOLN
1.0000 mg | Freq: Once | INTRAMUSCULAR | Status: AC
  Administered 2019-07-26: 1 mg via INTRAMUSCULAR
  Filled 2019-07-26: qty 1

## 2019-07-26 NOTE — ED Provider Notes (Signed)
[facility] Emergency Department Provider Note   ____________________________________________   First MD Initiated Contact with Patient 07/26/19 [phone]     (approximate)  I have reviewed the triage vital signs and the nursing notes.   HISTORY  Chief Complaint Back Pain    HPI [name] is a 54 y.o. female patient complain acute back pain secondary to flex lifting incident which occurred 2 days ago.  Patient states she was bending and removing laundry out of her hand.  When she stood up she is there is acute back pain.  Patient denies radicular component to her back pain.  Patient has bladder bowel dysfunction.  Nasal history of 4 lumbar surgeries requiring internal fixations.  Patient states she has had intermittent back pain over the years with this the first time she is had acute back pain.  Rates her pain as a 10/10.  Describes the pain as "achy".  No relief with over-the-counter medications.  Patient blood pressure is elevated.  Patient states she has not taken her medication this morning.         Past Medical History:  Diagnosis Date  . Anxiety   . Fatigue   . Hyperlipemia   . Irregular menstrual cycle   . Obesity     Patient Active Problem List   Diagnosis Date Noted  . Well woman exam with routine gynecological exam 06/11/2019  . Routine medical exam 05/26/2019  . Essential hypertension 02/11/2018  . Undiagnosed cardiac murmurs 01/27/2018  . Tendonitis of ankle, right 05/13/2016  . Chest pain with low risk for cardiac etiology 03/08/2016  . Anxiety 02/08/2016  . Mixed hyperlipidemia 02/08/2016  . Obesity 02/08/2016  . Tobacco user 02/08/2016  . Vaginal dryness 02/08/2016  . Menopause 02/08/2016    Past Surgical History:  Procedure Laterality Date  . BACK SURGERY     x 4    Prior to Admission medications   Medication Sig Start Date End Date Taking? Authorizing Provider  atorvastatin (LIPITOR) 20 MG tablet Take 1 tablet (20 mg  total) by mouth daily. 05/27/19   Malfi, Jodelle Gross, FNP  cyclobenzaprine (FLEXERIL) 10 MG tablet Take 1 tablet (10 mg total) by mouth 3 (three) times daily as needed. 07/26/19   Joni Reining, PA-C  FLUoxetine (PROZAC) 10 MG capsule Take 2 capsules (20 mg total) by mouth daily. 07/05/19   Malfi, Jodelle Gross, FNP  hydrOXYzine (ATARAX/VISTARIL) 10 MG tablet Take 1 tablet (10 mg total) by mouth at bedtime. 06/11/19   Malfi, Jodelle Gross, FNP  lisinopril (ZESTRIL) 20 MG tablet Take 1 tablet (20 mg total) by mouth daily. 05/26/19   Malfi, Jodelle Gross, FNP  oxyCODONE-acetaminophen (PERCOCET) 7.5-325 MG tablet Take 1 tablet by mouth every 6 (six) hours as needed for severe pain. 07/26/19   Joni Reining, PA-C    Allergies Patient has no known allergies.  Family History  Problem Relation Age of Onset  . Heart disease Father   . Hypertension Father   . Thyroid disease Father   . Heart attack Father 90  . CAD Father   . Kidney failure Father   . Hypertension Mother   . Heart failure Maternal Grandmother   . Lung cancer Maternal Grandfather   . Cancer Paternal Grandmother   . Coronary artery disease Paternal Grandmother   . Cancer Paternal Grandfather   . Diabetes Neg Hx     Social History Social History   Tobacco Use  . Smoking status: Current Every Day  Smoker    Packs/day: 1.50    Years: 25.00    Pack years: 37.50    Types: Cigarettes    Start date: 03/23/1993  . Smokeless tobacco: Never Used  Substance Use Topics  . Alcohol use: No    Alcohol/week: 0.0 standard drinks  . Drug use: No    Review of Systems Constitutional: No fever/chills Eyes: No visual changes. ENT: No sore throat. Cardiovascular: Denies chest pain. Respiratory: Denies shortness of breath. Gastrointestinal: No abdominal pain.  No nausea, no vomiting.  No diarrhea.  No constipation. Genitourinary: Negative for dysuria. Musculoskeletal: Positive for back pain. Skin: Negative for rash. Neurological: Negative for  headaches, focal weakness or numbness. Psychiatric:  Anxiety Endocrine:  Hyperlipidemia and hypertension.   ____________________________________________   PHYSICAL EXAM:  VITAL SIGNS: ED Triage Vitals  Enc Vitals Group     BP 07/26/19 0754 (!) 197/79     Pulse Rate 07/26/19 0752 83     Resp 07/26/19 0752 16     Temp 07/26/19 0752 97.6 F (36.4 C)     Temp Source 07/26/19 0752 Oral     SpO2 07/26/19 0752 98 %     Weight 07/26/19 0752 178 lb (80.7 kg)     Height 07/26/19 0752 5\' 2"  (1.575 m)     Head Circumference --      Peak Flow --      Pain Score 07/26/19 0752 10     Pain Loc --      Pain Edu? --      Excl. in GC? --     Constitutional: Alert and oriented.  Moderate distress.  Patient rather stand and sit.  Obesity. Eyes: Conjunctivae are normal. PERRL. EOMI. Head: Atraumatic. Nose: No congestion/rhinnorhea. Mouth/Throat: Mucous membranes are moist.  Oropharynx non-erythematous. Neck: No cervical spine tenderness to palpation. Hematological/Lymphatic/Immunilogical: No cervical lymphadenopathy. Cardiovascular: Normal rate, regular rhythm. Grossly normal heart sounds.  Good peripheral circulation. Respiratory: Normal respiratory effort.  No retractions. Lungs CTAB. Gastrointestinal: Soft and nontender. No distention. No abdominal bruits. No CVA tenderness. Genitourinary: Deferred Musculoskeletal: No obvious spinal deformity.  Patient is moderate guarding palpation of L3-L5.  Patient has decreased range of motion is all fields.  No lower extremity tenderness nor edema.  No joint effusions. Neurologic:  Normal speech and language. No gross focal neurologic deficits are appreciated. No gait instability. Skin:  Skin is warm, dry and intact. No rash noted. Psychiatric: Mood and affect are normal. Speech and behavior are normal.  ____________________________________________   LABS (all labs ordered are listed, but only abnormal results are displayed)  Labs Reviewed - No  data to display ____________________________________________  EKG   ____________________________________________  RADIOLOGY  ED MD interpretation:    Official radiology report(s): DG Lumbar Spine Complete  Result Date: 07/26/2019 CLINICAL DATA:  Acute lumbar pain EXAM: LUMBAR SPINE - COMPLETE 4+ VIEW COMPARISON:  2012 FINDINGS: Numbering is kept consistent with the prior study with postoperative changes at L4-L5. Stable vertebral body heights. There is mild retrolisthesis at L3-L4. Mild multilevel disc space narrowing and endplate osteophytes. Mild facet hypertrophy. IMPRESSION: No acute abnormality. Electronically Signed   By: 2013 M.D.   On: 07/26/2019 09:26    ____________________________________________   PROCEDURES  Procedure(s) performed (including Critical Care):  Procedures   ____________________________________________   INITIAL IMPRESSION / ASSESSMENT AND PLAN / ED COURSE  As part of my medical decision making, I reviewed the following data within the electronic MEDICAL RECORD NUMBER     Patient presents  with acute low back pain secondary to lifting bending incident.  Discussed no acute findings on x-ray of the lumbar spine.  Patient complaint physical exam is consistent with lumbar strain.  Patient given discharge care instruction advised take medication as directed.  Patient advised follow-up PCP.    ANNITTA FIFIELD was evaluated in Emergency Department on 07/26/2019 for the symptoms described in the history of present illness. She was evaluated in the context of the global COVID-19 pandemic, which necessitated consideration that the patient might be at risk for infection with the SARS-CoV-2 virus that causes COVID-19. Institutional protocols and algorithms that pertain to the evaluation of patients at risk for COVID-19 are in a state of rapid change based on information released by regulatory bodies including the CDC and federal and state organizations. These  policies and algorithms were followed during the patient's care in the ED.       ____________________________________________   FINAL CLINICAL IMPRESSION(S) / ED DIAGNOSES  Final diagnoses:  Strain of lumbar region, initial encounter     ED Discharge Orders         Ordered    oxyCODONE-acetaminophen (PERCOCET) 7.5-325 MG tablet  Every 6 hours PRN     07/26/19 0936    cyclobenzaprine (FLEXERIL) 10 MG tablet  3 times daily PRN     07/26/19 0936           Note:  This document was prepared using Dragon voice recognition software and may include unintentional dictation errors.    Sable Feil, PA-C 07/26/19 6063    Merlyn Lot, MD 07/26/19 1046

## 2019-07-26 NOTE — ED Triage Notes (Signed)
Pt here for lower back pain. Starts in lower lumbar area and radiates across lower back. No loss bowel or bladder.  Started after bending and getting laundry out of hamper and turning to put in washer. Pain worse with movement. Has had back surgery in past X 4

## 2019-07-26 NOTE — ED Notes (Signed)
See triage note  Presents with pain to lower back  States she bent down to get clothes out of her basket  Felt the pain  States pain is non radiating   Ambulates well

## 2019-09-14 ENCOUNTER — Ambulatory Visit: Payer: Managed Care, Other (non HMO) | Admitting: Dermatology

## 2019-10-07 ENCOUNTER — Ambulatory Visit (INDEPENDENT_AMBULATORY_CARE_PROVIDER_SITE_OTHER): Payer: Managed Care, Other (non HMO) | Admitting: Family Medicine

## 2019-10-07 ENCOUNTER — Other Ambulatory Visit: Payer: Self-pay

## 2019-10-07 ENCOUNTER — Encounter: Payer: Self-pay | Admitting: Family Medicine

## 2019-10-07 VITALS — Temp 98.0°F

## 2019-10-07 DIAGNOSIS — R059 Cough, unspecified: Secondary | ICD-10-CM | POA: Insufficient documentation

## 2019-10-07 DIAGNOSIS — R05 Cough: Secondary | ICD-10-CM | POA: Diagnosis not present

## 2019-10-07 DIAGNOSIS — J029 Acute pharyngitis, unspecified: Secondary | ICD-10-CM

## 2019-10-07 DIAGNOSIS — R0989 Other specified symptoms and signs involving the circulatory and respiratory systems: Secondary | ICD-10-CM | POA: Diagnosis not present

## 2019-10-07 DIAGNOSIS — R0981 Nasal congestion: Secondary | ICD-10-CM

## 2019-10-07 MED ORDER — PROMETHAZINE-DM 6.25-15 MG/5ML PO SYRP: 5.0000 mL | ORAL_SOLUTION | Freq: Four times a day (QID) | ORAL | 0 refills | Status: DC | PRN

## 2019-10-07 NOTE — Assessment & Plan Note (Signed)
Likely PND causing cough and sore throat.  Has COVID testing today at 3pm for evaluation that takes 2 days for results.  Discussed symptomatic treatment with OTC coriciden HBP due to history of HTN, along with Afrin nasal spray to work as a decongestant and promethazine DM to take as needed for cough.  Plan: 1. Take medications as directed 2. Complete COVID testing at [facility] today at 3pm and notify us once receive results 3. Schedule follow up visit as needed

## 2019-10-07 NOTE — Patient Instructions (Signed)
As we discussed, we can help with treating your symptoms.  Can take over the counter Afrin nasal spray to help with nasal congestion for up to 3 days in a row.  Do not use for more than 3 days to prevent rebound nasal congestion.  You can safely take over the counter Coriciden HBP for your symptoms with your history of high blood pressure.  I have sent in a prescription for promethazine DM to take as directed, for cough.  This medication may make you tired/drowsy.  Do not drive or operate heavy machinery while using this.  Complete your COVID test today and let us know when you receive the results.  We will plan to see you back as needed for this  You will receive a survey after today's visit either digitally by e-mail or paper by USPS mail. Your experiences and feedback matter to [place].  Please respond so we know how we are doing as we provide care for you.  Call us with any questions/concerns/needs.  It is my goal to be available to you for your health concerns.  Thanks for choosing me to be a partner in your healthcare needs!  Charlaine Dalton, FNP-C Family Nurse Practitioner [facility] Phone: [phone]

## 2019-10-07 NOTE — Progress Notes (Signed)
Virtual Visit via Telephone  The purpose of this virtual visit is to provide medical care while limiting exposure to the novel coronavirus (COVID19) for both patient and office staff.  Consent was obtained for phone visit:  Yes.   Answered questions that patient had about telehealth interaction:  Yes.   I discussed the limitations, risks, security and privacy concerns of performing an evaluation and management service by telephone. I also discussed with the patient that there may be a patient responsible charge related to this service. The patient expressed understanding and agreed to proceed.  Patient is at home and is accessed via telephone Services are provided by Charlaine Dalton, FNP-C from [facility])  ---------------------------------------------------------------------- Chief Complaint  Patient presents with  . URI    head congestion, sneezing, coughing, sore throat, and runny nose x 2 days. She denies fever and chills. Pt only received one vaccine on 09/20/2019. She also have an appt for COVID test at 3:00pm.     S: Reviewed CMA documentation. I have called patient and gathered additional HPI as follows:  [name] presents for telemedicine visit for concerns of head congestion, sneezing, cough, sore throat, with runny nose x 2 days.  Has tried robitussin, mucinex, and delsym cough medicine without improvement in her symptoms.  Reports she has a drive through COVID test today at 3pm with [facility].  Denies fevers, chills, change in taste/smell, SOB, CP, abdominal pain, n/v/d.  Patient is currently home Denies any high risk travel to areas of current concern for COVID19. Denies any known or suspected exposure to person with or possibly with COVID19.  Past Medical History:  Diagnosis Date  . Anxiety   . Fatigue   . Hyperlipemia   . Irregular menstrual cycle   . Obesity    Social History   Tobacco Use  . Smoking status: Current Every Day  Smoker    Packs/day: 1.50    Years: 25.00    Pack years: 37.50    Types: Cigarettes    Start date: 03/23/1993  . Smokeless tobacco: Never Used  Vaping Use  . Vaping Use: Never used  Substance Use Topics  . Alcohol use: No    Alcohol/week: 0.0 standard drinks  . Drug use: No    Current Outpatient Medications:  .  atorvastatin (LIPITOR) 20 MG tablet, Take 1 tablet (20 mg total) by mouth daily., Disp: 90 tablet, Rfl: 1 .  cyclobenzaprine (FLEXERIL) 10 MG tablet, Take 1 tablet (10 mg total) by mouth 3 (three) times daily as needed., Disp: 15 tablet, Rfl: 0 .  FLUoxetine (PROZAC) 10 MG capsule, Take 2 capsules (20 mg total) by mouth daily., Disp: 180 capsule, Rfl: 1 .  guaiFENesin (MUCINEX) 600 MG 12 hr tablet, Take by mouth 2 (two) times daily., Disp: , Rfl:  .  hydrOXYzine (ATARAX/VISTARIL) 10 MG tablet, Take 1 tablet (10 mg total) by mouth at bedtime., Disp: 30 tablet, Rfl: 0 .  lisinopril (ZESTRIL) 20 MG tablet, Take 1 tablet (20 mg total) by mouth daily., Disp: 90 tablet, Rfl: 1 .  oxyCODONE-acetaminophen (PERCOCET) 7.5-325 MG tablet, Take 1 tablet by mouth every 6 (six) hours as needed for severe pain., Disp: 12 tablet, Rfl: 0 .  promethazine-dextromethorphan (PROMETHAZINE-DM) 6.25-15 MG/5ML syrup, Take 5 mLs by mouth every 6 (six) hours as needed for cough., Disp: 118 mL, Rfl: 0  Depression screen [facility] 2/9 07/07/2018 04/06/2018 03/23/2018  Decreased Interest 0 0 0  Down, Depressed, Hopeless 0 0 0  PHQ - 2 Score 0 0 0  Altered sleeping 0 - 2  Tired, decreased energy 1 - 3  Change in appetite 0 - 0  Feeling bad or failure about yourself  0 - 0  Trouble concentrating 0 - 0  Moving slowly or fidgety/restless 0 - 0  Suicidal thoughts 0 - 0  PHQ-9 Score 1 - 5  Difficult doing work/chores Not difficult at all - -    GAD 7 : Generalized Anxiety Score 07/07/2018  Nervous, Anxious, on Edge 0  Control/stop worrying 0  Worry too much - different things 0  Trouble relaxing 0  Restless 0   Easily annoyed or irritable 0  Afraid - awful might happen 0  Total GAD 7 Score 0  Anxiety Difficulty Not difficult at all    -------------------------------------------------------------------------- O: No physical exam performed due to remote telephone encounter.  Physical Exam: Patient remotely monitored without video.  Verbal communication appropriate.  Cognition normal.  No results found for this or any previous visit (from the past 2160 hour(s)).  -------------------------------------------------------------------------- A&P:  Problem List Items Addressed This Visit      Other   Cough - Primary    Likely PND causing cough and sore throat.  Has COVID testing today at 3pm for evaluation that takes 2 days for results.  Discussed symptomatic treatment with OTC coriciden HBP due to history of HTN, along with Afrin nasal spray to work as a decongestant and promethazine DM to take as needed for cough.  Plan: 1. Take medications as directed 2. [facility] and notify us once receive results 3. Schedule follow up visit as needed      Relevant Medications   promethazine-dextromethorphan (PROMETHAZINE-DM) 6.25-15 MG/5ML syrup   Nasal congestion   Relevant Medications   promethazine-dextromethorphan (PROMETHAZINE-DM) 6.25-15 MG/5ML syrup   Runny nose   Sore throat      Meds ordered this encounter  Medications  . promethazine-dextromethorphan (PROMETHAZINE-DM) 6.25-15 MG/5ML syrup    Sig: Take 5 mLs by mouth every 6 (six) hours as needed for cough.    Dispense:  118 mL    Refill:  0    Follow-up: - Return as needed if symptoms worsen or fail to improve  Patient verbalizes understanding with the above medical recommendations including the limitation of remote medical advice.  Specific follow-up and call-back criteria were given for patient to follow-up or seek medical care more urgently if needed.  - Time spent in direct consultation with  patient on phone: 6 minutes  Charlaine Dalton, FNP-C [facility] 10/07/2019, 10:14 AM

## 2019-10-11 ENCOUNTER — Telehealth: Payer: Self-pay | Admitting: Family Medicine

## 2019-10-11 ENCOUNTER — Other Ambulatory Visit: Payer: Self-pay | Admitting: Family Medicine

## 2019-10-11 DIAGNOSIS — J019 Acute sinusitis, unspecified: Secondary | ICD-10-CM

## 2019-10-11 MED ORDER — AMOXICILLIN-POT CLAVULANATE 875-125 MG PO TABS: 1.0000 | ORAL_TABLET | Freq: Two times a day (BID) | ORAL | 0 refills | Status: DC

## 2019-10-11 NOTE — Progress Notes (Signed)
See telephone encounter from today.

## 2019-10-11 NOTE — Telephone Encounter (Signed)
Had spoken with patient on virtual visit late last week, with viral like symptoms, and was having COVID testing.  Treated symptoms but currently having worsening of symptoms, likely sinusitis.  Will send in Rx for Augmentin 875-125mg  to take 1 tablet 2x per day for the next 10 days.  Will message on MyChart as well.

## 2019-10-11 NOTE — Telephone Encounter (Signed)
Pt had covid test last Thurs and it was negative.  Pt states [name] wanted to wait until covid test came back to prescribe anything.  Pt states it has now moved into her ears.  Ears feel stopped up and hurt.  Pt still blowing out green.  It is all in her head. No fever or sore throat.  Would like Rx called into   [facility] - [place], [place] - [address] Phone:  [phone]  Fax:  [phone]

## 2019-11-08 ENCOUNTER — Other Ambulatory Visit: Payer: Self-pay

## 2019-11-08 ENCOUNTER — Ambulatory Visit (INDEPENDENT_AMBULATORY_CARE_PROVIDER_SITE_OTHER): Payer: Managed Care, Other (non HMO) | Admitting: Family Medicine

## 2019-11-08 ENCOUNTER — Encounter: Payer: Self-pay | Admitting: Family Medicine

## 2019-11-08 VITALS — BP 167/71 | HR 81 | Temp 97.8°F | Ht 62.0 in | Wt 187.6 lb

## 2019-11-08 DIAGNOSIS — F5104 Psychophysiologic insomnia: Secondary | ICD-10-CM

## 2019-11-08 DIAGNOSIS — G4733 Obstructive sleep apnea (adult) (pediatric): Secondary | ICD-10-CM

## 2019-11-08 DIAGNOSIS — Z1211 Encounter for screening for malignant neoplasm of colon: Secondary | ICD-10-CM | POA: Diagnosis not present

## 2019-11-08 DIAGNOSIS — S76319A Strain of muscle, fascia and tendon of the posterior muscle group at thigh level, unspecified thigh, initial encounter: Secondary | ICD-10-CM | POA: Diagnosis not present

## 2019-11-08 DIAGNOSIS — Z1231 Encounter for screening mammogram for malignant neoplasm of breast: Secondary | ICD-10-CM | POA: Insufficient documentation

## 2019-11-08 DIAGNOSIS — I10 Essential (primary) hypertension: Secondary | ICD-10-CM

## 2019-11-08 DIAGNOSIS — Z9989 Dependence on other enabling machines and devices: Secondary | ICD-10-CM

## 2019-11-08 MED ORDER — CYCLOBENZAPRINE HCL 10 MG PO TABS: 5.0000 mg | ORAL_TABLET | Freq: Three times a day (TID) | ORAL | 1 refills | Status: AC | PRN

## 2019-11-08 MED ORDER — ZOLPIDEM TARTRATE 10 MG PO TABS: 10.0000 mg | ORAL_TABLET | Freq: Every evening | ORAL | 1 refills | Status: DC | PRN

## 2019-11-08 NOTE — Assessment & Plan Note (Signed)
Pt last mammogram 07/26/2015.  Result BI-RADS 1, negative.  Plan: 1. Screening mammogram order placed.  Pt will call to schedule appointment.  Information given.

## 2019-11-08 NOTE — Progress Notes (Signed)
Subjective:    Patient ID: [name], female    DOB: 08-04-1965, 54 y.o.   MRN: [id number]  [name] is a 54 y.o. female presenting on 11/08/2019 for Leg Pain (intermittent bilateral leg pain that occurs mostly when she apply pressure (walking ). Pt state the pain starts in the back of the thighs and radaites down the calves.)   HPI  [name] reports that she has been having bilateral leg pain that starts in her hamstrings and goes down to her calves intermittently.  Has increased pain with squatting or trying to bend to pick something up off the ground.  Denies any low back pain, numbness, tingling, weakness, saddle anesthesia, change in bowel/bladder function.    Has concerns for continued insomnia, had taken the hydroxyzine without improvement has tried and failed trazodone in the past.  Does have a history of OSA and reports has not been wearing her facemask nightly.  Hypertension - She is checking BP at home or outside of clinic.  Readings <140/90, reports have been higher due to high stress at work - Current medications: lisinopril 20mg  daily, tolerating well without side effects - She is not currently symptomatic. - Pt denies headache, lightheadedness, dizziness, changes in vision, chest tightness/pressure, palpitations, leg swelling, sudden loss of speech or loss of consciousness. - She  reports no regular exercise routine. - Her diet is high in salt, high in fat, and high in carbohydrates.  Depression screen [facility] 2/9 07/07/2018 04/06/2018 03/23/2018  Decreased Interest 0 0 0  Down, Depressed, Hopeless 0 0 0  PHQ - 2 Score 0 0 0  Altered sleeping 0 - 2  Tired, decreased energy 1 - 3  Change in appetite 0 - 0  Feeling bad or failure about yourself  0 - 0  Trouble concentrating 0 - 0  Moving slowly or fidgety/restless 0 - 0  Suicidal thoughts 0 - 0  PHQ-9 Score 1 - 5  Difficult doing work/chores Not difficult at all - -    Social History   Tobacco Use  . Smoking  status: Current Every Day Smoker    Packs/day: 1.50    Years: 25.00    Pack years: 37.50    Types: Cigarettes    Start date: 03/23/1993  . Smokeless tobacco: Never Used  Vaping Use  . Vaping Use: Never used  Substance Use Topics  . Alcohol use: No    Alcohol/week: 0.0 standard drinks  . Drug use: No    Review of Systems  Constitutional: Negative.   HENT: Negative.   Eyes: Negative.   Respiratory: Negative.   Cardiovascular: Negative.   Gastrointestinal: Negative.   Endocrine: Negative.   Genitourinary: Negative.   Musculoskeletal: Positive for myalgias. Negative for arthralgias, back pain, gait problem, joint swelling, neck pain and neck stiffness.  Skin: Negative.   Allergic/Immunologic: Negative.   Neurological: Negative.   Hematological: Negative.   Psychiatric/Behavioral: Negative.    Per HPI unless specifically indicated above     Objective:    BP (!) 167/71 (BP Location: Right Arm, Patient Position: Sitting, Cuff Size: Normal)   Pulse 81   Temp 97.8 F (36.6 C) (Oral)   Ht 5\' 2"  (1.575 m)   Wt 187 lb 9.6 oz (85.1 kg)   BMI 34.31 kg/m   Wt Readings from Last 3 Encounters:  11/08/19 187 lb 9.6 oz (85.1 kg)  07/26/19 178 lb (80.7 kg)  06/11/19 193 lb 6.4 oz (87.7 kg)    Physical  Exam Vitals reviewed.  Constitutional:      General: She is not in acute distress.    Appearance: Normal appearance. She is well-developed and well-groomed. She is obese. She is not ill-appearing or toxic-appearing.  HENT:     Head: Normocephalic and atraumatic.     Nose:     Comments: Lesia Sago is in place, covering mouth and nose. Eyes:     General: Lids are normal. Vision grossly intact.        Right eye: No discharge.        Left eye: No discharge.     Extraocular Movements: Extraocular movements intact.     Conjunctiva/sclera: Conjunctivae normal.     Pupils: Pupils are equal, round, and reactive to light.  Cardiovascular:     Rate and Rhythm: Normal rate and regular  rhythm.     Pulses: Normal pulses.          Dorsalis pedis pulses are 2+ on the right side and 2+ on the left side.     Heart sounds: Normal heart sounds. No murmur heard.  No friction rub. No gallop.   Pulmonary:     Effort: Pulmonary effort is normal. No respiratory distress.     Breath sounds: Normal breath sounds.  Musculoskeletal:     Right upper leg: Tenderness present.     Left upper leg: Tenderness present.     Right lower leg: No edema.     Left lower leg: No edema.     Comments: Tenderness to palpation and with certain movements hamstrings and calves  Skin:    General: Skin is warm and dry.     Capillary Refill: Capillary refill takes less than 2 seconds.  Neurological:     General: No focal deficit present.     Mental Status: She is alert and oriented to person, place, and time.  Psychiatric:        Attention and Perception: Attention and perception normal.        Mood and Affect: Mood and affect normal.        Speech: Speech normal.        Behavior: Behavior normal. Behavior is cooperative.        Thought Content: Thought content normal.        Cognition and Memory: Cognition and memory normal.        Judgment: Judgment normal.    Results for orders placed or performed in visit on 06/11/19  Cytology - PAP  Result Value Ref Range   Adequacy      Satisfactory for evaluation; transformation zone component PRESENT.   Diagnosis      - Negative for intraepithelial lesion or malignancy (NILM)   Microorganisms Shift in flora suggestive of bacterial vaginosis       Assessment & Plan:   Problem List Items Addressed This Visit      Cardiovascular and Mediastinum   Essential hypertension    Uncontrolled hypertension.  BP is not at goal < 130/80.  Pt is not working on lifestyle modifications.  Taking medications tolerating well without side effects.  Need to have record of home BP to determine if we need to increase to Lisinopril-HCTZ or add a different  medication Complications: Obesity, OSA not adequately using CPAP, HLD  Plan: 1. Continue taking lisinopril 20mg  daily 2. Obtain labs at next OV  3. Encouraged heart healthy diet and increasing exercise to 30 minutes most days of the week, going no more than 2 days in a  row without exercise. 4. Check BP 1-2 x per day at home, keep log, and bring to clinic at next appointment. 5. Follow up 4 weeks.           Respiratory   OSA on CPAP    Hx of OSA and on CPAP, not using nightly.  Reports a few times per week will use, but not seeing a difference in her sleep/fatigue when wearing. Encouraged to wear nightly and to obtain copy of compliance report from machine.        Musculoskeletal and Integument   Hamstring muscle strain - Primary    Bilateral hamstring muscle strain.  Discussed muscle relaxers in combination with ibuprofen and/or acetaminophen, as well as stretches.  Encouraged PT referral, patient declines at this time.  Discussed can find PT providers on YouTube such as "Dr. Alvino Chapel" or "Nadine Counts & Nida Boatman" that can provide guidance on stretches for the hamstrings.  Plan: 1. Begin cyclobenzaprine 5-10mg  TID PRN for muscle strain 2. Begin ibuprofen and/or acetaminophen, according to packaging directions 3. Begin home stretches/exercises to help loosen the muscle and alleviate strain, if no relief, can refer to physical therapy 4. RTC in 4 weeks, sooner if needed      Relevant Medications   cyclobenzaprine (FLEXERIL) 10 MG tablet     Other   Encounter for screening mammogram for malignant neoplasm of breast    Pt last mammogram 07/26/2015.  Result BI-RADS 1, negative.  Plan: 1. Screening mammogram order placed.  Pt will call to schedule appointment.  Information given.       Relevant Orders   MM 3D SCREEN BREAST BILATERAL   Screening for colon cancer    Pt requiring colon cancer screening.  Denies family history of colon cancer.  Plan: - Discussed timing for initiation of colon  cancer screening ACS vs USPSTF guidelines - Mutual decision making discussion for options of colonoscopy vs cologuard.  Pt prefers cologuard. - Ordered Cologuard today      Relevant Orders   Cologuard   Psychophysiological insomnia    Has OSA and is intermittently compliant by report with CPAP machine.  Is up multiple times per night, has previously tried and failed hydroxyzine and trazodone.  Discussed use of ambien in addition to working on a sleep hygiene routine and using her CPAP.  Sleep hygiene handout provided  Plan: 1. Begin ambien 5-10mg  1 hour before bed for insomnia 2. Review sleep hygiene handout 3. To use CPAP machine nightly and obtain copy of compliance report for follow up visit 4. RTC in 4 weeks for re-evaluation      Relevant Medications   zolpidem (AMBIEN) 10 MG tablet      Meds ordered this encounter  Medications  . cyclobenzaprine (FLEXERIL) 10 MG tablet    Sig: Take 0.5-1 tablets (5-10 mg total) by mouth 3 (three) times daily as needed.    Dispense:  90 tablet    Refill:  1  . zolpidem (AMBIEN) 10 MG tablet    Sig: Take 1 tablet (10 mg total) by mouth at bedtime as needed for sleep.    Dispense:  30 tablet    Refill:  1    Follow up plan: Return in about 4 weeks (around 12/06/2019) for Insomnia & HTN F/U (Can be virtual).   Charlaine Dalton, FNP Family Nurse Practitioner [facility] 11/08/2019, 2:41 PM

## 2019-11-08 NOTE — Assessment & Plan Note (Signed)
Uncontrolled hypertension.  BP is not at goal < 130/80.  Pt is not working on lifestyle modifications.  Taking medications tolerating well without side effects.  Need to have record of home BP to determine if we need to increase to Lisinopril-HCTZ or add a different medication Complications: Obesity, OSA not adequately using CPAP, HLD  Plan: 1. Continue taking lisinopril 20mg  daily 2. Obtain labs at next OV  3. Encouraged heart healthy diet and increasing exercise to 30 minutes most days of the week, going no more than 2 days in a row without exercise. 4. Check BP 1-2 x per day at home, keep log, and bring to clinic at next appointment. 5. Follow up 4 weeks.

## 2019-11-08 NOTE — Assessment & Plan Note (Signed)
Bilateral hamstring muscle strain.  Discussed muscle relaxers in combination with ibuprofen and/or acetaminophen, as well as stretches.  Encouraged PT referral, patient declines at this time.  Discussed can find PT providers on YouTube such as "Dr. Alvino Chapel" or "Nadine Counts & Nida Boatman" that can provide guidance on stretches for the hamstrings.  Plan: 1. Begin cyclobenzaprine 5-10mg  TID PRN for muscle strain 2. Begin ibuprofen and/or acetaminophen, according to packaging directions 3. Begin home stretches/exercises to help loosen the muscle and alleviate strain, if no relief, can refer to physical therapy 4. RTC in 4 weeks, sooner if needed

## 2019-11-08 NOTE — Assessment & Plan Note (Signed)
Has OSA and is intermittently compliant by report with CPAP machine.  Is up multiple times per night, has previously tried and failed hydroxyzine and trazodone.  Discussed use of ambien in addition to working on a sleep hygiene routine and using her CPAP.  Sleep hygiene handout provided  Plan: 1. Begin ambien 5-10mg  1 hour before bed for insomnia 2. Review sleep hygiene handout 3. To use CPAP machine nightly and obtain copy of compliance report for follow up visit 4. RTC in 4 weeks for re-evaluation

## 2019-11-08 NOTE — Assessment & Plan Note (Signed)
Pt requiring colon cancer screening.  Denies family history of colon cancer.  Plan: - Discussed timing for initiation of colon cancer screening ACS vs USPSTF guidelines - Mutual decision making discussion for options of colonoscopy vs cologuard.  Pt prefers cologuard. - Ordered Cologuard today 

## 2019-11-08 NOTE — Patient Instructions (Addendum)
As we discussed, you have a hamstring strain.  It is important that we work to stretch these muscles.  On YouTube there are physical therapists that will have videos that can help you stretch these.  "Dr. Denice Paradise" and "Mikki Santee & Leroy Sea" have videos that can help with stretches.  If you change your mind and decide you'd like to have physical therapy, please let us know and I can send in a referral.  I have sent in a prescription for cyclobenzaprine to take 1/2 to 1 tablet up to 3x per day as needed for muscle cramp/spasm.  For Mammogram screening for breast cancer   Call the Corn below anytime to schedule your own appointment now that order has been placed.  [facility] [place], [place] [address] Phone: [phone]  [facility] [address] [place], [place] [address] Phone: [phone]  I have sent in a prescription for ambien $RemoveBe'10mg'[id number]$  to take 1 hour before bed to help with insomnia.  Sleep hygiene is the single most effective treatment for sleep issues, but it is hard work.  Tips for a good night's sleep:  -Keep sleep environment comfortable and conducive to sleep -Keep regular sleep schedule 7 nights a week -Avoiding naps during the day -Avoiding going to bed until drowsy and ready to sleep, not trying to sleep, and not watching the clock -Get out of bed if not asleep within 15-20 minutes and returning only when drowsy -Avoiding caffeine, nicotine, alcohol, and other substances that interfere with sleep before bedtime -Take an hour before your set bedtime and start to wind down: bath/shower, no more TV or phone (the blue light can interfere with sleeping), listen to soothing music, or meditation -No TV in your bedroom -Exercising regularly, at least 6 hours before sleep. Yoga and Tai Chi can improve sleep quality  There are a lot of books and apps that may help guide you with any of the following:    -Progressive muscle relaxation (involves methodical tension and relaxation of different Muscle groups throughout body)  Guided imagery  -YouTube - Gwynne Edinger has free videos on YouTube that can help with meditation and some   Abdominal breathing   Over the counter sleep aid one hour before bed- and gradually wean your use over 2-4 weeks  Some examples are : *Melatonin 5-10 mg *Sleepology (Can find on [employer]) taken according to packaging directions  There are a few online evidence based online programs, unfortunately they are not free.   Developed by a sleep expert who created a drug-free program for insomnia proven more effective than sleeping pills.  www.cbtforinsomnia.com Sleepio is an evidence-based digital sleep improvement program   www.sleepio.com SHUTi is designed to actively help retrain your body and mind for great sleep through six engaging Cognitive Behavioral Therapy for Insomnia strategy and learning sessions  BloggerCourse.com  Try to get exercise a minimum of 30 minutes per day at least 5 days per week as well as  adequate water intake all while measuring blood pressure a few times per week.  Keep a blood pressure log and bring back to clinic at your next visit.  If your readings are consistently over 130/80 to contact our office/send me a MyChart message and we will see you sooner.  Can try DASH and Mediterranean diet options, avoiding processed foods, lowering sodium intake, avoiding pork products, and eating a plant based diet for optimal health.  We will plan to see  you back in 4 weeks for insomnia and hypertension follow up visit  You will receive a survey after today's visit either digitally by e-mail or paper by [employer]. Your experiences and feedback matter to [place].  Please respond so we know how we are doing as we provide care for you.  Call us with any questions/concerns/needs.  It is my goal to be available to you for your health  concerns.  Thanks for choosing me to be a partner in your healthcare needs!  Harlin Rain, FNP-C Family Nurse Practitioner [facility] Phone: [phone]

## 2019-11-08 NOTE — Assessment & Plan Note (Signed)
Hx of OSA and on CPAP, not using nightly.  Reports a few times per week will use, but not seeing a difference in her sleep/fatigue when wearing. Encouraged to wear nightly and to obtain copy of compliance report from machine.

## 2020-01-04 ENCOUNTER — Encounter: Payer: Self-pay | Admitting: Family Medicine

## 2020-01-24 ENCOUNTER — Ambulatory Visit: Payer: Managed Care, Other (non HMO) | Admitting: Dermatology

## 2020-02-14 ENCOUNTER — Other Ambulatory Visit: Payer: Self-pay

## 2020-02-14 ENCOUNTER — Ambulatory Visit (INDEPENDENT_AMBULATORY_CARE_PROVIDER_SITE_OTHER): Payer: Managed Care, Other (non HMO) | Admitting: Family Medicine

## 2020-02-14 ENCOUNTER — Ambulatory Visit: Payer: Self-pay

## 2020-02-14 ENCOUNTER — Encounter: Payer: Self-pay | Admitting: Family Medicine

## 2020-02-14 VITALS — BP 195/60 | HR 76 | Temp 97.7°F | Resp 17 | Ht 62.0 in

## 2020-02-14 DIAGNOSIS — R110 Nausea: Secondary | ICD-10-CM

## 2020-02-14 DIAGNOSIS — R42 Dizziness and giddiness: Secondary | ICD-10-CM | POA: Diagnosis not present

## 2020-02-14 DIAGNOSIS — F419 Anxiety disorder, unspecified: Secondary | ICD-10-CM

## 2020-02-14 DIAGNOSIS — E782 Mixed hyperlipidemia: Secondary | ICD-10-CM

## 2020-02-14 MED ORDER — FLUOXETINE HCL 10 MG PO CAPS: 20.0000 mg | ORAL_CAPSULE | Freq: Every day | ORAL | 1 refills | Status: DC

## 2020-02-14 MED ORDER — MECLIZINE HCL 25 MG PO TABS: 25.0000 mg | ORAL_TABLET | Freq: Three times a day (TID) | ORAL | 0 refills | Status: DC | PRN

## 2020-02-14 MED ORDER — ATORVASTATIN CALCIUM 20 MG PO TABS: 20.0000 mg | ORAL_TABLET | Freq: Every day | ORAL | 1 refills | Status: DC

## 2020-02-14 MED ORDER — PROMETHAZINE HCL 25 MG/ML IJ SOLN
25.0000 mg | Freq: Once | INTRAMUSCULAR | Status: AC
  Administered 2020-02-14: 25 mg via INTRAMUSCULAR

## 2020-02-14 MED ORDER — ONDANSETRON 4 MG PO TBDP: 4.0000 mg | ORAL_TABLET | Freq: Three times a day (TID) | ORAL | 0 refills | Status: DC | PRN

## 2020-02-14 MED ORDER — LISINOPRIL 20 MG PO TABS: 20.0000 mg | ORAL_TABLET | Freq: Every day | ORAL | 1 refills | Status: DC

## 2020-02-14 NOTE — Patient Instructions (Addendum)
We have given you an injection of promethazine 25mg  today for your nausea.  Be sure not to drive or operate heavy machinery after you leave clinic today.  I have sent in a prescription for meclizine 25mg  to take 1 tablet up to 3x per day as needed for vertigo  I have sent in a prescription for ondansetron 4mg  ODT to take 1 tablet and allow to dissolve on your tongue, every 8 hours as needed for nausea.  We will plan to see you back if your symptoms worsen or fail to improve  You will receive a survey after today's visit either digitally by e-mail or paper by USPS mail. Your experiences and feedback matter to .  Please respond so we know how we are doing as we provide care for you.  Call with any questions/concerns/needs.  It is my goal to be available to you for your health concerns.  Thanks for choosing me to be a partner in your healthcare needs!  , FNP-C Family Nurse Practitioner [facility] Phone: [phone]    Benign Positional Vertigo Vertigo is the feeling that you or your surroundings are moving when they are not. Benign positional vertigo is the most common form of vertigo. This is usually a harmless condition (benign). This condition is positional. This means that symptoms are triggered by certain movements and positions. This condition can be dangerous if it occurs while you are doing something that could cause harm to you or others. This includes activities such as driving or operating machinery. What are the causes? In many cases, the cause of this condition is not known. It may be caused by a disturbance in an area of the inner ear that helps your brain to sense movement and balance. This disturbance can be caused by:  Viral infection (labyrinthitis).  Head injury.  Repetitive motion, such as jumping, dancing, or running. What increases the risk? You are more likely to develop this condition if:  You  are a woman.  You are 74 years of age or older. What are the signs or symptoms? Symptoms of this condition usually happen when you move your head or your eyes in different directions. Symptoms may start suddenly, and usually last for less than a minute. They include:  Loss of balance and falling.  Feeling like you are spinning or moving.  Feeling like your surroundings are spinning or moving.  Nausea and vomiting.  Blurred vision.  Dizziness.  Involuntary eye movement (nystagmus). Symptoms can be mild and cause only minor problems, or they can be severe and interfere with daily life. Episodes of benign positional vertigo may return (recur) over time. Symptoms may improve over time. How is this diagnosed? This condition may be diagnosed based on:  Your medical history.  Physical exam of the head, neck, and ears.  Tests, such as: ? MRI. ? CT scan. ? Eye movement tests. Your health care provider may ask you to change positions quickly while he or she watches you for symptoms of benign positional vertigo, such as nystagmus. Eye movement may be tested with a variety of exams that are designed to evaluate or stimulate vertigo. ? An electroencephalogram (EEG). This records electrical activity in your brain. ? Hearing tests. You may be referred to a health care provider who specializes in ear, nose, and throat (ENT) problems (otolaryngologist) or a provider who specializes in disorders of the nervous system (neurologist). How is this treated?  This  condition may be treated in a session in which your health care provider moves your head in specific positions to adjust your inner ear back to normal. Treatment for this condition may take several sessions. Surgery may be needed in severe cases, but this is rare. In some cases, benign positional vertigo may resolve on its own in 2-4 weeks. Follow these instructions at home: Safety  Move slowly. Avoid sudden body or head movements or  certain positions, as told by your health care provider.  Avoid driving until your health care provider says it is safe for you to do so.  Avoid operating heavy machinery until your health care provider says it is safe for you to do so.  Avoid doing any tasks that would be dangerous to you or others if vertigo occurs.  If you have trouble walking or keeping your balance, try using a cane for stability. If you feel dizzy or unstable, sit down right away.  Return to your normal activities as told by your health care provider. Ask your health care provider what activities are safe for you. General instructions  Take over-the-counter and prescription medicines only as told by your health care provider.  Drink enough fluid to keep your urine pale yellow.  Keep all follow-up visits as told by your health care provider. This is important. Contact a health care provider if:  You have a fever.  Your condition gets worse or you develop new symptoms.  Your family or friends notice any behavioral changes.  You have nausea or vomiting that gets worse.  You have numbness or a "pins and needles" sensation. Get help right away if you:  Have difficulty speaking or moving.  Are always dizzy.  Faint.  Develop severe headaches.  Have weakness in your legs or arms.  Have changes in your hearing or vision.  Develop a stiff neck.  Develop sensitivity to light. Summary  Vertigo is the feeling that you or your surroundings are moving when they are not. Benign positional vertigo is the most common form of vertigo.  The cause of this condition is not known. It may be caused by a disturbance in an area of the inner ear that helps your brain to sense movement and balance.  Symptoms include loss of balance and falling, feeling that you or your surroundings are moving, nausea and vomiting, and blurred vision.  This condition can be diagnosed based on symptoms, physical exam, and other tests,  such as MRI, CT scan, eye movement tests, and hearing tests.  Follow safety instructions as told by your health care provider. You will also be told when to contact your health care provider in case of problems. This information is not intended to replace advice given to you by your health care provider. Make sure you discuss any questions you have with your health care provider. Document Revised: 07/23/2017 Document Reviewed: 07/23/2017 Elsevier Patient Education  2020 [employer]. How to Perform the Epley Maneuver The Epley maneuver is an exercise that relieves symptoms of vertigo. Vertigo is the feeling that you or your surroundings are moving when they are not. When you feel vertigo, you may feel like the room is spinning and have trouble walking. Dizziness is a little different than vertigo. When you are dizzy, you may feel unsteady or light-headed. You can do this maneuver at home whenever you have symptoms of vertigo. You can do it up to 3 times a day until your symptoms go away. Even though the Epley maneuver  may relieve your vertigo for a few weeks, it is possible that your symptoms will return. This maneuver relieves vertigo, but it does not relieve dizziness. What are the risks? If it is done correctly, the Epley maneuver is considered safe. Sometimes it can lead to dizziness or nausea that goes away after a short time. If you develop other symptoms, such as changes in vision, weakness, or numbness, stop doing the maneuver and call your health care provider. How to perform the Epley maneuver 1. Sit on the edge of a bed or table with your back straight and your legs extended or hanging over the edge of the bed or table. 2. Turn your head halfway toward the affected ear or side. 3. Lie backward quickly with your head turned until you are lying flat on your back. You may want to position a pillow under your shoulders. 4. Hold this position for 30 seconds. You may experience an attack of  vertigo. This is normal. 5. Turn your head to the opposite direction until your unaffected ear is facing the floor. 6. Hold this position for 30 seconds. You may experience an attack of vertigo. This is normal. Hold this position until the vertigo stops. 7. Turn your whole body to the same side as your head. Hold for another 30 seconds. 8. Sit back up. You can repeat this exercise up to 3 times a day. Follow these instructions at home:  After doing the Epley maneuver, you can return to your normal activities.  Ask your health care provider if there is anything you should do at home to prevent vertigo. He or she may recommend that you: ? Keep your head raised (elevated) with two or more pillows while you sleep. ? Do not sleep on the side of your affected ear. ? Get up slowly from bed. ? Avoid sudden movements during the day. ? Avoid extreme head movement, like looking up or bending over. Contact a health care provider if:  Your vertigo gets worse.  You have other symptoms, including: ? Nausea. ? Vomiting. ? Headache. Get help right away if:  You have vision changes.  You have a severe or worsening headache or neck pain.  You cannot stop vomiting.  You have new numbness or weakness in any part of your body. Summary  Vertigo is the feeling that you or your surroundings are moving when they are not.  The Epley maneuver is an exercise that relieves symptoms of vertigo.  If the Epley maneuver is done correctly, it is considered safe. You can do it up to 3 times a day. This information is not intended to replace advice given to you by your health care provider. Make sure you discuss any questions you have with your health care provider. Document Revised: 01/24/2017 Document Reviewed: 01/02/2016 Elsevier Patient Education  2020 [employer].

## 2020-02-14 NOTE — Assessment & Plan Note (Signed)
Currently stable and well controlled with fluoxetine 20mg  daily.  Refills sent to pharmacy on file.  Will continue.

## 2020-02-14 NOTE — Telephone Encounter (Signed)
Pt. Woke up this morning dizzy, felt " like the room was spinning." States had this about 5 years ago. Has headache and nausea with this. Appointment made.  Reason for Disposition . [1] MODERATE dizziness (e.g., vertigo; feels very unsteady, interferes with normal activities) AND [2] has been evaluated by physician for this  Answer Assessment - Initial Assessment Questions 1. DESCRIPTION: "Describe your dizziness."     Dizzy 2. VERTIGO: "Do you feel like either you or the room is spinning or tilting?"      Yes 3. LIGHTHEADED: "Do you feel lightheaded?" (e.g., somewhat faint, woozy, weak upon standing)     Yes 4. SEVERITY: "How bad is it?"  "Can you walk?"   - MILD: Feels unsteady but walking normally.   - MODERATE: Feels very unsteady when walking, but not falling; interferes with normal activities (e.g., school, work) .   - SEVERE: Unable to walk without falling, or requires assistance to walk without falling.     Moderate 5. ONSET:  "When did the dizziness begin?"     This morning 6. AGGRAVATING FACTORS: "Does anything make it worse?" (e.g., standing, change in head position)     Movement 7. CAUSE: "What do you think is causing the dizziness?"     Vertigo 8. RECURRENT SYMPTOM: "Have you had dizziness before?" If Yes, ask: "When was the last time?" "What happened that time?"     Yes 9. OTHER SYMPTOMS: "Do you have any other symptoms?" (e.g., headache, weakness, numbness, vomiting, earache)     Headache, nausea 10. PREGNANCY: "Is there any chance you are pregnant?" "When was your last menstrual period?"       No  Protocols used: DIZZINESS - VERTIGO-A-AH

## 2020-02-14 NOTE — Assessment & Plan Note (Signed)
Refills sent to pharmacy on file for atorvastatin 20mg  daily

## 2020-02-14 NOTE — Progress Notes (Signed)
Subjective:    Patient ID: [name], female    DOB: 05-23-1965, 54 y.o.   MRN: [id number]  [name] is a 54 y.o. female presenting on 02/14/2020 for Dizziness (Pt state she feel like the room is spinning, headache, nausea and lightheadedness x 8 hrs. With history of vertigo )   HPI  [name] presents to clinic with concerns of dizziness, feeling of room spinning sensation, headache, nausea and lightheadedness since 530am.  Has a history of vertigo in the past.  Denies any recent illness, nasal congestion, ear pain or air travel.  Denies any ear popping, clicking, fevers, or room spinning sensation lasting longer than a couple of seconds.  Reports symptoms are present when she looks down or sideways.    Depression screen [facility] 2/9 07/07/2018 04/06/2018 03/23/2018  Decreased Interest 0 0 0  Down, Depressed, Hopeless 0 0 0  PHQ - 2 Score 0 0 0  Altered sleeping 0 - 2  Tired, decreased energy 1 - 3  Change in appetite 0 - 0  Feeling bad or failure about yourself  0 - 0  Trouble concentrating 0 - 0  Moving slowly or fidgety/restless 0 - 0  Suicidal thoughts 0 - 0  PHQ-9 Score 1 - 5  Difficult doing work/chores Not difficult at all - -    Social History   Tobacco Use  . Smoking status: Current Every Day Smoker    Packs/day: 1.50    Years: 25.00    Pack years: 37.50    Types: Cigarettes    Start date: 03/23/1993  . Smokeless tobacco: Never Used  Vaping Use  . Vaping Use: Never used  Substance Use Topics  . Alcohol use: No    Alcohol/week: 0.0 standard drinks  . Drug use: No    Review of Systems  Constitutional: Negative.   HENT: Negative.   Eyes: Negative.   Respiratory: Negative.   Cardiovascular: Negative.   Gastrointestinal: Positive for nausea. Negative for abdominal distention, abdominal pain, anal bleeding, blood in stool, constipation, diarrhea, rectal pain and vomiting.  Endocrine: Negative.   Genitourinary: Negative.   Musculoskeletal: Negative.   Skin:  Negative.   Allergic/Immunologic: Negative.   Neurological: Positive for dizziness. Negative for tremors, seizures, syncope, facial asymmetry, speech difficulty, weakness, light-headedness, numbness and headaches.  Hematological: Negative.   Psychiatric/Behavioral: Negative.    Per HPI unless specifically indicated above     Objective:    BP (!) 195/60 (BP Location: Right Arm, Patient Position: Sitting, Cuff Size: Normal)   Pulse 76   Temp 97.7 F (36.5 C) (Temporal)   Resp 17   Ht 5\' 2"  (1.575 m)   SpO2 100%   BMI 34.31 kg/m   Wt Readings from Last 3 Encounters:  11/08/19 187 lb 9.6 oz (85.1 kg)  07/26/19 178 lb (80.7 kg)  06/11/19 193 lb 6.4 oz (87.7 kg)    Physical Exam Vitals and nursing note reviewed.  Constitutional:      General: She is not in acute distress.    Appearance: Normal appearance. She is well-developed and well-groomed. She is not ill-appearing or toxic-appearing.  HENT:     Head: Normocephalic and atraumatic.     Nose:     Comments: 06/13/19 is in place, covering mouth and nose. Eyes:     General: Lids are normal. Vision grossly intact.        Right eye: No discharge.        Left eye: No discharge.  Extraocular Movements: Extraocular movements intact.     Right eye: Nystagmus present. Normal extraocular motion.     Left eye: Nystagmus present. Normal extraocular motion.     Conjunctiva/sclera: Conjunctivae normal.     Pupils: Pupils are equal, round, and reactive to light.     Comments: Bilateral horizontal nystagmus  Cardiovascular:     Rate and Rhythm: Normal rate and regular rhythm.     Pulses: Normal pulses.     Heart sounds: Normal heart sounds. No murmur heard. No friction rub. No gallop.   Pulmonary:     Effort: Pulmonary effort is normal. No respiratory distress.     Breath sounds: Normal breath sounds.  Skin:    General: Skin is warm and dry.     Capillary Refill: Capillary refill takes less than 2 seconds.  Neurological:      General: No focal deficit present.     Mental Status: She is alert and oriented to person, place, and time.  Psychiatric:        Attention and Perception: Attention and perception normal.        Mood and Affect: Mood and affect normal.        Speech: Speech normal.        Behavior: Behavior normal. Behavior is cooperative.        Thought Content: Thought content normal.        Cognition and Memory: Cognition and memory normal.        Judgment: Judgment normal.    Results for orders placed or performed in visit on 06/11/19  Cytology - PAP  Result Value Ref Range   Adequacy      Satisfactory for evaluation; transformation zone component PRESENT.   Diagnosis      - Negative for intraepithelial lesion or malignancy (NILM)   Microorganisms Shift in flora suggestive of bacterial vaginosis       Assessment & Plan:   Problem List Items Addressed This Visit      Other   Anxiety    Currently stable and well controlled with fluoxetine 20mg  daily.  Refills sent to pharmacy on file.  Will continue.      Relevant Medications   FLUoxetine (PROZAC) 10 MG capsule   Mixed hyperlipidemia    Refills sent to pharmacy on file for atorvastatin 20mg  daily      Relevant Medications   lisinopril (ZESTRIL) 20 MG tablet   atorvastatin (LIPITOR) 20 MG tablet   Vertigo - Primary    Vertigo x 1 day, with past history of vertigo.  Will treat with meclizine 25mg  TID PRN.  Provided with epley's maneuvers to perform at home.  To take ondansetron according to packaging directions, for vertigo induced nausea.  RTC PRN      Relevant Medications   meclizine (ANTIVERT) 25 MG tablet   Nausea    Current nausea with vertigo, treating with promethazine 25mg  IM in clinic today.  Rx for ondansetron 4mg  ODT sent to pharmacy on file to take as directed.      Relevant Medications   promethazine (PHENERGAN) injection 25 mg   ondansetron (ZOFRAN-ODT) 4 MG disintegrating tablet      Meds ordered this encounter   Medications  . lisinopril (ZESTRIL) 20 MG tablet    Sig: Take 1 tablet (20 mg total) by mouth daily.    Dispense:  90 tablet    Refill:  1  . FLUoxetine (PROZAC) 10 MG capsule    Sig: Take 2 capsules (20 mg  total) by mouth daily.    Dispense:  180 capsule    Refill:  1  . atorvastatin (LIPITOR) 20 MG tablet    Sig: Take 1 tablet (20 mg total) by mouth daily.    Dispense:  90 tablet    Refill:  1  . promethazine (PHENERGAN) injection 25 mg  . meclizine (ANTIVERT) 25 MG tablet    Sig: Take 1 tablet (25 mg total) by mouth 3 (three) times daily as needed for dizziness.    Dispense:  30 tablet    Refill:  0  . ondansetron (ZOFRAN-ODT) 4 MG disintegrating tablet    Sig: Take 1 tablet (4 mg total) by mouth every 8 (eight) hours as needed for nausea or vomiting.    Dispense:  20 tablet    Refill:  0   Follow up plan: Return if symptoms worsen or fail to improve.   Charlaine Dalton, FNP Family Nurse Practitioner [facility] 02/14/2020, 2:53 PM

## 2020-02-14 NOTE — Assessment & Plan Note (Signed)
Vertigo x 1 day, with past history of vertigo.  Will treat with meclizine 25mg  TID PRN.  Provided with epley's maneuvers to perform at home.  To take ondansetron according to packaging directions, for vertigo induced nausea.  RTC PRN

## 2020-02-14 NOTE — Assessment & Plan Note (Signed)
Current nausea with vertigo, treating with promethazine 25mg  IM in clinic today.  Rx for ondansetron 4mg  ODT sent to pharmacy on file to take as directed.

## 2020-03-01 ENCOUNTER — Encounter: Payer: Self-pay | Admitting: Family Medicine

## 2020-05-31 ENCOUNTER — Ambulatory Visit: Payer: Managed Care, Other (non HMO) | Admitting: Physician Assistant

## 2020-05-31 ENCOUNTER — Other Ambulatory Visit: Payer: Self-pay

## 2020-05-31 VITALS — BP 170/84 | HR 92 | Temp 98.2°F | Resp 15 | Ht 61.0 in | Wt 184.0 lb

## 2020-05-31 DIAGNOSIS — L03011 Cellulitis of right finger: Secondary | ICD-10-CM

## 2020-05-31 DIAGNOSIS — F17200 Nicotine dependence, unspecified, uncomplicated: Secondary | ICD-10-CM

## 2020-05-31 DIAGNOSIS — R030 Elevated blood-pressure reading, without diagnosis of hypertension: Secondary | ICD-10-CM

## 2020-05-31 MED ORDER — MUPIROCIN 2 % EX OINT: 1.0000 "application " | TOPICAL_OINTMENT | Freq: Two times a day (BID) | CUTANEOUS | 0 refills | Status: AC

## 2020-05-31 NOTE — Patient Instructions (Signed)
Paronychia Paronychia is an infection of the skin. It happens near a fingernail or toenail. It may cause pain and swelling around the nail. In some cases, a fluid-filled bump (abscess) can form near or under the nail. Usually, this condition is not serious, and it clears up with treatment. Follow these instructions at home: Wound care  Keep the affected area clean.  Soak the fingers or toes in warm water as told by your doctor. You may be told to do this for 20 minutes, 2-3 times a day.  Keep the area dry when you are not soaking it.  Do not try to drain a fluid-filled bump on your own.  Follow instructions from your doctor about how to take care of the affected area. Make sure you: ? Wash your hands with soap and water before you change your bandage (dressing). If you cannot use soap and water, use hand sanitizer. ? Change your bandage as told by your doctor.  If you had a fluid-filled bump and your doctor drained it, check the area every day for signs of infection. Check for: ? Redness, swelling, or pain. ? Fluid or blood. ? Warmth. ? Pus or a bad smell. Medicines  Take over-the-counter and prescription medicines only as told by your doctor.  If you were prescribed an antibiotic medicine, take it as told by your doctor. Do not stop taking it even if you start to feel better.   General instructions  Avoid touching any chemicals.  Do not pick at the affected area. Prevention  To prevent this condition from happening again: ? Wear rubber gloves when putting your hands in water for washing dishes or other tasks. ? Wear gloves if your hands might touch cleaners or chemicals. ? Avoid injuring your nails or fingertips. ? Do not bite your nails or tear hangnails. ? Do not cut your nails very short. ? Do not cut the skin at the base and sides of the nail (cuticles). ? Use clean nail clippers or scissors when trimming nails. Contact a doctor if:  You feel worse.  You do not get  better.  You have more fluid, blood, or pus coming from the affected area.  Your finger or knuckle is swollen or is hard to move. Get help right away if you have:  A fever or chills.  Redness spreading from the affected area.  Pain in a joint or muscle. Summary  Paronychia is an infection of the skin. It happens near a fingernail or toenail.  This condition may cause pain and swelling around the nail.  Soak the fingers or toes in warm water as told by your doctor.  Usually, this condition is not serious, and it clears up with treatment. This information is not intended to replace advice given to you by your health care provider. Make sure you discuss any questions you have with your health care provider. Document Revised: 12/08/2019 Document Reviewed: 12/08/2019 Elsevier Patient Education  2021 Elsevier Inc.    Hypertension, Adult Hypertension is another name for high blood pressure. High blood pressure forces your heart to work harder to pump blood. This can cause problems over time. There are two numbers in a blood pressure reading. There is a top number (systolic) over a bottom number (diastolic). It is best to have a blood pressure that is below 120/80. Healthy choices can help lower your blood pressure, or you may need medicine to help lower it. What are the causes? The cause of this condition is not  known. Some conditions may be related to high blood pressure. What increases the risk?  Smoking.  Having type 2 diabetes mellitus, high cholesterol, or both.  Not getting enough exercise or physical activity.  Being overweight.  Having too much fat, sugar, calories, or salt (sodium) in your diet.  Drinking too much alcohol.  Having long-term (chronic) kidney disease.  Having a family history of high blood pressure.  Age. Risk increases with age.  Race. You may be at higher risk if you are African American.  Gender. Men are at higher risk than women before age  65. After age 26, women are at higher risk than men.  Having obstructive sleep apnea.  Stress. What are the signs or symptoms?  High blood pressure may not cause symptoms. Very high blood pressure (hypertensive crisis) may cause: ? Headache. ? Feelings of worry or nervousness (anxiety). ? Shortness of breath. ? Nosebleed. ? A feeling of being sick to your stomach (nausea). ? Throwing up (vomiting). ? Changes in how you see. ? Very bad chest pain. ? Seizures. How is this treated?  This condition is treated by making healthy lifestyle changes, such as: ? Eating healthy foods. ? Exercising more. ? Drinking less alcohol.  Your health care provider may prescribe medicine if lifestyle changes are not enough to get your blood pressure under control, and if: ? Your top number is above 130. ? Your bottom number is above 80.  Your personal target blood pressure may vary. Follow these instructions at home: Eating and drinking  If told, follow the DASH eating plan. To follow this plan: ? Fill one half of your plate at each meal with fruits and vegetables. ? Fill one fourth of your plate at each meal with whole grains. Whole grains include whole-wheat pasta, brown rice, and whole-grain bread. ? Eat or drink low-fat dairy products, such as skim milk or low-fat yogurt. ? Fill one fourth of your plate at each meal with low-fat (lean) proteins. Low-fat proteins include fish, chicken without skin, eggs, beans, and tofu. ? Avoid fatty meat, cured and processed meat, or chicken with skin. ? Avoid pre-made or processed food.  Eat less than 1,500 mg of salt each day.  Do not drink alcohol if: ? Your doctor tells you not to drink. ? You are pregnant, may be pregnant, or are planning to become pregnant.  If you drink alcohol: ? Limit how much you use to:  0-1 drink a day for women.  0-2 drinks a day for men. ? Be aware of how much alcohol is in your drink. In the [place] one drink equals  one 12 oz bottle of beer (355 mL), one 5 oz glass of wine (148 mL), or one 1 oz glass of hard liquor (44 mL).   Lifestyle  Work with your doctor to stay at a healthy weight or to lose weight. Ask your doctor what the best weight is for you.  Get at least 30 minutes of exercise most days of the week. This may include walking, swimming, or biking.  Get at least 30 minutes of exercise that strengthens your muscles (resistance exercise) at least 3 days a week. This may include lifting weights or doing Pilates.  Do not use any products that contain nicotine or tobacco, such as cigarettes, e-cigarettes, and chewing tobacco. If you need help quitting, ask your doctor.  Check your blood pressure at home as told by your doctor.  Keep all follow-up visits as told by your doctor.  This is important.   Medicines  Take over-the-counter and prescription medicines only as told by your doctor. Follow directions carefully.  Do not skip doses of blood pressure medicine. The medicine does not work as well if you skip doses. Skipping doses also puts you at risk for problems.  Ask your doctor about side effects or reactions to medicines that you should watch for. Contact a doctor if you:  Think you are having a reaction to the medicine you are taking.  Have headaches that keep coming back (recurring).  Feel dizzy.  Have swelling in your ankles.  Have trouble with your vision. Get help right away if you:  Get a very bad headache.  Start to feel mixed up (confused).  Feel weak or numb.  Feel faint.  Have very bad pain in your: ? Chest. ? Belly (abdomen).  Throw up more than once.  Have trouble breathing. Summary  Hypertension is another name for high blood pressure.  High blood pressure forces your heart to work harder to pump blood.  For most people, a normal blood pressure is less than 120/80.  Making healthy choices can help lower blood pressure. If your blood pressure does not  get lower with healthy choices, you may need to take medicine. This information is not intended to replace advice given to you by your health care provider. Make sure you discuss any questions you have with your health care provider. Document Revised: 10/22/2017 Document Reviewed: 10/22/2017 Elsevier Patient Education  2021 Elsevier Inc.  Cooking With Less [employer] with less salt is one way to reduce the amount of sodium you get from food. Sodium is one of the elements that make up salt. It is found naturally in foods and is also added to certain foods. Depending on your condition and overall health, your health care provider or dietitian may recommend that you reduce your sodium intake. Most people should have less than 2,300 milligrams (mg) of sodium each day. If you have high blood pressure (hypertension), you may need to limit your sodium to 1,500 mg each day. Follow the tips below to help reduce your sodium intake. What are tips for eating less sodium? Reading food labels  Check the food label before buying or using packaged ingredients. Always check the label for the serving size and sodium content.  Look for products with no more than 140 mg of sodium in one serving.  Check the % Daily Value column to see what percent of the daily recommended amount of sodium is provided in one serving of the product. Foods with 5% or less in this column are considered low in sodium. Foods with 20% or higher are considered high in sodium.  Do not choose foods with salt as one of the first three ingredients on the ingredients list. If salt is one of the first three ingredients, it usually means the item is high in sodium.   Shopping  Buy sodium-free or low-sodium products. Look for the following words on food labels: ? Low-sodium. ? Sodium-free. ? Reduced-sodium. ? No salt added. ? Unsalted.  Always check the sodium content even if foods are labeled as low-sodium or no salt added.  Buy fresh  foods. Cooking  Use herbs, seasonings without salt, and spices as substitutes for salt.  Use sodium-free baking soda when baking.  Grill, braise, or roast foods to add flavor with less salt.  Avoid adding salt to pasta, rice, or hot cereals.  Drain and rinse canned vegetables, beans,  and meat before use.  Avoid adding salt when cooking sweets and desserts.  Cook with low-sodium ingredients. What foods are high in sodium? Vegetables Regular canned vegetables (not low-sodium or reduced-sodium). Sauerkraut, pickled vegetables, and relishes. Olives. French fries. Onion rings. Regular canned tomato sauce and paste. Regular tomato and vegetable juice. Frozen vegetabl[place]es in sauces. Grains Instant hot cereals. Bread stuffing, pancake, and biscuit mixes. Croutons. Seasoned rice or pasta mixes. Noodle soup cups. Boxed or frozen macaroni and cheese. Regular salted crackers. Self-rising flour. Rolls. Bagels. Flour tortillas and wraps. Meats and other proteins Meat or fish that is salted, canned, smoked, cured, spiced, or pickled. This includes bacon, ham, sausages, hot dogs, corned beef, chipped beef, meat loaves, salt pork, jerky, pickled herring, anchovies, regular canned tuna, and sardines. Salted nuts. Dairy Processed cheese and cheese spreads. Cheese curds. Blue cheese. Feta cheese. String cheese. Regular cottage cheese. Buttermilk. Canned milk. The items listed above may not be a complete list of foods high in sodium. Actual amounts of sodium may be different depending on processing. Contact a dietitian for more information. What foods are low in sodium? Fruits Fresh, frozen, or canned fruit with no sauce added. Fruit juice. Vegetables Fresh or frozen vegetables with no sauce added. "No salt added" canned vegetables. "No salt added" tomato sauce and paste. Low-sodium or reduced-sodium tomato and vegetable juice. Grains Noodles, pasta, quinoa, rice. Shredded or puffed wheat or puffed rice.  Regular or quick oats (not instant). Low-sodium crackers. Low-sodium bread. Whole-grain bread and whole-grain pasta. Unsalted popcorn. Meats and other proteins Fresh or frozen whole meats, poultry (not injected with sodium), and fish with no sauce added. Unsalted nuts. Dried peas, beans, and lentils without added salt. Unsalted canned beans. Eggs. Unsalted nut butters. Low-sodium canned tuna or chicken. Dairy Milk. Soy milk. Yogurt. Low-sodium cheeses, such as Swiss, [address] [place] [address] Jack, [place]mozzarella, and [employer]ricotta. Sherbet or ice cream (keep to  cup per serving). Cream cheese. Fats and oils Unsalted butter or margarine. Other foods Homemade pudding. Sodium-free baking soda and baking powder. Herbs and spices. Low-sodium seasoning mixes. Beverages Coffee and tea. Carbonated beverages. The items listed above may not be a complete list of foods low in sodium. Actual amounts of sodium may be different depending on processing. Contact a dietitian for more information. What are some salt alternatives when cooking? The following are herbs, seasonings, and spices that can be used instead of salt to flavor your food. Herbs should be fresh or dried. Do not choose packaged mixes. Next to the name of the herb, spice, or seasoning are some examples of foods you can pair it with. Herbs  Bay leaves - Soups, meat and vegetable dishes, and spaghetti sauce.  Basil - [place] In[place]alian dishes, soups, pasta, and fish dishes.  Cilantro - Meat, poultry, and vegetable dishes.  Chili powder - Marinades and Mexican dishes.  Chives - Salad dressings and potato dishes.  Cumin - Mexican dishes, couscous, and meat dishes.  Dill - Fish dishes, sauces, and salads.  Fennel - Meat and vegetable dishes, breads, and cookies.  Garlic (do not use garlic salt) - [place] dishes, meat dishes, salad dressings, and sauces.  Marjoram - Soups, potato dishes, and meat dishes.  Oregano - Pizza and spaghetti sauce.  Parsley - Salads, soups,  pasta, and meat dishes.  Rosemary - [place] dishes, salad dressings, soups, and red meats.  Saffron - Fish dishes, pasta, and some poultry dishes.  Sage - Stuffings and sauces.  Tarragon - Fish and Whole Foodspoultry dishes.  Thyme -  Stuffing, meat, and fish dishes. Seasonings  Lemon juice - Fish dishes, poultry dishes, vegetables, and salads.  Vinegar - Salad dressings, vegetables, and fish dishes. Spices  Cinnamon - Sweet dishes, such as cakes, cookies, and puddings.  Cloves - Gingerbread, puddings, and marinades for meats.  Curry - Vegetable dishes, fish and poultry dishes, and stir-fry dishes.  Ginger - Vegetable dishes, fish dishes, and stir-fry dishes.  Nutmeg - Pasta, vegetables, poultry, fish dishes, and custard. Summary  Cooking with less salt is one way to reduce the amount of sodium that you get from food.  Buy sodium-free or low-sodium products.  Check the food label before using or buying packaged ingredients.  Use herbs, seasonings without salt, and spices as substitutes for salt in foods. This information is not intended to replace advice given to you by your health care provider. Make sure you discuss any questions you have with your health care provider. Document Revised: 02/03/2019 Document Reviewed: 02/03/2019 Elsevier Patient Education  2021 [employer].

## 2020-05-31 NOTE — Progress Notes (Signed)
   Subjective:    Patient ID: [name], female    DOB: 04/24/1965, 55 y.o.   MRN: [id number]  HPI  55 yo F presents with 2-3 day history of inflammation at base of right third fingernail. Denies trauma. Unsure if splinter or tiny puncture a  Possibility Left hand predominant  Tender,red, mildly swollen middle digit lower nail margin No organized infection Finger otherwise not swollen Good cap fill Patient reports she soaked x 1 with epsom salts -no change No fever or malaise  Covid vaccine x 2, has not had booster Does not get Flu shots Smokes 1.5 ppd x 25 years  HTN history -not recently checked- took her medication today  PCP left practice-has not re-estalished with partner MD/provider Has had difficulty with headache- treats with ibuprofen-discussed Has had intermittent  visual changes with decease clarity- not today Last eye exam was " years ago"- encouraged to update  Weight reported as "up" No exercise program No dietary controls in place-likes fast foods- no salt limits  Review of Systems As noted above     Objective:   Physical Exam Vitals and nursing note reviewed.  Constitutional:      Appearance: Normal appearance. She is obese.     Comments: Mild distress tender finger  Denies H/A today  Reports personal and family stress  HENT:     Head: Normocephalic.     Mouth/Throat:     Mouth: Mucous membranes are moist.  Eyes:     Extraocular Movements: Extraocular movements intact.  Cardiovascular:     Rate and Rhythm: Regular rhythm.     Heart sounds: Normal heart sounds.  Pulmonary:     Effort: Pulmonary effort is normal.     Breath sounds: Normal breath sounds.     Comments: 98 % 02 Musculoskeletal:        General: Swelling and tenderness present.     Cervical back: Normal range of motion and neck supple.     Comments: Right third finger lower nail margin- not organized  Skin:    General: Skin is warm.     Capillary Refill: Capillary refill takes  less than 2 seconds.     Findings: Erythema present.     Comments: Tender raised erythematous ridge at base of right 3rd finger - very tender to touch Lateral margin with tiny dark site cannot r/o splinter Too tender to explore today  Neurological:     General: No focal deficit present.     Mental Status: She is alert.  Psychiatric:        Mood and Affect: Mood normal.        Behavior: Behavior normal.     Comments: Uses cigarettes as stress relief at office and home       Assessment & Plan:  24 hour follow up requested Wrap in place for comfort- add warm water soaks with Epsom salt or Dish squirt as preferred. Shower or tub/hairwash is fine ATb topical ointment and protective dressing  Check BP tomorrow- avoid ibuprofen, use tylenol for discomfort Discussed techniques for reduction smoking Increase exercise with walk around office parking lot before smoke  Encourage Covid booster and Flu shot  DASH information - salt restriction- PCP re-connection Eye exam  Dental exam encouraged  Questions fielded , recommendations made Rx to [facility] , [name] RTC tomorrow

## 2020-06-01 ENCOUNTER — Ambulatory Visit: Payer: Managed Care, Other (non HMO) | Admitting: Physician Assistant

## 2020-06-01 ENCOUNTER — Encounter: Payer: Self-pay | Admitting: Physician Assistant

## 2020-06-01 VITALS — BP 170/95 | HR 88

## 2020-06-01 DIAGNOSIS — L03011 Cellulitis of right finger: Secondary | ICD-10-CM | POA: Diagnosis not present

## 2020-06-01 NOTE — Progress Notes (Signed)
   Subjective:    Patient ID: [name], female    DOB: 1965-09-18, 55 y.o.   MRN: [id number]  HPI Patient seen yesterday with early paronychia third finger right hand Was able to soak it multiple time yesterday evening and redressed with Mupirocin ointment and bulk wrap-  Overall feels better though area of point tenderness increased  Review of Systems HTN -- 170/95 today - denies h/a, visual changes, CP , SOB Has tried to minimize smoking Family stressors significant Office is busy but overall she enjoys co-workers, employees    Objective:   Physical Exam Paronychia right third has organized  Area cleansed - point opened with BP 10 and frank pus expressed Wound probed, well tolerated- no foreign body identified Area redressed with bulk wrap Patient tolerated well- expresses increased comfort post I& D      Assessment & Plan:  I &D performed-well tolerated-wound dressed- post op reviewed  Defer po Atb for continued soaks and mupirocin- She understands process and will continue 2-3 times a day  Discussed with patient F/U call Monday - visit f/u if not markedly improved  Strongly encouraged her to contact PCP office today to establish care with provider on site as her provider has left the practice. Request BP check next available- report to UC or ER if any acute changes- reviewed.  AVS information reviewed-questions fielded , recommendations made

## 2020-08-17 ENCOUNTER — Other Ambulatory Visit: Payer: Self-pay

## 2020-08-17 ENCOUNTER — Ambulatory Visit (INDEPENDENT_AMBULATORY_CARE_PROVIDER_SITE_OTHER): Payer: Managed Care, Other (non HMO) | Admitting: Internal Medicine

## 2020-08-17 ENCOUNTER — Encounter: Payer: Self-pay | Admitting: Internal Medicine

## 2020-08-17 VITALS — BP 178/66 | HR 75 | Temp 97.5°F | Resp 17 | Ht 61.0 in | Wt 187.6 lb

## 2020-08-17 DIAGNOSIS — E782 Mixed hyperlipidemia: Secondary | ICD-10-CM | POA: Diagnosis not present

## 2020-08-17 DIAGNOSIS — K219 Gastro-esophageal reflux disease without esophagitis: Secondary | ICD-10-CM

## 2020-08-17 DIAGNOSIS — E66811 Obesity, class 1: Secondary | ICD-10-CM | POA: Insufficient documentation

## 2020-08-17 DIAGNOSIS — I10 Essential (primary) hypertension: Secondary | ICD-10-CM | POA: Diagnosis not present

## 2020-08-17 DIAGNOSIS — G4733 Obstructive sleep apnea (adult) (pediatric): Secondary | ICD-10-CM | POA: Diagnosis not present

## 2020-08-17 DIAGNOSIS — F419 Anxiety disorder, unspecified: Secondary | ICD-10-CM

## 2020-08-17 DIAGNOSIS — Z0001 Encounter for general adult medical examination with abnormal findings: Secondary | ICD-10-CM | POA: Diagnosis not present

## 2020-08-17 DIAGNOSIS — Z6835 Body mass index (BMI) 35.0-35.9, adult: Secondary | ICD-10-CM

## 2020-08-17 DIAGNOSIS — E6609 Other obesity due to excess calories: Secondary | ICD-10-CM | POA: Insufficient documentation

## 2020-08-17 DIAGNOSIS — Z1231 Encounter for screening mammogram for malignant neoplasm of breast: Secondary | ICD-10-CM | POA: Diagnosis not present

## 2020-08-17 DIAGNOSIS — F5104 Psychophysiologic insomnia: Secondary | ICD-10-CM

## 2020-08-17 DIAGNOSIS — Z9989 Dependence on other enabling machines and devices: Secondary | ICD-10-CM

## 2020-08-17 MED ORDER — LISINOPRIL 20 MG PO TABS: 20.0000 mg | ORAL_TABLET | Freq: Every day | ORAL | 0 refills | Status: DC

## 2020-08-17 MED ORDER — FLUOXETINE HCL 10 MG PO CAPS: 20.0000 mg | ORAL_CAPSULE | Freq: Every day | ORAL | 0 refills | Status: DC

## 2020-08-17 MED ORDER — ATORVASTATIN CALCIUM 20 MG PO TABS: 20.0000 mg | ORAL_TABLET | Freq: Every day | ORAL | 0 refills | Status: DC

## 2020-08-17 NOTE — Progress Notes (Signed)
Subjective:    Patient ID: [name], female    DOB: [birth year], 55 y.o.   MRN: [id number]  HPI  Patient presents the clinic today for her annual exam.  She is also due to follow-up chronic conditions.  HTN: Her BP today is 178/66.  She is not taking Lisinopril as prescribed. ECG from 05/2016 reviewed.  HLD: Her last LDL was 255, triglycerides 182, 04/2019.  She is not taking Atorvastatin.  She tries to consume a low-fat diet.  OSA: She averages 4.5 hours of sleep per night without the use of her CPAP.  There is no sleep study on file.  Anxiety: Chronic, but she is not taking her Fluoxetine.  She is not currently seeing a therapist.  She denies depression, SI/HI.  Insomnia: She has difficulty.  She is not taking Ambien, she reports it was not effective. She takes Tylenol PM every night. There is no sleep study on file.  GERD: Triggered by tomato based foods.  She takes Tums or Rolaids OTC as needed with good relief of symptoms.  There is no upper GI on file.  Flu: never Tetanus: 01/2018 COVID: Pfizer x2 Pap smear: 05/2019 Mammogram: 06/2015 Colon screening: never Vision screening: as needed Dentist: annually  Diet: She does eat meat. She consumes some fruits and veggies. She does eat some fried foods. She drinks mostly Dt. Coke. Exercise: None  Review of Systems     Past Medical History:  Diagnosis Date   Anxiety    Fatigue    Hyperlipemia    Irregular menstrual cycle    Obesity     Current Outpatient Medications  Medication Sig Dispense Refill   atorvastatin (LIPITOR) 20 MG tablet Take 1 tablet (20 mg total) by mouth daily. 90 tablet 1   FLUoxetine (PROZAC) 10 MG capsule Take 2 capsules (20 mg total) by mouth daily. 180 capsule 1   lisinopril (ZESTRIL) 20 MG tablet Take 1 tablet (20 mg total) by mouth daily. 90 tablet 1   meclizine (ANTIVERT) 25 MG tablet Take 1 tablet (25 mg total) by mouth 3 (three) times daily as needed for dizziness. 30 tablet 0   ondansetron  (ZOFRAN-ODT) 4 MG disintegrating tablet Take 1 tablet (4 mg total) by mouth every 8 (eight) hours as needed for nausea or vomiting. 20 tablet 0   zolpidem (AMBIEN) 10 MG tablet Take 1 tablet (10 mg total) by mouth at bedtime as needed for sleep. 30 tablet 1   No current facility-administered medications for this visit.    No Known Allergies  Family History  Problem Relation Age of Onset   Heart disease Father    Hypertension Father    Thyroid disease Father    Heart attack Father 79   CAD Father    Kidney failure Father    Hypertension Mother    Heart failure Maternal Grandmother    Lung cancer Maternal Grandfather    Cancer Paternal Grandmother    Coronary artery disease Paternal Grandmother    Cancer Paternal Grandfather    Diabetes Neg Hx     Social History   Socioeconomic History   Marital status: Married    Spouse name: Not on file   Number of children: Not on file   Years of education: Not on file   Highest education level: High school graduate  Occupational History   Not on file  Tobacco Use   Smoking status: Every Day    Packs/day: 1.50    Years: 25.00  Pack years: 37.50    Types: Cigarettes    Start date: 03/23/1993   Smokeless tobacco: Never  Vaping Use   Vaping Use: Never used  Substance and Sexual Activity   Alcohol use: No    Alcohol/week: 0.0 standard drinks   Drug use: No   Sexual activity: Yes    Birth control/protection: Post-menopausal  Other Topics Concern   Not on file  Social History Narrative   Not on file   Social Determinants of Health   Financial Resource Strain: Not on file  Food Insecurity: Not on file  Transportation Needs: Not on file  Physical Activity: Not on file  Stress: Not on file  Social Connections: Not on file  Intimate Partner Violence: Not on file     Constitutional: Denies fever, malaise, fatigue, headache or abrupt weight changes.  HEENT: Denies eye pain, eye redness, ear pain, ringing in the ears, wax  buildup, runny nose, nasal congestion, bloody nose, or sore throat. Respiratory: Denies difficulty breathing, shortness of breath, cough or sputum production.   Cardiovascular: Denies chest pain, chest tightness, palpitations or swelling in the hands or feet.  Gastrointestinal: Patient reports intermittent reflux.  Denies abdominal pain, bloating, constipation, diarrhea or blood in the stool.  GU: Denies urgency, frequency, pain with urination, burning sensation, blood in urine, odor or discharge. Musculoskeletal: Denies decrease in range of motion, difficulty with gait, muscle pain or joint pain and swelling.  Skin: Denies redness, rashes, lesions or ulcercations.  Neurological: Patient reports insomnia.  Denies dizziness, difficulty with memory, difficulty with speech or problems with balance and coordination.  Psych: Patient has a history of anxiety.  Denies depression, SI/HI.  No other specific complaints in a complete review of systems (except as listed in HPI above).  Objective:   Physical Exam BP (!) 178/66 (BP Location: Right Arm, Patient Position: Sitting, Cuff Size: Large)   Pulse 75   Temp (!) 97.5 F (36.4 C) (Temporal)   Resp 17   Ht 5\' 1"  (1.549 m)   Wt 187 lb 9.6 oz (85.1 kg)   SpO2 100%   BMI 35.45 kg/m   Wt Readings from Last 3 Encounters:  05/31/20 184 lb (83.5 kg)  11/08/19 187 lb 9.6 oz (85.1 kg)  07/26/19 178 lb (80.7 kg)    General: Appears her stated age, obese, in NAD. Skin: Warm, dry and intact.  Sun damaged skin noted HEENT: Head: normal shape and size; Eyes: sclera white and EOMs intact;  Neck:  Neck supple, trachea midline. No masses, lumps or thyromegaly present.  Cardiovascular: Normal rate and rhythm. S1,S2 noted.  No murmur, rubs or gallops noted. No JVD or BLE edema. No carotid bruits noted. Pulmonary/Chest: Normal effort and positive vesicular breath sounds. No respiratory distress. No wheezes, rales or ronchi noted.  Abdomen: Soft and nontender.  Normal bowel sounds. No distention or masses noted. Liver, spleen and kidneys non palpable. Musculoskeletal: Strength 5/5 BUE/BLE.  No difficulty with gait.  Neurological: Alert and oriented. Cranial nerves II-XII grossly intact. Coordination normal.  Psychiatric: Mood and affect normal. Behavior is normal. Judgment and thought content normal.   BMET    Component Value Date/Time   NA 140 05/26/2019 0908   NA 144 03/18/2018 0922   NA 140 06/16/2013 1919   K 4.3 05/26/2019 0908   K 3.5 06/16/2013 1919   CL 107 05/26/2019 0908   CL 107 06/16/2013 1919   CO2 28 05/26/2019 0908   CO2 27 06/16/2013 1919   GLUCOSE  91 05/26/2019 0908   GLUCOSE 127 (H) 06/16/2013 1919   BUN 10 05/26/2019 0908   BUN 11 03/18/2018 0922   BUN 13 06/16/2013 1919   CREATININE 0.78 05/26/2019 0908   CALCIUM 9.7 05/26/2019 0908   CALCIUM 8.7 06/16/2013 1919   GFRNONAA 87 05/26/2019 0908   GFRAA 101 05/26/2019 0908    Lipid Panel     Component Value Date/Time   CHOL 330 (H) 05/26/2019 0908   CHOL 300 (H) 01/02/2018 1030   TRIG 182 (H) 05/26/2019 0908   HDL 39 (L) 05/26/2019 0908   HDL 38 (L) 01/02/2018 1030   CHOLHDL 8.5 (H) 05/26/2019 0908   LDLCALC 255 (H) 05/26/2019 0908    CBC    Component Value Date/Time   WBC 9.4 05/26/2019 0908   RBC 4.90 05/26/2019 0908   HGB 14.6 05/26/2019 0908   HGB 14.9 03/13/2017 0843   HCT 43.6 05/26/2019 0908   HCT 44.7 03/13/2017 0843   PLT 260 05/26/2019 0908   PLT 291 03/13/2017 0843   MCV 89.0 05/26/2019 0908   MCV 91 03/13/2017 0843   MCV 88 06/16/2013 1919   MCH 29.8 05/26/2019 0908   MCHC 33.5 05/26/2019 0908   RDW 12.6 05/26/2019 0908   RDW 13.3 03/13/2017 0843   RDW 12.9 06/16/2013 1919   LYMPHSABS 2,594 05/26/2019 0908   LYMPHSABS 2.9 03/13/2017 0843   EOSABS 254 05/26/2019 0908   EOSABS 0.2 03/13/2017 0843   BASOSABS 85 05/26/2019 0908   BASOSABS 0.1 03/13/2017 0843    Hgb A1C Lab Results  Component Value Date   HGBA1C 4.9 05/26/2019             Assessment & Plan:   Preventative Health Maintenance:  Encouraged her to get a flu shot in fall Tetanus UTD Encouraged her to get her COVID-vaccine Pap smear UTD Mammogram ordered-she will call to schedule She declines referral to GI for screening colonoscopy Encouraged her to consume a balanced diet and exercise regimen Advised her to see an eye doctor and dentist annually Will check CBC, CMET, TSH, Lipid and A1C today  RTC in 1 year, sooner if needed Nicki Reaper, NP This visit occurred during the SARS-CoV-2 public health emergency.  Safety protocols were in place, including screening questions prior to the visit, additional usage of staff PPE, and extensive cleaning of exam room while observing appropriate contact time as indicated for disinfecting solutions.

## 2020-08-17 NOTE — Assessment & Plan Note (Signed)
Avoid foods that trigger reflux Encouraged weight loss as this can help reduce reflux symptoms Okay to continue Tums or Rolaids OTC as needed 

## 2020-08-17 NOTE — Assessment & Plan Note (Addendum)
C-Met and lipid profile today Atorvastatin refilled today and encouraged her to take this to reduce her risk of stroke, heart attack and death Reinforced low-fat diet and exercise for weight loss

## 2020-08-17 NOTE — Assessment & Plan Note (Signed)
Noncompliant with CPAP use Encouraged weight loss as this can help reduce sleep apnea symptoms

## 2020-08-17 NOTE — Patient Instructions (Signed)

## 2020-08-17 NOTE — Assessment & Plan Note (Signed)
Encourage diet and exercise for weight loss 

## 2020-08-17 NOTE — Assessment & Plan Note (Signed)
Persistent Fluoxetine refilled today Support offered

## 2020-08-17 NOTE — Assessment & Plan Note (Signed)
We will see if this improves once starting back on Fluoxetine Encourage CPAP use

## 2020-08-17 NOTE — Assessment & Plan Note (Signed)
Uncontrolled due to medication noncompliance Discussed risk of heart attack, stroke and even death Lisinopril refilled today Reinforced DASH diet and exercise weight loss C-Met today

## 2020-09-01 ENCOUNTER — Ambulatory Visit: Payer: Managed Care, Other (non HMO)

## 2020-09-01 DIAGNOSIS — Z0001 Encounter for general adult medical examination with abnormal findings: Secondary | ICD-10-CM

## 2020-09-01 NOTE — Progress Notes (Signed)
BP not at goal. Have her increase Lisinopril to 2 tabs (40 mg) update on the MAR. RTC in 1 week for nurse visit BP check.

## 2020-09-01 NOTE — Progress Notes (Signed)
The pt state she switch from taking her Lisinopril at bedtime to the morning last week, because she felt like it was keeping her up. She just took her Lisinopril today about 1.5hrs ago prior to getting her blood pressure checked.

## 2020-09-02 LAB — CBC
HCT: 42.3 % (ref 35.0–45.0)
Hemoglobin: 13.8 g/dL (ref 11.7–15.5)
MCH: 29.4 pg (ref 27.0–33.0)
MCHC: 32.6 g/dL (ref 32.0–36.0)
MCV: 90.2 fL (ref 80.0–100.0)
MPV: 9.7 fL (ref 7.5–12.5)
Platelets: 259 10*3/uL (ref 140–400)
RBC: 4.69 10*6/uL (ref 3.80–5.10)
RDW: 12.6 % (ref 11.0–15.0)
WBC: 12 10*3/uL — ABNORMAL HIGH (ref 3.8–10.8)

## 2020-09-02 LAB — HEMOGLOBIN A1C
Hgb A1c MFr Bld: 4.8 % of total Hgb (ref <5.7)
Mean Plasma Glucose: 91 mg/dL
eAG (mmol/L): 5 mmol/L

## 2020-09-02 LAB — COMPLETE METABOLIC PANEL WITH GFR
AG Ratio: 2 (calc) (ref 1.0–2.5)
ALT: 17 U/L (ref 6–29)
AST: 16 U/L (ref 10–35)
Albumin: 4.3 g/dL (ref 3.6–5.1)
Alkaline phosphatase (APISO): 102 U/L (ref 37–153)
BUN: 12 mg/dL (ref 7–25)
CO2: 26 mmol/L (ref 20–32)
Calcium: 9.5 mg/dL (ref 8.6–10.4)
Chloride: 103 mmol/L (ref 98–110)
Creat: 0.77 mg/dL (ref 0.50–1.05)
GFR, Est African American: 102 mL/min/{1.73_m2} (ref >=60)
GFR, Est Non African American: 88 mL/min/{1.73_m2} (ref >=60)
Globulin: 2.1 g/dL (calc) (ref 1.9–3.7)
Glucose, Bld: 89 mg/dL (ref 65–99)
Potassium: 4.1 mmol/L (ref 3.5–5.3)
Sodium: 139 mmol/L (ref 135–146)
Total Bilirubin: 0.4 mg/dL (ref 0.2–1.2)
Total Protein: 6.4 g/dL (ref 6.1–8.1)

## 2020-09-02 LAB — LIPID PANEL
Cholesterol: 274 mg/dL — ABNORMAL HIGH (ref <200)
HDL: 44 mg/dL — ABNORMAL LOW (ref >=50)
LDL Cholesterol (Calc): 196 mg/dL (calc) — ABNORMAL HIGH
Non-HDL Cholesterol (Calc): 230 mg/dL (calc) — ABNORMAL HIGH (ref <130)
Total CHOL/HDL Ratio: 6.2 (calc) — ABNORMAL HIGH (ref <5.0)
Triglycerides: 168 mg/dL — ABNORMAL HIGH (ref <150)

## 2020-09-02 LAB — TSH: TSH: 1.1 mIU/L

## 2020-09-04 ENCOUNTER — Other Ambulatory Visit: Payer: Self-pay | Admitting: Internal Medicine

## 2020-09-04 ENCOUNTER — Telehealth: Payer: Self-pay

## 2020-09-04 DIAGNOSIS — E782 Mixed hyperlipidemia: Secondary | ICD-10-CM

## 2020-09-04 MED ORDER — ATORVASTATIN CALCIUM 40 MG PO TABS
40.0000 mg | ORAL_TABLET | Freq: Every day | ORAL | 2 refills | Status: DC
Start: 2020-09-04 — End: 2021-04-12

## 2020-09-04 NOTE — Telephone Encounter (Signed)
-----   Message from Amanda Munroe, NP sent at 09/02/2020  4:00 PM EDT ----- No diabetes.  White count is slightly elevated but not overall concerning.  Has she been feeling ill lately or does she smoke?  Blood counts otherwise normal.  Liver and kidney function are normal.  Thyroid is normal.  Cholesterol is elevated.  Is she taking atorvastatin 20 mg as prescribed?  If so we need to increase to 40 mg daily.  Let me know and I will send in.  Will need repeat lipid profile in 3 months, lab only.

## 2020-09-04 NOTE — Telephone Encounter (Signed)
Lisinopril in am, Fluoxetine and Atorvastatin in pm. Did she answer my question about taking the Atorvastatin as prescribed?

## 2020-09-04 NOTE — Telephone Encounter (Signed)
When I called the patient to notify her of her lab results. She said she was concern that one of her medications was causing her to feel tired. She is currently taking Atorvastatin, Prozac, and Lisinopril. She want to know the best time to take them. Please advise

## 2020-09-04 NOTE — Addendum Note (Signed)
Addended by: Lonna Cobb on: 09/04/2020 11:54 AM   Modules accepted: Orders

## 2020-09-06 ENCOUNTER — Telehealth: Payer: Self-pay

## 2020-09-06 NOTE — Telephone Encounter (Signed)
-----   Message from Lorre Munroe, NP sent at 09/01/2020 11:43 AM EDT -----    ----- Message ----- From: Lonna Cobb, CMA Sent: 09/01/2020   8:12 AM EDT To: Lorre Munroe, NP  2 week f/u on blood pressure check. Her blood pressure today manually was 158/80 Rt arm

## 2020-09-06 NOTE — Telephone Encounter (Signed)
He pt was notified and is taking the Lisinopril 40MG . She will come Wednesday at 11:30am for blood pressure check.

## 2020-09-08 ENCOUNTER — Telehealth: Payer: Self-pay

## 2020-09-08 MED ORDER — LISINOPRIL 40 MG PO TABS
40.0000 mg | ORAL_TABLET | Freq: Every day | ORAL | 2 refills | Status: DC
Start: 2020-09-08 — End: 2020-09-14

## 2020-09-08 NOTE — Telephone Encounter (Signed)
I spoke with the patient and clarified that she suppose to be taking two (2) of her Lisinopril 20MG  and when she finish the script she can pick up the 40MG  prescription. I also informed her if she have any complications or issues with the increase to notify use. She verbalize understanding, no questions or concerns.

## 2020-09-08 NOTE — Telephone Encounter (Signed)
Copied from CRM [id number]. Topic: Quick Communication - Rx Refill/Question >> Sep 07, 2020 10:42 AM Randol Kern wrote: Pt would like a call back from nurse to discuss her BP medication  [phone]

## 2020-09-13 ENCOUNTER — Ambulatory Visit: Payer: Managed Care, Other (non HMO)

## 2020-09-13 ENCOUNTER — Other Ambulatory Visit: Payer: Self-pay

## 2020-09-13 VITALS — BP 178/66 | HR 64

## 2020-09-13 DIAGNOSIS — Z0130 Encounter for examination of blood pressure without abnormal findings: Secondary | ICD-10-CM

## 2020-09-13 NOTE — Progress Notes (Signed)
The pt comes in today for a 1 week  blood pressure check after increasing hr Lisinopril from 20 MG to 40 MG. Her previous bp on 09/01/2020 was 158/80 and bp today is 178/66. She report that she is currently taking the increase dosage at bedtime.

## 2020-09-14 MED ORDER — OLMESARTAN MEDOXOMIL 20 MG PO TABS: 20.0000 mg | ORAL_TABLET | Freq: Every day | ORAL | 0 refills | Status: DC

## 2020-09-14 NOTE — Progress Notes (Signed)
D/c Lisinopril RX for Olmesartan 20 mg daily  Follow up with me in 2 weeks Nicki Reaper, NP

## 2020-09-14 NOTE — Addendum Note (Signed)
Addended by: Lorre Munroe on: 09/14/2020 11:22 AM   Modules accepted: Orders

## 2020-10-09 ENCOUNTER — Other Ambulatory Visit: Payer: Self-pay

## 2020-10-09 ENCOUNTER — Ambulatory Visit: Payer: Managed Care, Other (non HMO) | Admitting: Internal Medicine

## 2020-10-09 ENCOUNTER — Encounter: Payer: Self-pay | Admitting: Internal Medicine

## 2020-10-09 DIAGNOSIS — Z6835 Body mass index (BMI) 35.0-35.9, adult: Secondary | ICD-10-CM

## 2020-10-09 DIAGNOSIS — E66812 Obesity, class 2: Secondary | ICD-10-CM

## 2020-10-09 DIAGNOSIS — I10 Essential (primary) hypertension: Secondary | ICD-10-CM

## 2020-10-09 DIAGNOSIS — F5104 Psychophysiologic insomnia: Secondary | ICD-10-CM | POA: Diagnosis not present

## 2020-10-09 MED ORDER — TRAZODONE HCL 50 MG PO TABS: 25.0000 mg | ORAL_TABLET | Freq: Every evening | ORAL | 0 refills | Status: DC | PRN

## 2020-10-09 MED ORDER — OLMESARTAN MEDOXOMIL 20 MG PO TABS: 20.0000 mg | ORAL_TABLET | Freq: Every day | ORAL | 3 refills | Status: DC

## 2020-10-09 NOTE — Patient Instructions (Signed)
Insomnia Insomnia is a sleep disorder that makes it difficult to fall asleep or stay asleep. Insomnia can cause fatigue, low energy, difficulty concentrating, moodswings, and poor performance at work or school. There are three different ways to classify insomnia: Difficulty falling asleep. Difficulty staying asleep. Waking up too early in the morning. Any type of insomnia can be long-term (chronic) or short-term (acute). Both are common. Short-term insomnia usually lasts for three months or less. Chronic insomnia occurs at least three times a week for longer than threemonths. What are the causes? Insomnia may be caused by another condition, situation, or substance, such as: Anxiety. Certain medicines. Gastroesophageal reflux disease (GERD) or other gastrointestinal conditions. Asthma or other breathing conditions. Restless legs syndrome, sleep apnea, or other sleep disorders. Chronic pain. Menopause. Stroke. Abuse of alcohol, tobacco, or illegal drugs. Mental health conditions, such as depression. Caffeine. Neurological disorders, such as Alzheimer's disease. An overactive thyroid (hyperthyroidism). Sometimes, the cause of insomnia may not be known. What increases the risk? Risk factors for insomnia include: Gender. Women are affected more often than men. Age. Insomnia is more common as you get older. Stress. Lack of exercise. Irregular work schedule or working night shifts. Traveling between different time zones. Certain medical and mental health conditions. What are the signs or symptoms? If you have insomnia, the main symptom is having trouble falling asleep or having trouble staying asleep. This may lead to other symptoms, such as: Feeling fatigued or having low energy. Feeling nervous about going to sleep. Not feeling rested in the morning. Having trouble concentrating. Feeling irritable, anxious, or depressed. How is this diagnosed? This condition may be diagnosed based  on: Your symptoms and medical history. Your health care provider may ask about: Your sleep habits. Any medical conditions you have. Your mental health. A physical exam. How is this treated? Treatment for insomnia depends on the cause. Treatment may focus on treating an underlying condition that is causing insomnia. Treatment may also include: Medicines to help you sleep. Counseling or therapy. Lifestyle adjustments to help you sleep better. Follow these instructions at home: Eating and drinking  Limit or avoid alcohol, caffeinated beverages, and cigarettes, especially close to bedtime. These can disrupt your sleep. Do not eat a large meal or eat spicy foods right before bedtime. This can lead to digestive discomfort that can make it hard for you to sleep.  Sleep habits  Keep a sleep diary to help you and your health care provider figure out what could be causing your insomnia. Write down: When you sleep. When you wake up during the night. How well you sleep. How rested you feel the next day. Any side effects of medicines you are taking. What you eat and drink. Make your bedroom a dark, comfortable place where it is easy to fall asleep. Put up shades or blackout curtains to block light from outside. Use a white noise machine to block noise. Keep the temperature cool. Limit screen use before bedtime. This includes: Watching TV. Using your smartphone, tablet, or computer. Stick to a routine that includes going to bed and waking up at the same times every day and night. This can help you fall asleep faster. Consider making a quiet activity, such as reading, part of your nighttime routine. Try to avoid taking naps during the day so that you sleep better at night. Get out of bed if you are still awake after 15 minutes of trying to sleep. Keep the lights down, but try reading or doing a quiet   activity. When you feel sleepy, go back to bed.  General instructions Take over-the-counter  and prescription medicines only as told by your health care provider. Exercise regularly, as told by your health care provider. Avoid exercise starting several hours before bedtime. Use relaxation techniques to manage stress. Ask your health care provider to suggest some techniques that may work well for you. These may include: Breathing exercises. Routines to release muscle tension. Visualizing peaceful scenes. Make sure that you drive carefully. Avoid driving if you feel very sleepy. Keep all follow-up visits as told by your health care provider. This is important. Contact a health care provider if: You are tired throughout the day. You have trouble in your daily routine due to sleepiness. You continue to have sleep problems, or your sleep problems get worse. Get help right away if: You have serious thoughts about hurting yourself or someone else. If you ever feel like you may hurt yourself or others, or have thoughts about taking your own life, get help right away. You can go to your nearest emergency department or call: Your local emergency services (911 in the [place] A suicide crisis helpline, such as the National Suicide Prevention Lifeline at 1-800-273-8255. This is open 24 hours a day. Summary Insomnia is a sleep disorder that makes it difficult to fall asleep or stay asleep. Insomnia can be long-term (chronic) or short-term (acute). Treatment for insomnia depends on the cause. Treatment may focus on treating an underlying condition that is causing insomnia. Keep a sleep diary to help you and your health care provider figure out what could be causing your insomnia. This information is not intended to replace advice given to you by your health care provider. Make sure you discuss any questions you have with your healthcare provider. Document Revised: 12/23/2019 Document Reviewed: 12/23/2019 [employer] Patient Education  2022 [employer]  

## 2020-10-09 NOTE — Assessment & Plan Note (Signed)
Encourage diet and exercise for weight loss 

## 2020-10-09 NOTE — Progress Notes (Signed)
Subjective:    Patient ID: [name], female    DOB: [birth year], 55 y.o.   MRN: [id number]  HPI  Patient presents the clinic today for 2-week follow-up of hypertension.  She was recently switched to Olmesartan from Lisinopril due to persistently elevated blood pressures.  She is been taking the medication as prescribed and denies adverse side effects.  Her BP today is 132/52.  ECG from 05/2016 reviewed.  She also reports insomnia.  She reports difficulty falling and staying asleep.  She has tried Melatonin, Tylenol PM and Ambien in the past with minimal relief of symptoms.  Review of Systems  Past Medical History:  Diagnosis Date   Anxiety    Fatigue    Hyperlipemia    Irregular menstrual cycle    Obesity     Current Outpatient Medications  Medication Sig Dispense Refill   atorvastatin (LIPITOR) 40 MG tablet Take 1 tablet (40 mg total) by mouth daily. 30 tablet 2   FLUoxetine (PROZAC) 10 MG capsule Take 2 capsules (20 mg total) by mouth daily. 180 capsule 0   olmesartan (BENICAR) 20 MG tablet Take 1 tablet (20 mg total) by mouth daily. 30 tablet 0   No current facility-administered medications for this visit.    No Known Allergies  Family History  Problem Relation Age of Onset   Heart disease Father    Hypertension Father    Thyroid disease Father    Heart attack Father 45   CAD Father    Kidney failure Father    Hypertension Mother    Heart failure Maternal Grandmother    Lung cancer Maternal Grandfather    Cancer Paternal Grandmother    Coronary artery disease Paternal Grandmother    Cancer Paternal Grandfather    Diabetes Neg Hx     Social History   Socioeconomic History   Marital status: Married    Spouse name: Not on file   Number of children: Not on file   Years of education: Not on file   Highest education level: High school graduate  Occupational History   Not on file  Tobacco Use   Smoking status: Every Day    Packs/day: 1.50    Years: 25.00     Pack years: 37.50    Types: Cigarettes    Start date: 03/23/1993   Smokeless tobacco: Never  Vaping Use   Vaping Use: Never used  Substance and Sexual Activity   Alcohol use: No    Alcohol/week: 0.0 standard drinks   Drug use: No   Sexual activity: Yes    Birth control/protection: Post-menopausal  Other Topics Concern   Not on file  Social History Narrative   Not on file   Social Determinants of Health   Financial Resource Strain: Not on file  Food Insecurity: Not on file  Transportation Needs: Not on file  Physical Activity: Not on file  Stress: Not on file  Social Connections: Not on file  Intimate Partner Violence: Not on file     Constitutional: Denies fever, malaise, fatigue, headache or abrupt weight changes.  Respiratory: Denies difficulty breathing, shortness of breath, cough or sputum production.   Cardiovascular: Denies chest pain, chest tightness, palpitations or swelling in the hands or feet.  Neurological: Patient reports insomnia.  Denies dizziness, difficulty with memory, difficulty with speech or problems with balance and coordination.    No other specific complaints in a complete review of systems (except as listed in HPI above).  Objective:   Physical Exam  BP (!) 132/52 (BP Location: Right Arm, Patient Position: Sitting, Cuff Size: Large)   Pulse 75   Temp (!) 97.3 F (36.3 C) (Temporal)   Resp 17   Ht 5\' 1"  (1.549 m)   Wt 187 lb 6.4 oz (85 kg)   SpO2 100%   BMI 35.41 kg/m   Wt Readings from Last 3 Encounters:  08/17/20 187 lb 9.6 oz (85.1 kg)  05/31/20 184 lb (83.5 kg)  11/08/19 187 lb 9.6 oz (85.1 kg)    General: Appears her stated age, obese, in NAD. Cardiovascular: Normal rate and rhythm. S1,S2 noted.  No murmur, rubs or gallops noted.  Pulmonary/Chest: Normal effort and positive vesicular breath sounds. No respiratory distress. No wheezes, rales or ronchi noted.  Neurological: Alert and oriented.    BMET    Component  Value Date/Time   NA 139 09/01/2020 0753   NA 144 03/18/2018 0922   NA 140 06/16/2013 1919   K 4.1 09/01/2020 0753   K 3.5 06/16/2013 1919   CL 103 09/01/2020 0753   CL 107 06/16/2013 1919   CO2 26 09/01/2020 0753   CO2 27 06/16/2013 1919   GLUCOSE 89 09/01/2020 0753   GLUCOSE 127 (H) 06/16/2013 1919   BUN 12 09/01/2020 0753   BUN 11 03/18/2018 0922   BUN 13 06/16/2013 1919   CREATININE 0.77 09/01/2020 0753   CALCIUM 9.5 09/01/2020 0753   CALCIUM 8.7 06/16/2013 1919   GFRNONAA 88 09/01/2020 0753   GFRAA 102 09/01/2020 0753    Lipid Panel     Component Value Date/Time   CHOL 274 (H) 09/01/2020 0753   CHOL 300 (H) 01/02/2018 1030   TRIG 168 (H) 09/01/2020 0753   HDL 44 (L) 09/01/2020 0753   HDL 38 (L) 01/02/2018 1030   CHOLHDL 6.2 (H) 09/01/2020 0753   LDLCALC 196 (H) 09/01/2020 0753    CBC    Component Value Date/Time   WBC 12.0 (H) 09/01/2020 0753   RBC 4.69 09/01/2020 0753   HGB 13.8 09/01/2020 0753   HGB 14.9 03/13/2017 0843   HCT 42.3 09/01/2020 0753   HCT 44.7 03/13/2017 0843   PLT 259 09/01/2020 0753   PLT 291 03/13/2017 0843   MCV 90.2 09/01/2020 0753   MCV 91 03/13/2017 0843   MCV 88 06/16/2013 1919   MCH 29.4 09/01/2020 0753   MCHC 32.6 09/01/2020 0753   RDW 12.6 09/01/2020 0753   RDW 13.3 03/13/2017 0843   RDW 12.9 06/16/2013 1919   LYMPHSABS 2,594 05/26/2019 0908   LYMPHSABS 2.9 03/13/2017 0843   EOSABS 254 05/26/2019 0908   EOSABS 0.2 03/13/2017 0843   BASOSABS 85 05/26/2019 0908   BASOSABS 0.1 03/13/2017 0843    Hgb A1C Lab Results  Component Value Date   HGBA1C 4.8 09/01/2020            Assessment & Plan:   11/02/2020, NP This visit occurred during the SARS-CoV-2 public health emergency.  Safety protocols were in place, including screening questions prior to the visit, additional usage of staff PPE, and extensive cleaning of exam room while observing appropriate contact time as indicated for disinfecting solutions.

## 2020-10-09 NOTE — Assessment & Plan Note (Signed)
BP controlled on Olmesartan, refilled today [facility] reviewed Reinforced DASH diet and exercise weight loss

## 2020-10-09 NOTE — Assessment & Plan Note (Signed)
We will trial Trazodone 25 to 50 mg p.o. nightly

## 2020-10-31 ENCOUNTER — Encounter: Payer: Self-pay | Admitting: Internal Medicine

## 2020-11-27 ENCOUNTER — Ambulatory Visit: Payer: Managed Care, Other (non HMO) | Admitting: Internal Medicine

## 2020-11-27 ENCOUNTER — Encounter: Payer: Self-pay | Admitting: Internal Medicine

## 2020-11-27 ENCOUNTER — Other Ambulatory Visit: Payer: Self-pay

## 2020-11-27 VITALS — BP 146/57 | HR 90 | Temp 97.7°F | Wt 191.0 lb

## 2020-11-27 DIAGNOSIS — R109 Unspecified abdominal pain: Secondary | ICD-10-CM | POA: Diagnosis not present

## 2020-11-27 DIAGNOSIS — Z6836 Body mass index (BMI) 36.0-36.9, adult: Secondary | ICD-10-CM | POA: Diagnosis not present

## 2020-11-27 LAB — POCT URINALYSIS DIPSTICK
Bilirubin, UA: NEGATIVE
Glucose, UA: NEGATIVE
Ketones, UA: NEGATIVE
Leukocytes, UA: NEGATIVE
Nitrite, UA: NEGATIVE
Protein, UA: NEGATIVE
Spec Grav, UA: 1.01 (ref 1.010–1.025)
Urobilinogen, UA: 0.2 E.U./dL
pH, UA: 5 (ref 5.0–8.0)

## 2020-11-27 MED ORDER — HYDROCODONE-ACETAMINOPHEN 5-325 MG PO TABS: 1.0000 | ORAL_TABLET | Freq: Three times a day (TID) | ORAL | 0 refills | Status: DC | PRN

## 2020-11-27 NOTE — Patient Instructions (Signed)
Flank Pain, Adult ?Flank pain is pain in your side. The flank is the area on your side between your upper belly (abdomen) and your spine. The pain may occur over a short time (acute), or it may be long-term or come back often (chronic). It may be mild or very bad. Pain in this area can be caused by many different things. ?Follow these instructions at home: ? ?Drink enough fluid to keep your pee (urine) pale yellow. ?Rest as told by your doctor. ?Take over-the-counter and prescription medicines only as told by your doctor. ?Keep a journal to keep track of: ?What has caused your flank pain. ?What has made your flank pain feel better. ?Keep all follow-up visits. ?Contact a doctor if: ?Medicine does not help your pain. ?You have new symptoms. ?Your pain gets worse. ?Your symptoms last longer than 2-3 days. ?You have trouble peeing. ?You are peeing more often than normal. ?Get help right away if: ?You have trouble breathing. ?You are short of breath. ?Your belly hurts, or it is swollen or red. ?You feel like you may vomit (nauseous). ?You vomit. ?You feel faint, or you faint. ?You have blood in your pee. ?You have flank pain and a fever. ?These symptoms may be an emergency. Get help right away. Call your local emergency services (911 in the [place] ?Do not wait to see if the symptoms will go away. ?Do not drive yourself to the hospital. ?Summary ?Flank pain is pain in your side. The flank is the area of your side between your upper belly (abdomen) and your spine. ?Flank pain may occur over a short time (acute), or it may be long-term or come back often (chronic). It may be mild or very bad. ?Pain in this area can be caused by many different things. ?Contact your doctor if your symptoms get worse or last longer than 2-3 days. ?This information is not intended to replace advice given to you by your health care provider. Make sure you discuss any questions you have with your health care provider. ?Document Revised:  04/24/2020 Document Reviewed: 04/24/2020 ?Elsevier Patient Education ? 2022 [employer] ? ?

## 2020-11-27 NOTE — Assessment & Plan Note (Signed)
Encourage diet and exercise for weight loss 

## 2020-11-27 NOTE — Progress Notes (Signed)
Subjective:    Patient ID: [name], female    DOB: [birth year], 55 y.o.   MRN: [id number]  HPI  Pt presents to the clinic today with c/o back pain. This started 4 days ago. She describes the pain as sore and achy. The pain does not radiate. She notices it more when she lays down.She denies numbness, tingling or weakness of her lower extremities. She denies bowel, bladder or vaginal complaints. She has tried Naproxen, muscle rub and leftover muscle relaxers with minimal relief of symptoms. Xray lumbar spine from 06/2019 was normal. She had a L4-L5 fusion in the past.  Review of Systems     Past Medical History:  Diagnosis Date   Anxiety    Fatigue    Hyperlipemia    Irregular menstrual cycle    Obesity     Current Outpatient Medications  Medication Sig Dispense Refill   atorvastatin (LIPITOR) 40 MG tablet Take 1 tablet (40 mg total) by mouth daily. 30 tablet 2   FLUoxetine (PROZAC) 10 MG capsule Take 2 capsules (20 mg total) by mouth daily. 180 capsule 0   olmesartan (BENICAR) 20 MG tablet Take 1 tablet (20 mg total) by mouth daily. 90 tablet 3   traZODone (DESYREL) 50 MG tablet Take 0.5-1 tablets (25-50 mg total) by mouth at bedtime as needed for sleep. 30 tablet 0   No current facility-administered medications for this visit.    No Known Allergies  Family History  Problem Relation Age of Onset   Heart disease Father    Hypertension Father    Thyroid disease Father    Heart attack Father 44   CAD Father    Kidney failure Father    Hypertension Mother    Heart failure Maternal Grandmother    Lung cancer Maternal Grandfather    Cancer Paternal Grandmother    Coronary artery disease Paternal Grandmother    Cancer Paternal Grandfather    Diabetes Neg Hx     Social History   Socioeconomic History   Marital status: Married    Spouse name: Not on file   Number of children: Not on file   Years of education: Not on file   Highest education level: High school  graduate  Occupational History   Not on file  Tobacco Use   Smoking status: Every Day    Packs/day: 1.50    Years: 25.00    Pack years: 37.50    Types: Cigarettes    Start date: 03/23/1993   Smokeless tobacco: Never  Vaping Use   Vaping Use: Never used  Substance and Sexual Activity   Alcohol use: No    Alcohol/week: 0.0 standard drinks   Drug use: No   Sexual activity: Yes    Birth control/protection: Post-menopausal  Other Topics Concern   Not on file  Social History Narrative   Not on file   Social Determinants of Health   Financial Resource Strain: Not on file  Food Insecurity: Not on file  Transportation Needs: Not on file  Physical Activity: Not on file  Stress: Not on file  Social Connections: Not on file  Intimate Partner Violence: Not on file     Constitutional: Denies fever, malaise, fatigue, headache or abrupt weight changes.  Respiratory: Denies difficulty breathing, shortness of breath, cough or sputum production.   Cardiovascular: Denies chest pain, chest tightness, palpitations or swelling in the hands or feet.  Gastrointestinal: Denies abdominal pain, bloating, constipation, diarrhea or blood in the stool.  GU: Denies urgency, frequency, pain with urination, burning sensation, blood in urine, odor or discharge. Musculoskeletal: Pt reports low back pain, decrease in range of motion. Denies difficulty with gait, or joint swelling.  Skin: Denies redness, rashes, lesions or ulcercations.  Neurological: Denies numbness, tingling, weakness or problems with balance and coordination.    No other specific complaints in a complete review of systems (except as listed in HPI above).  Objective:   Physical Exam  BP (!) 146/57 (BP Location: Left Arm, Patient Position: Sitting, Cuff Size: Large)   Pulse 90   Temp 97.7 F (36.5 C) (Temporal)   Wt 191 lb (86.6 kg)   SpO2 100%   BMI 36.09 kg/m   Wt Readings from Last 3 Encounters:  10/09/20 187 lb 6.4 oz (85  kg)  08/17/20 187 lb 9.6 oz (85.1 kg)  05/31/20 184 lb (83.5 kg)    General: Appears her stated age, obese,  in NAD. Skin: Warm, dry and intact. No rashes noted. HEENT: Head: normal shape and size; Eyes: sclera white and EOMs intact;  Cardiovascular: Normal rate and rhythm.  Pulmonary/Chest: Normal effort and positive vesicular breath sounds. No respiratory distress. Abdomen: + CVA tenderness noted on the left. Musculoskeletal: Normal flexion of the spine. Decreased extension, rotation and lateral bending. No bony tenderness noted over the spine or paralumbar muscles. Neurological: Alert and oriented.   BMET    Component Value Date/Time   NA 139 09/01/2020 0753   NA 144 03/18/2018 0922   NA 140 06/16/2013 1919   K 4.1 09/01/2020 0753   K 3.5 06/16/2013 1919   CL 103 09/01/2020 0753   CL 107 06/16/2013 1919   CO2 26 09/01/2020 0753   CO2 27 06/16/2013 1919   GLUCOSE 89 09/01/2020 0753   GLUCOSE 127 (H) 06/16/2013 1919   BUN 12 09/01/2020 0753   BUN 11 03/18/2018 0922   BUN 13 06/16/2013 1919   CREATININE 0.77 09/01/2020 0753   CALCIUM 9.5 09/01/2020 0753   CALCIUM 8.7 06/16/2013 1919   GFRNONAA 88 09/01/2020 0753   GFRAA 102 09/01/2020 0753    Lipid Panel     Component Value Date/Time   CHOL 274 (H) 09/01/2020 0753   CHOL 300 (H) 01/02/2018 1030   TRIG 168 (H) 09/01/2020 0753   HDL 44 (L) 09/01/2020 0753   HDL 38 (L) 01/02/2018 1030   CHOLHDL 6.2 (H) 09/01/2020 0753   LDLCALC 196 (H) 09/01/2020 0753    CBC    Component Value Date/Time   WBC 12.0 (H) 09/01/2020 0753   RBC 4.69 09/01/2020 0753   HGB 13.8 09/01/2020 0753   HGB 14.9 03/13/2017 0843   HCT 42.3 09/01/2020 0753   HCT 44.7 03/13/2017 0843   PLT 259 09/01/2020 0753   PLT 291 03/13/2017 0843   MCV 90.2 09/01/2020 0753   MCV 91 03/13/2017 0843   MCV 88 06/16/2013 1919   MCH 29.4 09/01/2020 0753   MCHC 32.6 09/01/2020 0753   RDW 12.6 09/01/2020 0753   RDW 13.3 03/13/2017 0843   RDW 12.9  06/16/2013 1919   LYMPHSABS 2,594 05/26/2019 0908   LYMPHSABS 2.9 03/13/2017 0843   EOSABS 254 05/26/2019 0908   EOSABS 0.2 03/13/2017 0843   BASOSABS 85 05/26/2019 0908   BASOSABS 0.1 03/13/2017 0843    Hgb A1C Lab Results  Component Value Date   HGBA1C 4.8 09/01/2020           Assessment & Plan:  Left Flank Pain:  Urinalysis: 2+  blood We will send urine culture Stat CT renal stone study Push fluids Rx for Hydrocodone 5-325 mg every 8 hours  We will follow-up after imaging is back with further recommendations, return precautions discussed  Nicki Reaper, NP This visit occurred during the SARS-CoV-2 public health emergency.  Safety protocols were in place, including screening questions prior to the visit, additional usage of staff PPE, and extensive cleaning of exam room while observing appropriate contact time as indicated for disinfecting solutions.

## 2020-11-28 ENCOUNTER — Ambulatory Visit
Admission: RE | Admit: 2020-11-28 | Discharge: 2020-11-28 | Disposition: A | Discharge status: Home / Self Care | Payer: Managed Care, Other (non HMO) | Source: Ambulatory Visit | Attending: Internal Medicine | Admitting: Internal Medicine

## 2020-11-28 ENCOUNTER — Telehealth: Payer: Self-pay

## 2020-11-28 DIAGNOSIS — R109 Unspecified abdominal pain: Secondary | ICD-10-CM | POA: Diagnosis not present

## 2020-11-28 NOTE — Telephone Encounter (Signed)
Copied from CRM [id number]. Topic: General - Other >> Nov 27, 2020  4:35 PM Gaetana Michaelis A wrote: Reason for CRM: The patient has returned a call to N. Beasley  Please contact further when possible

## 2020-11-29 ENCOUNTER — Encounter: Payer: Self-pay | Admitting: Internal Medicine

## 2020-11-29 LAB — URINE CULTURE
MICRO NUMBER:: 12456732
Result:: NO GROWTH
SPECIMEN QUALITY:: ADEQUATE

## 2020-12-01 ENCOUNTER — Other Ambulatory Visit: Payer: Managed Care, Other (non HMO)

## 2020-12-01 DIAGNOSIS — R3129 Other microscopic hematuria: Secondary | ICD-10-CM

## 2020-12-01 NOTE — Telephone Encounter (Signed)
The pt is scheduled to come in today at 10am to give another urine sample for microscopic urinalysis.

## 2020-12-02 LAB — URINALYSIS, ROUTINE W REFLEX MICROSCOPIC
Bilirubin Urine: NEGATIVE
Glucose, UA: NEGATIVE
Hgb urine dipstick: NEGATIVE
Ketones, ur: NEGATIVE
Leukocytes,Ua: NEGATIVE
Nitrite: NEGATIVE
Protein, ur: NEGATIVE
Specific Gravity, Urine: 1.005 (ref 1.001–1.035)
pH: 7 (ref 5.0–8.0)

## 2021-02-11 ENCOUNTER — Ambulatory Visit
Admission: EM | Admit: 2021-02-11 | Discharge: 2021-02-11 | Disposition: A | Discharge status: Home / Self Care | Payer: Managed Care, Other (non HMO) | Attending: Physician Assistant | Admitting: Physician Assistant

## 2021-02-11 ENCOUNTER — Encounter: Payer: Self-pay | Admitting: Emergency Medicine

## 2021-02-11 ENCOUNTER — Other Ambulatory Visit: Payer: Self-pay

## 2021-02-11 DIAGNOSIS — J069 Acute upper respiratory infection, unspecified: Secondary | ICD-10-CM | POA: Diagnosis present

## 2021-02-11 DIAGNOSIS — Z79899 Other long term (current) drug therapy: Secondary | ICD-10-CM | POA: Diagnosis not present

## 2021-02-11 DIAGNOSIS — Z20822 Contact with and (suspected) exposure to covid-19: Secondary | ICD-10-CM | POA: Insufficient documentation

## 2021-02-11 DIAGNOSIS — Z8616 Personal history of COVID-19: Secondary | ICD-10-CM | POA: Diagnosis not present

## 2021-02-11 DIAGNOSIS — Z20828 Contact with and (suspected) exposure to other viral communicable diseases: Secondary | ICD-10-CM

## 2021-02-11 DIAGNOSIS — R52 Pain, unspecified: Secondary | ICD-10-CM | POA: Diagnosis present

## 2021-02-11 DIAGNOSIS — I10 Essential (primary) hypertension: Secondary | ICD-10-CM | POA: Diagnosis not present

## 2021-02-11 DIAGNOSIS — R051 Acute cough: Secondary | ICD-10-CM | POA: Diagnosis present

## 2021-02-11 LAB — RAPID INFLUENZA A&B ANTIGENS
Influenza A (ARMC): NEGATIVE
Influenza B (ARMC): NEGATIVE

## 2021-02-11 MED ORDER — OSELTAMIVIR PHOSPHATE 75 MG PO CAPS: 75.0000 mg | ORAL_CAPSULE | Freq: Two times a day (BID) | ORAL | 0 refills | Status: DC

## 2021-02-11 MED ORDER — PROMETHAZINE-DM 6.25-15 MG/5ML PO SYRP: 5.0000 mL | ORAL_SOLUTION | Freq: Three times a day (TID) | ORAL | 0 refills | Status: DC | PRN

## 2021-02-11 MED ORDER — ONDANSETRON 4 MG PO TBDP: 4.0000 mg | ORAL_TABLET | Freq: Three times a day (TID) | ORAL | 0 refills | Status: DC | PRN

## 2021-02-11 NOTE — ED Provider Notes (Signed)
[facility]    CSN: [id number] Arrival date & time: 02/11/21  0801      History   Chief Complaint Chief Complaint  Patient presents with   Cough   Flu Exposure    HPI [name] is a 55 y.o. female.   Patient presents today with a 12-hour history of URI symptoms.  Reports cough, fatigue, sore throat, subjective fever, body aches, nasal congestion, nausea, diarrhea.  Denies any abdominal pain, vomiting, syncope, dizziness, chest pain.  Reports husband was recently diagnosed with influenza.  She has not had influenza vaccine.  She has had COVID-19 vaccine.  She has had COVID with last episode approximately a year ago.  Denies any history of allergies, asthma, smoking.  Denies history of diabetes.  Denies any recent antibiotic use.  She has not tried any over-the-counter medication for symptom management.  Had difficulty sleeping as result of pain and cough.  Blood pressure is elevated today.  Patient reports he has not taken her blood pressure medication yet.  She denies any chest pain, shortness of breath, headache, dizziness.  She is not taking any decongestants.   Past Medical History:  Diagnosis Date   Anxiety    Fatigue    Hyperlipemia    Irregular menstrual cycle    Obesity     Patient Active Problem List   Diagnosis Date Noted   Gastroesophageal reflux disease without esophagitis 08/17/2020   Class 2 obesity due to excess calories with body mass index (BMI) of 36.0 to 36.9 in adult 08/17/2020   Psychophysiological insomnia 11/08/2019   OSA on CPAP 11/08/2019   Essential hypertension 02/11/2018   Anxiety 02/08/2016   Mixed hyperlipidemia 02/08/2016    Past Surgical History:  Procedure Laterality Date   BACK SURGERY     x 4    OB History     Gravida  2   Para  2   Term  2   Preterm      AB      Living  2      SAB      IAB      Ectopic      Multiple      Live Births  2            Home Medications    Prior to  Admission medications   Medication Sig Start Date End Date Taking? Authorizing Provider  atorvastatin (LIPITOR) 40 MG tablet Take 1 tablet (40 mg total) by mouth daily. 09/04/20  Yes Lorre Munroe, NP  FLUoxetine (PROZAC) 10 MG capsule Take 2 capsules (20 mg total) by mouth daily. 08/17/20  Yes Baity, Salvadore Oxford, NP  olmesartan (BENICAR) 20 MG tablet Take 1 tablet (20 mg total) by mouth daily. 10/09/20  Yes Lorre Munroe, NP  ondansetron (ZOFRAN-ODT) 4 MG disintegrating tablet Take 1 tablet (4 mg total) by mouth every 8 (eight) hours as needed for nausea or vomiting. 02/11/21  Yes Jerlyn Pain K, PA-C  oseltamivir (TAMIFLU) 75 MG capsule Take 1 capsule (75 mg total) by mouth every 12 (twelve) hours. 02/11/21  Yes Jaylee Lantry K, PA-C  promethazine-dextromethorphan (PROMETHAZINE-DM) 6.25-15 MG/5ML syrup Take 5 mLs by mouth 3 (three) times daily as needed for cough. 02/11/21  Yes Jilliam Bellmore K, PA-C  traZODone (DESYREL) 50 MG tablet Take 0.5-1 tablets (25-50 mg total) by mouth at bedtime as needed for sleep. 10/09/20  Yes Lorre Munroe, NP    Family History Family History  Problem  Relation Age of Onset   Heart disease Father    Hypertension Father    Thyroid disease Father    Heart attack Father 12   CAD Father    Kidney failure Father    Hypertension Mother    Heart failure Maternal Grandmother    Lung cancer Maternal Grandfather    Cancer Paternal Grandmother    Coronary artery disease Paternal Grandmother    Cancer Paternal Grandfather    Diabetes Neg Hx     Social History Social History   Tobacco Use   Smoking status: Every Day    Packs/day: 1.50    Years: 25.00    Pack years: 37.50    Types: Cigarettes    Start date: 03/23/1993   Smokeless tobacco: Never  Vaping Use   Vaping Use: Never used  Substance Use Topics   Alcohol use: No    Alcohol/week: 0.0 standard drinks   Drug use: No     Allergies   Patient has no known allergies.   Review of Systems Review of  Systems  Constitutional:  Positive for activity change, fatigue and fever. Negative for appetite change.  HENT:  Positive for congestion and sore throat. Negative for sinus pressure and sneezing.   Respiratory:  Positive for cough. Negative for shortness of breath.   Cardiovascular:  Negative for chest pain.  Gastrointestinal:  Positive for diarrhea and nausea. Negative for abdominal pain and vomiting.  Musculoskeletal:  Positive for arthralgias and myalgias.  Neurological:  Negative for dizziness, light-headedness and headaches.    Physical Exam Triage Vital Signs ED Triage Vitals  Enc Vitals Group     BP 02/11/21 0809 (!) 166/73     Pulse Rate 02/11/21 0809 100     Resp 02/11/21 0809 14     Temp 02/11/21 0809 98.5 F (36.9 C)     Temp Source 02/11/21 0809 Oral     SpO2 02/11/21 0809 97 %     Weight 02/11/21 0807 180 lb (81.6 kg)     Height 02/11/21 0807 5\' 1"  (1.549 m)     Head Circumference --      Peak Flow --      Pain Score 02/11/21 0806 8     Pain Loc --      Pain Edu? --      Excl. in GC? --    No data found.  Updated Vital Signs BP (!) 166/73 (BP Location: Left Arm) Comment: Patient states that hse has not taken her BP medicine today   Pulse 100    Temp 98.5 F (36.9 C) (Oral)    Resp 14    Ht 5\' 1"  (1.549 m)    Wt 180 lb (81.6 kg)    SpO2 97%    BMI 34.01 kg/m   Visual Acuity Right Eye Distance:   Left Eye Distance:   Bilateral Distance:    Right Eye Near:   Left Eye Near:    Bilateral Near:     Physical Exam Vitals reviewed.  Constitutional:      General: She is awake. She is not in acute distress.    Appearance: Normal appearance. She is well-developed. She is not ill-appearing.     Comments: Very pleasant female appears stated age no acute distress sitting comfortably in exam room  HENT:     Head: Normocephalic and atraumatic.     Right Ear: Tympanic membrane, ear canal and external ear normal. Tympanic membrane is not erythematous or bulging.  Left Ear: Tympanic membrane, ear canal and external ear normal. Tympanic membrane is not erythematous or bulging.     Nose:     Right Sinus: No maxillary sinus tenderness or frontal sinus tenderness.     Left Sinus: No maxillary sinus tenderness or frontal sinus tenderness.     Mouth/Throat:     Pharynx: Uvula midline. Posterior oropharyngeal erythema present. No oropharyngeal exudate.  Cardiovascular:     Rate and Rhythm: Normal rate and regular rhythm.     Heart sounds: Normal heart sounds, S1 normal and S2 normal. No murmur heard. Pulmonary:     Effort: Pulmonary effort is normal.     Breath sounds: Normal breath sounds. No wheezing, rhonchi or rales.     Comments: Clear to auscultation bilaterally Abdominal:     General: Bowel sounds are normal.     Palpations: Abdomen is soft.     Tenderness: There is no abdominal tenderness.  Psychiatric:        Behavior: Behavior is cooperative.     UC Treatments / Results  Labs (all labs ordered are listed, but only abnormal results are displayed) Labs Reviewed  RAPID INFLUENZA A&B ANTIGENS  SARS CORONAVIRUS 2 (TAT 6-24 HRS)    EKG   Radiology No results found.  Procedures Procedures (including critical care time)  Medications Ordered in UC Medications - No data to display  Initial Impression / Assessment and Plan / UC Course  I have reviewed the triage vital signs and the nursing notes.  Pertinent labs & imaging results that were available during my care of the patient were reviewed by me and considered in my medical decision making (see chart for details).     Blood pressure is elevated today.  Patient reports he is not taking her medication.  She was encouraged to go home and take her medicine.  She denies any signs or symptoms of endorgan damage.  Recommended she avoid salt, decongestants, caffeine.  She is to monitor blood pressure at home with remains elevated needs to be reevaluated.  Discussed that if she develops any  chest pain, shortness of breath, headache, dizziness, vision changes in the setting of high blood pressure she needs to go to the emergency room.   Patient is negative for influenza.  Believe this is a false negative given known contacts and constellation of symptoms.  We will send out for COVID testing but in the meantime start Tamiflu.  Patient was given Zofran for nausea symptoms with instruction to push fluids.  She was given Promethazine DM for cough with instruction not to drive or drink alcohol while taking this due to risk of sedation.  She is to use over-the-counter medications including Mucinex and Flonase.  Recommended she rest and drink plenty of fluid.  Discussed alarm symptoms that warrant emergent evaluation including chest pain, shortness of breath, nausea/vomiting interfering with oral intake, lethargy.  Strict return precautions given to which she expressed understanding.  Work excuse note provided.  Final Clinical Impressions(s) / UC Diagnoses   Final diagnoses:  Viral URI with cough  Acute cough  Body aches  Exposure to influenza  Elevated blood pressure reading in office with diagnosis of hypertension     Discharge Instructions      You tested negative for the flu but I am not convinced that you do not have it.  We are going to start Tamiflu given your constellation of symptoms and exposure.  I did send out for COVID test and if this  is positive he can stop Tamiflu.  Use Zofran every 8 hours for nausea.  Use Promethazine DM for cough.  Make sure you rest and drink plenty of fluid.  If you have any worsening symptoms including high fever, chest pain, shortness of breath, nausea/vomiting interfering with oral intake you need to go to the emergency room.     ED Prescriptions     Medication Sig Dispense Auth. Provider   ondansetron (ZOFRAN-ODT) 4 MG disintegrating tablet Take 1 tablet (4 mg total) by mouth every 8 (eight) hours as needed for nausea or vomiting. 20 tablet  Suhan Paci K, PA-C   oseltamivir (TAMIFLU) 75 MG capsule Take 1 capsule (75 mg total) by mouth every 12 (twelve) hours. 10 capsule Rayson Rando K, PA-C   promethazine-dextromethorphan (PROMETHAZINE-DM) 6.25-15 MG/5ML syrup Take 5 mLs by mouth 3 (three) times daily as needed for cough. 118 mL Jessikah Dicker K, PA-C      PDMP not reviewed this encounter.   Jeani Hawking, PA-C 02/11/21 [id number]

## 2021-02-11 NOTE — Discharge Instructions (Addendum)
You tested negative for the flu but I am not convinced that you do not have it.  We are going to start Tamiflu given your constellation of symptoms and exposure.  I did send out for COVID test and if this is positive he can stop Tamiflu.  Use Zofran every 8 hours for nausea.  Use Promethazine DM for cough.  Make sure you rest and drink plenty of fluid.  If you have any worsening symptoms including high fever, chest pain, shortness of breath, nausea/vomiting interfering with oral intake you need to go to the emergency room.

## 2021-02-11 NOTE — ED Triage Notes (Signed)
Patient c/o cough, congestion, and bodyaches that started yesterday. Patient states that her husband was diagnosed with the flu this past Friday.  Patient reports fevers.

## 2021-02-12 LAB — SARS CORONAVIRUS 2 (TAT 6-24 HRS): SARS Coronavirus 2: NEGATIVE

## 2021-03-15 ENCOUNTER — Ambulatory Visit: Payer: Managed Care, Other (non HMO) | Admitting: Internal Medicine

## 2021-03-15 ENCOUNTER — Encounter: Payer: Self-pay | Admitting: Internal Medicine

## 2021-03-15 ENCOUNTER — Other Ambulatory Visit: Payer: Self-pay

## 2021-03-15 VITALS — BP 177/66 | HR 68 | Temp 97.5°F | Resp 17 | Ht 61.0 in | Wt 191.2 lb

## 2021-03-15 DIAGNOSIS — I10 Essential (primary) hypertension: Secondary | ICD-10-CM

## 2021-03-15 DIAGNOSIS — R350 Frequency of micturition: Secondary | ICD-10-CM | POA: Diagnosis not present

## 2021-03-15 DIAGNOSIS — H9313 Tinnitus, bilateral: Secondary | ICD-10-CM | POA: Diagnosis not present

## 2021-03-15 DIAGNOSIS — R3915 Urgency of urination: Secondary | ICD-10-CM | POA: Diagnosis not present

## 2021-03-15 DIAGNOSIS — R3129 Other microscopic hematuria: Secondary | ICD-10-CM

## 2021-03-15 DIAGNOSIS — F5104 Psychophysiologic insomnia: Secondary | ICD-10-CM

## 2021-03-15 LAB — POCT URINALYSIS DIPSTICK
Bilirubin, UA: NEGATIVE
Glucose, UA: NEGATIVE
Ketones, UA: NEGATIVE
Leukocytes, UA: NEGATIVE
Nitrite, UA: NEGATIVE
Protein, UA: NEGATIVE
Spec Grav, UA: 1.01 (ref 1.010–1.025)
Urobilinogen, UA: 0.2 E.U./dL
pH, UA: 7 (ref 5.0–8.0)

## 2021-03-15 MED ORDER — OLMESARTAN MEDOXOMIL 40 MG PO TABS: 40.0000 mg | ORAL_TABLET | Freq: Every day | ORAL | 0 refills | Status: DC

## 2021-03-15 NOTE — Addendum Note (Signed)
Addended by: Lonna Cobb on: 03/15/2021 04:14 PM   Modules accepted: Orders

## 2021-03-15 NOTE — Progress Notes (Signed)
Subjective:    Patient ID: [name], female    DOB: [birth year], 56 y.o.   MRN: [id number]  HPI  Patient presents the clinic today with complaint of headache, ringing in her ears and nausea.  This started 1 month ago.  The headache is located in her forehead and temples.  She describes the pain as pressure and throbbing.  She denies visual changes but has had intermittent dizziness.  She denies runny nose, nasal congestion, ear pain, sore throat, cough or shortness of breath.  She denies vomiting, constipation or diarrhea.  She did have flulike symptoms 1 month ago, was seen at urgent care for same.  She tested negative for flu and COVID.  She was treated with Zofran, Tamiflu and Promethazine DM cough syrup.  Of note, her BP today is 177/66.  She is taking Olmesartan 20 mg as prescribed.  She also complains of urinary frequency and urgency.  She reports this started 1 week ago.  She denies pelvic pain, dysuria, burning with urination, blood in her urine, vaginal discharge, vaginal odor or irritation.  She has not taken anything OTC for symptoms.  She does consume caffeine.  She also reports she is having difficulty sleeping due to the ringing in her ears.  She is taking Trazodone at bedtime but it does not seem as effective as it once was.  Review of Systems  Past Medical History:  Diagnosis Date   Anxiety    Fatigue    Hyperlipemia    Irregular menstrual cycle    Obesity     Current Outpatient Medications  Medication Sig Dispense Refill   atorvastatin (LIPITOR) 40 MG tablet Take 1 tablet (40 mg total) by mouth daily. 30 tablet 2   FLUoxetine (PROZAC) 10 MG capsule Take 2 capsules (20 mg total) by mouth daily. 180 capsule 0   olmesartan (BENICAR) 20 MG tablet Take 1 tablet (20 mg total) by mouth daily. 90 tablet 3   ondansetron (ZOFRAN-ODT) 4 MG disintegrating tablet Take 1 tablet (4 mg total) by mouth every 8 (eight) hours as needed for nausea or vomiting. 20 tablet 0    oseltamivir (TAMIFLU) 75 MG capsule Take 1 capsule (75 mg total) by mouth every 12 (twelve) hours. 10 capsule 0   promethazine-dextromethorphan (PROMETHAZINE-DM) 6.25-15 MG/5ML syrup Take 5 mLs by mouth 3 (three) times daily as needed for cough. 118 mL 0   traZODone (DESYREL) 50 MG tablet Take 0.5-1 tablets (25-50 mg total) by mouth at bedtime as needed for sleep. 30 tablet 0   No current facility-administered medications for this visit.    No Known Allergies  Family History  Problem Relation Age of Onset   Heart disease Father    Hypertension Father    Thyroid disease Father    Heart attack Father 54   CAD Father    Kidney failure Father    Hypertension Mother    Heart failure Maternal Grandmother    Lung cancer Maternal Grandfather    Cancer Paternal Grandmother    Coronary artery disease Paternal Grandmother    Cancer Paternal Grandfather    Diabetes Neg Hx     Social History   Socioeconomic History   Marital status: Married    Spouse name: Not on file   Number of children: Not on file   Years of education: Not on file   Highest education level: High school graduate  Occupational History   Not on file  Tobacco Use   Smoking status: Every  Day    Packs/day: 1.50    Years: 25.00    Pack years: 37.50    Types: Cigarettes    Start date: 03/23/1993   Smokeless tobacco: Never  Vaping Use   Vaping Use: Never used  Substance and Sexual Activity   Alcohol use: No    Alcohol/week: 0.0 standard drinks   Drug use: No   Sexual activity: Yes    Birth control/protection: Post-menopausal  Other Topics Concern   Not on file  Social History Narrative   Not on file   Social Determinants of Health   Financial Resource Strain: Not on file  Food Insecurity: Not on file  Transportation Needs: Not on file  Physical Activity: Not on file  Stress: Not on file  Social Connections: Not on file  Intimate Partner Violence: Not on file     Constitutional: Patient reports  headache.  Denies fever, malaise, fatigue, or abrupt weight changes.  HEENT: Patient reports ringing in her ears.  Denies eye pain, eye redness, ear pain, wax buildup, runny nose, nasal congestion, bloody nose, or sore throat. Respiratory: Denies difficulty breathing, shortness of breath, cough or sputum production.   Cardiovascular: Denies chest pain, chest tightness, palpitations or swelling in the hands or feet.  Gastrointestinal: Patient reports nausea.  Denies abdominal pain, bloating, constipation, diarrhea or blood in the stool.  GU: Patient reports urgency and frequency.  Denies pain with urination, burning sensation, blood in urine, odor or discharge. Neurological: Patient reports intermittent dizziness, insomnia.  Denies difficulty with memory, difficulty with speech or problems with balance and coordination.    No other specific complaints in a complete review of systems (except as listed in HPI above).      Objective:   Physical Exam  BP (!) 177/66 (BP Location: Right Arm, Patient Position: Sitting, Cuff Size: Large)    Pulse 68    Temp (!) 97.5 F (36.4 C) (Temporal)    Resp 17    Ht 5\' 1"  (1.549 m)    Wt 191 lb 3.2 oz (86.7 kg)    SpO2 100%    BMI 36.13 kg/m   Wt Readings from Last 3 Encounters:  02/11/21 180 lb (81.6 kg)  11/27/20 191 lb (86.6 kg)  10/09/20 187 lb 6.4 oz (85 kg)    General: Appears her stated age, obese, in NAD. HEENT: Head: normal shape and size; Eyes: sclera white, PERRLA and EOMs intact; Ears: Tm's gray and intact, normal light reflex, + serous effusion on the right;  Cardiovascular: Normal rate and rhythm. S1,S2 noted.  No murmur, rubs or gallops noted. No JVD or BLE edema.  Pulmonary/Chest: Normal effort and positive vesicular breath sounds. No respiratory distress. No wheezes, rales or ronchi noted.  Abdomen: No CVA tenderness noted. Musculoskeletal:  No difficulty with gait.  Neurological: Alert and oriented. Coordination normal.    BMET     Component Value Date/Time   NA 139 09/01/2020 0753   NA 144 03/18/2018 0922   NA 140 06/16/2013 1919   K 4.1 09/01/2020 0753   K 3.5 06/16/2013 1919   CL 103 09/01/2020 0753   CL 107 06/16/2013 1919   CO2 26 09/01/2020 0753   CO2 27 06/16/2013 1919   GLUCOSE 89 09/01/2020 0753   GLUCOSE 127 (H) 06/16/2013 1919   BUN 12 09/01/2020 0753   BUN 11 03/18/2018 0922   BUN 13 06/16/2013 1919   CREATININE 0.77 09/01/2020 0753   CALCIUM 9.5 09/01/2020 0753   CALCIUM 8.7 06/16/2013  1919   GFRNONAA 88 09/01/2020 0753   GFRAA 102 09/01/2020 0753    Lipid Panel     Component Value Date/Time   CHOL 274 (H) 09/01/2020 0753   CHOL 300 (H) 01/02/2018 1030   TRIG 168 (H) 09/01/2020 0753   HDL 44 (L) 09/01/2020 0753   HDL 38 (L) 01/02/2018 1030   CHOLHDL 6.2 (H) 09/01/2020 0753   LDLCALC 196 (H) 09/01/2020 0753    CBC    Component Value Date/Time   WBC 12.0 (H) 09/01/2020 0753   RBC 4.69 09/01/2020 0753   HGB 13.8 09/01/2020 0753   HGB 14.9 03/13/2017 0843   HCT 42.3 09/01/2020 0753   HCT 44.7 03/13/2017 0843   PLT 259 09/01/2020 0753   PLT 291 03/13/2017 0843   MCV 90.2 09/01/2020 0753   MCV 91 03/13/2017 0843   MCV 88 06/16/2013 1919   MCH 29.4 09/01/2020 0753   MCHC 32.6 09/01/2020 0753   RDW 12.6 09/01/2020 0753   RDW 13.3 03/13/2017 0843   RDW 12.9 06/16/2013 1919   LYMPHSABS 2,594 05/26/2019 0908   LYMPHSABS 2.9 03/13/2017 0843   EOSABS 254 05/26/2019 0908   EOSABS 0.2 03/13/2017 0843   BASOSABS 85 05/26/2019 0908   BASOSABS 0.1 03/13/2017 0843    Hgb A1C Lab Results  Component Value Date   HGBA1C 4.8 09/01/2020            Assessment & Plan:   Tinnitus, HTN:  No evidence of infection Start Flonase 1 spray each nostril 2 times daily Increase Olmesartan to 40 mg daily Reinforced DASH diet and exercise for weight loss  Urinary Urgency and Frequency:  Urinalysis: Trace blood We will send urine culture Looking back she always has at least trace  blood in her urine Encouraged her to push water Avoid caffeine at this time  Insomnia:  Increase Trazodone to 75 to 100 mg at bedtime  RTC in 1 week for follow-up of tinnitus, HTN and blood in urine Nicki Reaper, NP This visit occurred during the SARS-CoV-2 public health emergency.  Safety protocols were in place, including screening questions prior to the visit, additional usage of staff PPE, and extensive cleaning of exam room while observing appropriate contact time as indicated for disinfecting solutions.

## 2021-03-15 NOTE — Patient Instructions (Signed)
Tinnitus Tinnitus refers to hearing a sound when there is no actual source for that sound. This is often described as ringing in the ears. However, people with this condition may hear a variety of noises, in one ear or in both ears. The sounds of tinnitus can be soft, loud, or somewhere in between. Tinnitus can last for a few seconds or can be constant for days. It may go away without treatment and come back at various times. When tinnitus is constant or happens often, it can lead to other problems, such as trouble sleeping and trouble concentrating. Almost everyone experiences tinnitus at some point. Tinnitus is not the same as hearing loss. Tinnitus that is long-lasting (chronic) or comes back often (recurs) may require medical attention. What are the causes? The cause of tinnitus is often not known. In some cases, it can result from: Exposure to loud noises from machinery, music, or other sources. An object (foreign body) stuck in the ear. Earwax buildup. Drinking alcohol or caffeine. Taking certain medicines. Age-related hearing loss. It may also be caused by medical conditions such as: Ear or sinus infections. Heart diseases or high blood pressure. Allergies. Mnire's disease. Thyroid problems. Tumors. A weak, bulging blood vessel (aneurysm) near the ear. What increases the risk? The following factors may make you more likely to develop this condition: Exposure to loud noises. Age. Tinnitus is more likely in older individuals. Using alcohol or tobacco. What are the signs or symptoms? The main symptom of tinnitus is hearing a sound when there is no source for that sound. It may sound like: Buzzing. Sizzling. Ringing. Blowing air. Hissing. Whistling. Other sounds may include: Roaring. Running water. A musical note. Tapping. Humming. Symptoms may affect only one ear (unilateral) or both ears (bilateral). How is this diagnosed? Tinnitus is diagnosed based on your symptoms,  your medical history, and a physical exam. Your health care provider may do a thorough hearing test (audiologic exam) if your tinnitus: Is unilateral. Causes hearing difficulties. Lasts 6 months or longer. You may work with a health care provider who specializes in hearing disorders (audiologist). You may be asked questions about your symptoms and how they affect your daily life. You may have other tests done, such as: CT scan. MRI. An imaging test of how blood flows through your blood vessels (angiogram). How is this treated? Treating an underlying medical condition can sometimes make tinnitus go away. If your tinnitus continues, other treatments may include: Therapy and counseling to help you manage the stress of living with tinnitus. Sound generators to mask the tinnitus. These include: Tabletop sound machines that play relaxing sounds to help you fall asleep. Wearable devices that fit in your ear and play sounds or music. Acoustic neural stimulation. This involves using headphones to listen to music that contains an auditory signal. Over time, listening to this signal may change some pathways in your brain and make you less sensitive to tinnitus. This treatment is used for very severe cases when no other treatment is working. Using hearing aids or cochlear implants if your tinnitus is related to hearing loss. Hearing aids are worn in the outer ear. Cochlear implants are surgically placed in the inner ear. Follow these instructions at home: Managing symptoms   When possible, avoid being in loud places and being exposed to loud sounds. Wear hearing protection, such as earplugs, when you are exposed to loud noises. Use a white noise machine, a humidifier, or other devices to mask the sound of tinnitus. Practice techniques   for reducing stress, such as meditation, yoga, or deep breathing. Work with your health care provider if you need help with managing stress. Sleep with your head slightly  raised. This may reduce the impact of tinnitus. General instructions Do not use stimulants, such as nicotine, alcohol, or caffeine. Talk with your health care provider about other stimulants to avoid. Stimulants are substances that can make you feel alert and attentive by increasing certain activities in the body (such as heart rate and blood pressure). These substances may make tinnitus worse. Take over-the-counter and prescription medicines only as told by your health care provider. Try to get plenty of sleep each night. Keep all follow-up visits. This is important. Contact a health care provider if: Your tinnitus continues for 3 weeks or longer without stopping. You develop sudden hearing loss. Your symptoms get worse or do not get better with home care. You feel you are not able to manage the stress of living with tinnitus. Get help right away if: You develop tinnitus after a head injury. You have tinnitus along with any of the following: Dizziness. Nausea and vomiting. Loss of balance. Sudden, severe headache. Vision changes. Facial weakness or weakness of arms or legs. These symptoms may represent a serious problem that is an emergency. Do not wait to see if the symptoms will go away. Get medical help right away. Call your local emergency services (911 in the [place] Do not drive yourself to the hospital. Summary Tinnitus refers to hearing a sound when there is no actual source for that sound. This is often described as ringing in the ears. Symptoms may affect only one ear (unilateral) or both ears (bilateral). Use a white noise machine, a humidifier, or other devices to mask the sound of tinnitus. Do not use stimulants, such as nicotine, alcohol, or caffeine. These substances may make tinnitus worse. This information is not intended to replace advice given to you by your health care provider. Make sure you discuss any questions you have with your health care provider. Document  Revised: 01/17/2020 Document Reviewed: 01/17/2020 Elsevier Patient Education  2022 Elsevier Inc.  

## 2021-03-17 LAB — URINE CULTURE
MICRO NUMBER:: 12896820
Result:: NO GROWTH
SPECIMEN QUALITY:: ADEQUATE

## 2021-03-21 ENCOUNTER — Encounter: Payer: Self-pay | Admitting: Internal Medicine

## 2021-03-22 ENCOUNTER — Ambulatory Visit: Payer: Managed Care, Other (non HMO) | Admitting: Internal Medicine

## 2021-03-29 ENCOUNTER — Encounter: Payer: Self-pay | Admitting: Internal Medicine

## 2021-03-29 ENCOUNTER — Other Ambulatory Visit: Payer: Self-pay

## 2021-03-29 ENCOUNTER — Ambulatory Visit: Payer: Managed Care, Other (non HMO) | Admitting: Internal Medicine

## 2021-03-29 VITALS — BP 177/77 | HR 67 | Temp 97.3°F | Wt 190.0 lb

## 2021-03-29 DIAGNOSIS — I10 Essential (primary) hypertension: Secondary | ICD-10-CM

## 2021-03-29 DIAGNOSIS — H9313 Tinnitus, bilateral: Secondary | ICD-10-CM

## 2021-03-29 DIAGNOSIS — Z6835 Body mass index (BMI) 35.0-35.9, adult: Secondary | ICD-10-CM

## 2021-03-29 MED ORDER — AMLODIPINE BESYLATE 10 MG PO TABS: 10.0000 mg | ORAL_TABLET | Freq: Every day | ORAL | 0 refills | Status: DC

## 2021-03-29 NOTE — Patient Instructions (Signed)
Tinnitus Tinnitus refers to hearing a sound when there is no actual source for that sound. This is often described as ringing in the ears. However, people with this condition may hear a variety of noises, in one ear or in both ears. The sounds of tinnitus can be soft, loud, or somewhere in between. Tinnitus can last for a few seconds or can be constant for days. It may go away without treatment and come back at various times. When tinnitus is constant or happens often, it can lead to other problems, such as trouble sleeping and trouble concentrating. Almost everyone experiences tinnitus at some point. Tinnitus is not the same as hearing loss. Tinnitus that is long-lasting (chronic) or comes back often (recurs) may require medical attention. What are the causes? The cause of tinnitus is often not known. In some cases, it can result from: Exposure to loud noises from machinery, music, or other sources. An object (foreign body) stuck in the ear. Earwax buildup. Drinking alcohol or caffeine. Taking certain medicines. Age-related hearing loss. It may also be caused by medical conditions such as: Ear or sinus infections. Heart diseases or high blood pressure. Allergies. Mnire's disease. Thyroid problems. Tumors. A weak, bulging blood vessel (aneurysm) near the ear. What increases the risk? The following factors may make you more likely to develop this condition: Exposure to loud noises. Age. Tinnitus is more likely in older individuals. Using alcohol or tobacco. What are the signs or symptoms? The main symptom of tinnitus is hearing a sound when there is no source for that sound. It may sound like: Buzzing. Sizzling. Ringing. Blowing air. Hissing. Whistling. Other sounds may include: Roaring. Running water. A musical note. Tapping. Humming. Symptoms may affect only one ear (unilateral) or both ears (bilateral). How is this diagnosed? Tinnitus is diagnosed based on your symptoms,  your medical history, and a physical exam. Your health care provider may do a thorough hearing test (audiologic exam) if your tinnitus: Is unilateral. Causes hearing difficulties. Lasts 6 months or longer. You may work with a health care provider who specializes in hearing disorders (audiologist). You may be asked questions about your symptoms and how they affect your daily life. You may have other tests done, such as: CT scan. MRI. An imaging test of how blood flows through your blood vessels (angiogram). How is this treated? Treating an underlying medical condition can sometimes make tinnitus go away. If your tinnitus continues, other treatments may include: Therapy and counseling to help you manage the stress of living with tinnitus. Sound generators to mask the tinnitus. These include: Tabletop sound machines that play relaxing sounds to help you fall asleep. Wearable devices that fit in your ear and play sounds or music. Acoustic neural stimulation. This involves using headphones to listen to music that contains an auditory signal. Over time, listening to this signal may change some pathways in your brain and make you less sensitive to tinnitus. This treatment is used for very severe cases when no other treatment is working. Using hearing aids or cochlear implants if your tinnitus is related to hearing loss. Hearing aids are worn in the outer ear. Cochlear implants are surgically placed in the inner ear. Follow these instructions at home: Managing symptoms   When possible, avoid being in loud places and being exposed to loud sounds. Wear hearing protection, such as earplugs, when you are exposed to loud noises. Use a white noise machine, a humidifier, or other devices to mask the sound of tinnitus. Practice techniques   for reducing stress, such as meditation, yoga, or deep breathing. Work with your health care provider if you need help with managing stress. Sleep with your head slightly  raised. This may reduce the impact of tinnitus. General instructions Do not use stimulants, such as nicotine, alcohol, or caffeine. Talk with your health care provider about other stimulants to avoid. Stimulants are substances that can make you feel alert and attentive by increasing certain activities in the body (such as heart rate and blood pressure). These substances may make tinnitus worse. Take over-the-counter and prescription medicines only as told by your health care provider. Try to get plenty of sleep each night. Keep all follow-up visits. This is important. Contact a health care provider if: Your tinnitus continues for 3 weeks or longer without stopping. You develop sudden hearing loss. Your symptoms get worse or do not get better with home care. You feel you are not able to manage the stress of living with tinnitus. Get help right away if: You develop tinnitus after a head injury. You have tinnitus along with any of the following: Dizziness. Nausea and vomiting. Loss of balance. Sudden, severe headache. Vision changes. Facial weakness or weakness of arms or legs. These symptoms may represent a serious problem that is an emergency. Do not wait to see if the symptoms will go away. Get medical help right away. Call your local emergency services (911 in the [place] Do not drive yourself to the hospital. Summary Tinnitus refers to hearing a sound when there is no actual source for that sound. This is often described as ringing in the ears. Symptoms may affect only one ear (unilateral) or both ears (bilateral). Use a white noise machine, a humidifier, or other devices to mask the sound of tinnitus. Do not use stimulants, such as nicotine, alcohol, or caffeine. These substances may make tinnitus worse. This information is not intended to replace advice given to you by your health care provider. Make sure you discuss any questions you have with your health care provider. Document  Revised: 01/17/2020 Document Reviewed: 01/17/2020 Elsevier Patient Education  2022 Elsevier Inc.  

## 2021-03-29 NOTE — Progress Notes (Signed)
Subjective:    Patient ID: [name], female    DOB: 1965-04-02, 56 y.o.   MRN: [id number]  HPI  Patient presents to clinic today for follow-up of tinnitus.  She was seen 1/23 by me for the same.  Her blood pressure was elevated at that time so we increased her Olmesartan to 40 mg daily.  She was also started on Flonase for symptom management. She also tried Ring Ear Relief OTC with minimal relief of symptoms. She tested positive for COVID on 1/24.  She reports her COVID symptoms have resolved but she continues to have the ringing in the ears.  She denies hearing loss but reports when she lays on her left side she does feel dizzy at times.  She reports she had to stop taking the olmesartan due to dizziness, nausea and headache. The headache is located in her foreheard. She describes the pain as throbbing. She denies vision changes, sensitivity to light and sound.  She reports she did have the symptoms on the 20 mg but they worsened with the increased dose.  She stopped Olmesartan and symptoms have resolved. Her BP toay is 177/77.  Review of Systems     Past Medical History:  Diagnosis Date   Anxiety    Fatigue    Hyperlipemia    Irregular menstrual cycle    Obesity     Current Outpatient Medications  Medication Sig Dispense Refill   atorvastatin (LIPITOR) 40 MG tablet Take 1 tablet (40 mg total) by mouth daily. 30 tablet 2   FLUoxetine (PROZAC) 10 MG capsule Take 2 capsules (20 mg total) by mouth daily. 180 capsule 0   olmesartan (BENICAR) 40 MG tablet Take 1 tablet (40 mg total) by mouth daily. 30 tablet 0   traZODone (DESYREL) 50 MG tablet Take 0.5-1 tablets (25-50 mg total) by mouth at bedtime as needed for sleep. (Patient not taking: Reported on 03/15/2021) 30 tablet 0   No current facility-administered medications for this visit.    No Known Allergies  Family History  Problem Relation Age of Onset   Heart disease Father    Hypertension Father    Thyroid disease Father     Heart attack Father 68   CAD Father    Kidney failure Father    Hypertension Mother    Heart failure Maternal Grandmother    Lung cancer Maternal Grandfather    Cancer Paternal Grandmother    Coronary artery disease Paternal Grandmother    Cancer Paternal Grandfather    Diabetes Neg Hx     Social History   Socioeconomic History   Marital status: Married    Spouse name: Not on file   Number of children: Not on file   Years of education: Not on file   Highest education level: High school graduate  Occupational History   Not on file  Tobacco Use   Smoking status: Every Day    Packs/day: 1.50    Years: 25.00    Pack years: 37.50    Types: Cigarettes    Start date: 03/23/1993   Smokeless tobacco: Never  Vaping Use   Vaping Use: Never used  Substance and Sexual Activity   Alcohol use: No    Alcohol/week: 0.0 standard drinks   Drug use: No   Sexual activity: Yes    Birth control/protection: Post-menopausal  Other Topics Concern   Not on file  Social History Narrative   Not on file   Social Determinants of Health  Financial Resource Strain: Not on file  Food Insecurity: Not on file  Transportation Needs: Not on file  Physical Activity: Not on file  Stress: Not on file  Social Connections: Not on file  Intimate Partner Violence: Not on file     Constitutional: Denies fever, malaise, fatigue, headache or abrupt weight changes.  HEENT: Patient reports ringing in her ears.  Denies eye pain, eye redness, ear pain, wax buildup, runny nose, nasal congestion, bloody nose, or sore throat. Respiratory: Denies difficulty breathing, shortness of breath, cough or sputum production.   Cardiovascular: Denies chest pain, chest tightness, palpitations or swelling in the hands or feet.  Neurological: Patient reports intermittent dizziness.  Denies difficulty with memory, difficulty with speech or problems with balance and coordination.  Psych: Denies anxiety, depression,  SI/HI.  No other specific complaints in a complete review of systems (except as listed in HPI above).  Objective:   Physical Exam BP (!) 177/77 (BP Location: Right Arm, Patient Position: Sitting, Cuff Size: Large)    Pulse 67    Temp (!) 97.3 F (36.3 C) (Temporal)    Wt 190 lb (86.2 kg)    SpO2 100%    BMI 35.90 kg/m   Wt Readings from Last 3 Encounters:  03/15/21 191 lb 3.2 oz (86.7 kg)  02/11/21 180 lb (81.6 kg)  11/27/20 191 lb (86.6 kg)    General: Appears her stated age, obese, in NAD. HEENT: Head: normal shape and size; Eyes: EOMs intact; Ears: Tm's gray and intact, scarring of the TM's noted, L>R; Normal Weber. Normal Rinne. Neck:  Neck supple, trachea midline. No masses, lumps or thyromegaly present.  Cardiovascular: Normal rate and rhythm. S1,S2 noted.  No murmur, rubs or gallops noted.  Pulmonary/Chest: Normal effort and positive vesicular breath sounds. No respiratory distress. No wheezes, rales or ronchi noted.  Musculoskeletal: No difficulty with gait.  Neurological: Alert and oriented. Coordination normal.    BMET    Component Value Date/Time   NA 139 09/01/2020 0753   NA 144 03/18/2018 0922   NA 140 06/16/2013 1919   K 4.1 09/01/2020 0753   K 3.5 06/16/2013 1919   CL 103 09/01/2020 0753   CL 107 06/16/2013 1919   CO2 26 09/01/2020 0753   CO2 27 06/16/2013 1919   GLUCOSE 89 09/01/2020 0753   GLUCOSE 127 (H) 06/16/2013 1919   BUN 12 09/01/2020 0753   BUN 11 03/18/2018 0922   BUN 13 06/16/2013 1919   CREATININE 0.77 09/01/2020 0753   CALCIUM 9.5 09/01/2020 0753   CALCIUM 8.7 06/16/2013 1919   GFRNONAA 88 09/01/2020 0753   GFRAA 102 09/01/2020 0753    Lipid Panel     Component Value Date/Time   CHOL 274 (H) 09/01/2020 0753   CHOL 300 (H) 01/02/2018 1030   TRIG 168 (H) 09/01/2020 0753   HDL 44 (L) 09/01/2020 0753   HDL 38 (L) 01/02/2018 1030   CHOLHDL 6.2 (H) 09/01/2020 0753   LDLCALC 196 (H) 09/01/2020 0753    CBC    Component Value Date/Time    WBC 12.0 (H) 09/01/2020 0753   RBC 4.69 09/01/2020 0753   HGB 13.8 09/01/2020 0753   HGB 14.9 03/13/2017 0843   HCT 42.3 09/01/2020 0753   HCT 44.7 03/13/2017 0843   PLT 259 09/01/2020 0753   PLT 291 03/13/2017 0843   MCV 90.2 09/01/2020 0753   MCV 91 03/13/2017 0843   MCV 88 06/16/2013 1919   MCH 29.4 09/01/2020 0753  MCHC 32.6 09/01/2020 0753   RDW 12.6 09/01/2020 0753   RDW 13.3 03/13/2017 0843   RDW 12.9 06/16/2013 1919   LYMPHSABS 2,594 05/26/2019 0908   LYMPHSABS 2.9 03/13/2017 0843   EOSABS 254 05/26/2019 0908   EOSABS 0.2 03/13/2017 0843   BASOSABS 85 05/26/2019 0908   BASOSABS 0.1 03/13/2017 0843    Hgb A1C Lab Results  Component Value Date   HGBA1C 4.8 09/01/2020           Assessment & Plan:   Tinnitus:  She does not appear to be on any medications causing tinnitus No known trauma Hearing exam essentially normal Hearing Screening   500Hz  1000Hz  2000Hz  4000Hz   Right ear 20 20 20 20   Left ear 25 40 20 20  Flonase not effective Referral to ENT for further evaluation of symptoms  HTN:  Uncontrolled off meds Failed Lisinopril and Olmesartan Will try Amlodipine 10 mg daily Reinforced DASH diet and exercise for weight loss  RTC in 10 days for BP check Nicki Reaperegina Amily Depp, NP This visit occurred during the SARS-CoV-2 public health emergency.  Safety protocols were in place, including screening questions prior to the visit, additional usage of staff PPE, and extensive cleaning of exam room while observing appropriate contact time as indicated for disinfecting solutions.

## 2021-04-12 ENCOUNTER — Encounter: Payer: Self-pay | Admitting: Internal Medicine

## 2021-04-12 ENCOUNTER — Ambulatory Visit: Payer: Managed Care, Other (non HMO) | Admitting: Internal Medicine

## 2021-04-12 ENCOUNTER — Other Ambulatory Visit: Payer: Self-pay

## 2021-04-12 VITALS — BP 134/72 | HR 72 | Temp 97.1°F | Wt 190.0 lb

## 2021-04-12 DIAGNOSIS — I10 Essential (primary) hypertension: Secondary | ICD-10-CM

## 2021-04-12 DIAGNOSIS — Z6835 Body mass index (BMI) 35.0-35.9, adult: Secondary | ICD-10-CM

## 2021-04-12 MED ORDER — ATORVASTATIN CALCIUM 40 MG PO TABS: 40.0000 mg | ORAL_TABLET | Freq: Every day | ORAL | 5 refills | Status: DC

## 2021-04-12 MED ORDER — AMLODIPINE BESYLATE 10 MG PO TABS: 10.0000 mg | ORAL_TABLET | Freq: Every day | ORAL | 5 refills | Status: DC

## 2021-04-12 NOTE — Patient Instructions (Signed)

## 2021-04-12 NOTE — Assessment & Plan Note (Signed)
Now controlled on Amlodipine Reinforced DASH diet and exercise for weight loss Will monitor

## 2021-04-12 NOTE — Progress Notes (Signed)
Subjective:    Patient ID: [name], female    DOB: 1965/03/30, 56 y.o.   MRN: [id number]  HPI  Patient presents to clinic today for 2-week follow-up of HTN.  At her last visit her BP was elevated because she was unable to take Olmesartan secondary to side effects.  She was switched to Amlodipine 10 mg daily.  She has been taking the medication as prescribed.  She denies adverse side effects at this time.  Her BP today is 134/72.  ECG from 05/2016 reviewed.  Review of Systems     Past Medical History:  Diagnosis Date   Anxiety    Fatigue    Hyperlipemia    Irregular menstrual cycle    Obesity     Current Outpatient Medications  Medication Sig Dispense Refill   amLODipine (NORVASC) 10 MG tablet Take 1 tablet (10 mg total) by mouth daily. 30 tablet 0   atorvastatin (LIPITOR) 40 MG tablet Take 1 tablet (40 mg total) by mouth daily. 30 tablet 2   FLUoxetine (PROZAC) 10 MG capsule Take 2 capsules (20 mg total) by mouth daily. 180 capsule 0   No current facility-administered medications for this visit.    No Known Allergies  Family History  Problem Relation Age of Onset   Heart disease Father    Hypertension Father    Thyroid disease Father    Heart attack Father 14   CAD Father    Kidney failure Father    Hypertension Mother    Heart failure Maternal Grandmother    Lung cancer Maternal Grandfather    Cancer Paternal Grandmother    Coronary artery disease Paternal Grandmother    Cancer Paternal Grandfather    Diabetes Neg Hx     Social History   Socioeconomic History   Marital status: Married    Spouse name: Not on file   Number of children: Not on file   Years of education: Not on file   Highest education level: High school graduate  Occupational History   Not on file  Tobacco Use   Smoking status: Every Day    Packs/day: 1.50    Years: 25.00    Pack years: 37.50    Types: Cigarettes    Start date: 03/23/1993   Smokeless tobacco: Never  Vaping Use    Vaping Use: Never used  Substance and Sexual Activity   Alcohol use: No    Alcohol/week: 0.0 standard drinks   Drug use: No   Sexual activity: Yes    Birth control/protection: Post-menopausal  Other Topics Concern   Not on file  Social History Narrative   Not on file   Social Determinants of Health   Financial Resource Strain: Not on file  Food Insecurity: Not on file  Transportation Needs: Not on file  Physical Activity: Not on file  Stress: Not on file  Social Connections: Not on file  Intimate Partner Violence: Not on file     Constitutional: Denies fever, malaise, fatigue, headache or abrupt weight changes.  HEENT: Pt reports ringing in the ears. Denies eye pain, eye redness, ear pain, wax buildup, runny nose, nasal congestion, bloody nose, or sore throat. Respiratory: Denies difficulty breathing, shortness of breath, cough or sputum production.   Cardiovascular: Patient reports intermittent swelling in the legs.  Denies chest pain, chest tightness, palpitations or swelling in the hands.  Neurological: Denies dizziness, difficulty with memory, difficulty with speech or problems with balance and coordination.    No other  specific complaints in a complete review of systems (except as listed in HPI above).  Objective:   Physical Exam BP 134/72 (BP Location: Right Arm, Patient Position: Sitting, Cuff Size: Large)    Pulse 72    Temp (!) 97.1 F (36.2 C) (Temporal)    Wt 190 lb (86.2 kg)    SpO2 100%    BMI 35.90 kg/m   Wt Readings from Last 3 Encounters:  03/29/21 190 lb (86.2 kg)  03/15/21 191 lb 3.2 oz (86.7 kg)  02/11/21 180 lb (81.6 kg)    General: Appears her stated age, obese, in NAD. HEENT: Head: normal shape and size; Eyes: EOMs intact;  Cardiovascular: Normal rate and rhythm. S1,S2 noted.  No murmur, rubs or gallops noted.  Trace BLE edema. No carotid bruits noted. Pulmonary/Chest: Normal effort and positive vesicular breath sounds. No respiratory distress. No  wheezes, rales or ronchi noted.  Musculoskeletal: No difficulty with gait.  Neurological: Alert and oriented.    BMET    Component Value Date/Time   NA 139 09/01/2020 0753   NA 144 03/18/2018 0922   NA 140 06/16/2013 1919   K 4.1 09/01/2020 0753   K 3.5 06/16/2013 1919   CL 103 09/01/2020 0753   CL 107 06/16/2013 1919   CO2 26 09/01/2020 0753   CO2 27 06/16/2013 1919   GLUCOSE 89 09/01/2020 0753   GLUCOSE 127 (H) 06/16/2013 1919   BUN 12 09/01/2020 0753   BUN 11 03/18/2018 0922   BUN 13 06/16/2013 1919   CREATININE 0.77 09/01/2020 0753   CALCIUM 9.5 09/01/2020 0753   CALCIUM 8.7 06/16/2013 1919   GFRNONAA 88 09/01/2020 0753   GFRAA 102 09/01/2020 0753    Lipid Panel     Component Value Date/Time   CHOL 274 (H) 09/01/2020 0753   CHOL 300 (H) 01/02/2018 1030   TRIG 168 (H) 09/01/2020 0753   HDL 44 (L) 09/01/2020 0753   HDL 38 (L) 01/02/2018 1030   CHOLHDL 6.2 (H) 09/01/2020 0753   LDLCALC 196 (H) 09/01/2020 0753    CBC    Component Value Date/Time   WBC 12.0 (H) 09/01/2020 0753   RBC 4.69 09/01/2020 0753   HGB 13.8 09/01/2020 0753   HGB 14.9 03/13/2017 0843   HCT 42.3 09/01/2020 0753   HCT 44.7 03/13/2017 0843   PLT 259 09/01/2020 0753   PLT 291 03/13/2017 0843   MCV 90.2 09/01/2020 0753   MCV 91 03/13/2017 0843   MCV 88 06/16/2013 1919   MCH 29.4 09/01/2020 0753   MCHC 32.6 09/01/2020 0753   RDW 12.6 09/01/2020 0753   RDW 13.3 03/13/2017 0843   RDW 12.9 06/16/2013 1919   LYMPHSABS 2,594 05/26/2019 0908   LYMPHSABS 2.9 03/13/2017 0843   EOSABS 254 05/26/2019 0908   EOSABS 0.2 03/13/2017 0843   BASOSABS 85 05/26/2019 0908   BASOSABS 0.1 03/13/2017 0843    Hgb A1C Lab Results  Component Value Date   HGBA1C 4.8 09/01/2020            Assessment & Plan:    Nicki Reaper, NP This visit occurred during the SARS-CoV-2 public health emergency.  Safety protocols were in place, including screening questions prior to the visit, additional usage of  staff PPE, and extensive cleaning of exam room while observing appropriate contact time as indicated for disinfecting solutions.

## 2021-04-12 NOTE — Assessment & Plan Note (Signed)
Encouraged diet and exercise for weight loss ?

## 2021-04-17 ENCOUNTER — Ambulatory Visit
Admission: RE | Admit: 2021-04-17 | Discharge: 2021-04-17 | Disposition: A | Discharge status: Home / Self Care | Payer: Managed Care, Other (non HMO) | Source: Ambulatory Visit | Attending: Internal Medicine | Admitting: Internal Medicine

## 2021-04-17 ENCOUNTER — Other Ambulatory Visit: Payer: Self-pay | Admitting: Internal Medicine

## 2021-04-17 ENCOUNTER — Other Ambulatory Visit: Payer: Self-pay

## 2021-04-17 DIAGNOSIS — N6489 Other specified disorders of breast: Secondary | ICD-10-CM

## 2021-04-17 DIAGNOSIS — Z1231 Encounter for screening mammogram for malignant neoplasm of breast: Secondary | ICD-10-CM | POA: Insufficient documentation

## 2021-04-17 DIAGNOSIS — R928 Other abnormal and inconclusive findings on diagnostic imaging of breast: Secondary | ICD-10-CM

## 2021-04-19 ENCOUNTER — Encounter: Payer: Self-pay | Admitting: Internal Medicine

## 2021-05-02 ENCOUNTER — Ambulatory Visit
Admission: RE | Admit: 2021-05-02 | Discharge: 2021-05-02 | Disposition: A | Discharge status: Home / Self Care | Payer: Managed Care, Other (non HMO) | Source: Ambulatory Visit | Attending: Internal Medicine | Admitting: Internal Medicine

## 2021-05-02 ENCOUNTER — Other Ambulatory Visit: Payer: Self-pay

## 2021-05-02 DIAGNOSIS — R928 Other abnormal and inconclusive findings on diagnostic imaging of breast: Secondary | ICD-10-CM | POA: Diagnosis not present

## 2021-05-02 DIAGNOSIS — N6489 Other specified disorders of breast: Secondary | ICD-10-CM

## 2021-08-08 ENCOUNTER — Other Ambulatory Visit: Payer: Self-pay | Admitting: Internal Medicine

## 2021-08-08 NOTE — Telephone Encounter (Signed)
Requested Prescriptions  Pending Prescriptions Disp Refills  . atorvastatin (LIPITOR) 40 MG tablet [Pharmacy Med Name: ATORVASTATIN 40 MG TABLET] 90 tablet 1    Sig: TAKE 1 TABLET BY MOUTH EVERY DAY     Cardiovascular:  Antilipid - Statins Failed - 08/08/2021  8:32 AM      Failed - Lipid Panel in normal range within the last 12 months    Cholesterol, Total  Date Value Ref Range Status  01/02/2018 300 (H) 100 - 199 mg/dL Final   Cholesterol  Date Value Ref Range Status  09/01/2020 274 (H) <200 mg/dL Final   LDL Cholesterol (Calc)  Date Value Ref Range Status  09/01/2020 196 (H) mg/dL (calc) Final    Comment:    LDL-C levels > or = 190 mg/dL may indicate familial  hypercholesterolemia (FH). Clinical assessment and  measurement of blood lipid levels should be  considered for all first degree relatives of  patients with an FH diagnosis.  For questions about testing for familial hypercholesterolemia, please call Engineer, materials at [email or web address]. Wardell Honour, et al. J National Lipid Association  Recommendations for Patient-Centered Management of  Dyslipidemia: Part 1 Journal of Clinical Lipidology  2015;9(2), 129-169. Reference range: <100 . Desirable range <100 mg/dL for primary prevention;   <70 mg/dL for patients with CHD or diabetic patients  with > or = 2 CHD risk factors. Marland Kitchen LDL-C is now calculated using the Martin-Hopkins  calculation, which is a validated novel method providing  better accuracy than the Friedewald equation in the  estimation of LDL-C.  Horald Pollen et al. Lenox Ahr. 3007;622(63): 2061-2068  (http://education.[employer].com/faq/FAQ164)    HDL  Date Value Ref Range Status  09/01/2020 44 (L) > OR = 50 mg/dL Final  33/54/5625 38 (L) >39 mg/dL Final   Triglycerides  Date Value Ref Range Status  09/01/2020 168 (H) <150 mg/dL Final         Passed - Patient is not pregnant      Passed - Valid encounter within last 12 months    Recent  Outpatient Visits          3 months ago Essential hypertension   [facility] [place], Salvadore Oxford, NP   4 months ago Tinnitus of both ears   [facility] [place], Salvadore Oxford, NP   4 months ago Tinnitus of both ears   [facility] [place], Salvadore Oxford, NP   8 months ago Flank pain   [facility] [place], Salvadore Oxford, NP   10 months ago Psychophysiological insomnia   [facility] [place], Salvadore Oxford, NP      Future Appointments            In 2 weeks Sampson Si, Salvadore Oxford, NP [facility], [facility]

## 2021-08-22 ENCOUNTER — Ambulatory Visit (INDEPENDENT_AMBULATORY_CARE_PROVIDER_SITE_OTHER): Payer: Managed Care, Other (non HMO) | Admitting: Internal Medicine

## 2021-08-22 ENCOUNTER — Encounter: Payer: Self-pay | Admitting: Internal Medicine

## 2021-08-22 VITALS — BP 152/74 | HR 73 | Temp 96.9°F | Ht 61.0 in | Wt 188.0 lb

## 2021-08-22 DIAGNOSIS — F5104 Psychophysiologic insomnia: Secondary | ICD-10-CM

## 2021-08-22 DIAGNOSIS — E782 Mixed hyperlipidemia: Secondary | ICD-10-CM

## 2021-08-22 DIAGNOSIS — I10 Essential (primary) hypertension: Secondary | ICD-10-CM

## 2021-08-22 DIAGNOSIS — Z6835 Body mass index (BMI) 35.0-35.9, adult: Secondary | ICD-10-CM

## 2021-08-22 DIAGNOSIS — Z0001 Encounter for general adult medical examination with abnormal findings: Secondary | ICD-10-CM | POA: Diagnosis not present

## 2021-08-22 DIAGNOSIS — Z1211 Encounter for screening for malignant neoplasm of colon: Secondary | ICD-10-CM | POA: Diagnosis not present

## 2021-08-22 LAB — COMPLETE METABOLIC PANEL WITH GFR
AG Ratio: 2.2 (calc) (ref 1.0–2.5)
ALT: 18 U/L (ref 6–29)
AST: 18 U/L (ref 10–35)
Albumin: 4.3 g/dL (ref 3.6–5.1)
Alkaline phosphatase (APISO): 107 U/L (ref 37–153)
BUN: 11 mg/dL (ref 7–25)
CO2: 26 mmol/L (ref 20–32)
Calcium: 9.2 mg/dL (ref 8.6–10.4)
Chloride: 106 mmol/L (ref 98–110)
Creat: 0.87 mg/dL (ref 0.50–1.03)
Globulin: 2 g/dL (calc) (ref 1.9–3.7)
Glucose, Bld: 81 mg/dL (ref 65–139)
Potassium: 3.8 mmol/L (ref 3.5–5.3)
Sodium: 139 mmol/L (ref 135–146)
Total Bilirubin: 0.4 mg/dL (ref 0.2–1.2)
Total Protein: 6.3 g/dL (ref 6.1–8.1)
eGFR: 79 mL/min/{1.73_m2} (ref >=60)

## 2021-08-22 LAB — CBC
HCT: 42.9 % (ref 35.0–45.0)
Hemoglobin: 14.4 g/dL (ref 11.7–15.5)
MCH: 29.5 pg (ref 27.0–33.0)
MCHC: 33.6 g/dL (ref 32.0–36.0)
MCV: 87.9 fL (ref 80.0–100.0)
MPV: 9.7 fL (ref 7.5–12.5)
Platelets: 270 10*3/uL (ref 140–400)
RBC: 4.88 10*6/uL (ref 3.80–5.10)
RDW: 12.9 % (ref 11.0–15.0)
WBC: 7.6 10*3/uL (ref 3.8–10.8)

## 2021-08-22 LAB — LIPID PANEL
Cholesterol: 361 mg/dL — ABNORMAL HIGH (ref <200)
HDL: 41 mg/dL — ABNORMAL LOW (ref >=50)
LDL Cholesterol (Calc): 279 mg/dL (calc) — ABNORMAL HIGH
Non-HDL Cholesterol (Calc): 320 mg/dL (calc) — ABNORMAL HIGH (ref <130)
Total CHOL/HDL Ratio: 8.8 (calc) — ABNORMAL HIGH (ref <5.0)
Triglycerides: 222 mg/dL — ABNORMAL HIGH (ref <150)

## 2021-08-22 MED ORDER — AMLODIPINE-OLMESARTAN 10-20 MG PO TABS: 1.0000 | ORAL_TABLET | Freq: Every day | ORAL | 0 refills | Status: DC

## 2021-08-22 MED ORDER — ZOLPIDEM TARTRATE 5 MG PO TABS: 5.0000 mg | ORAL_TABLET | Freq: Every evening | ORAL | 0 refills | Status: DC | PRN

## 2021-08-22 NOTE — Assessment & Plan Note (Signed)
Uncontrolled and amlodipine, will D/C Rx for amlodipine-olmesartan 10-20 mg daily Reinforced DASH diet and exercise for weight loss  RTC in 2 weeks for nurse visit BP check

## 2021-08-22 NOTE — Patient Instructions (Signed)
Health Maintenance for Postmenopausal Women Menopause is a normal process in which your ability to get pregnant comes to an end. This process happens slowly over many months or years, usually between the ages of 48 and 55. Menopause is complete when you have missed your menstrual period for 12 months. It is important to talk with your health care provider about some of the most common conditions that affect women after menopause (postmenopausal women). These include heart disease, cancer, and bone loss (osteoporosis). Adopting a healthy lifestyle and getting preventive care can help to promote your health and wellness. The actions you take can also lower your chances of developing some of these common conditions. What are the signs and symptoms of menopause? During menopause, you may have the following symptoms: Hot flashes. These can be moderate or severe. Night sweats. Decrease in sex drive. Mood swings. Headaches. Tiredness (fatigue). Irritability. Memory problems. Problems falling asleep or staying asleep. Talk with your health care provider about treatment options for your symptoms. Do I need hormone replacement therapy? Hormone replacement therapy is effective in treating symptoms that are caused by menopause, such as hot flashes and night sweats. Hormone replacement carries certain risks, especially as you become older. If you are thinking about using estrogen or estrogen with progestin, discuss the benefits and risks with your health care provider. How can I reduce my risk for heart disease and stroke? The risk of heart disease, heart attack, and stroke increases as you age. One of the causes may be a change in the body's hormones during menopause. This can affect how your body uses dietary fats, triglycerides, and cholesterol. Heart attack and stroke are medical emergencies. There are many things that you can do to help prevent heart disease and stroke. Watch your blood pressure High  blood pressure causes heart disease and increases the risk of stroke. This is more likely to develop in people who have high blood pressure readings or are overweight. Have your blood pressure checked: Every 3-5 years if you are 18-39 years of age. Every year if you are 40 years old or older. Eat a healthy diet  Eat a diet that includes plenty of vegetables, fruits, low-fat dairy products, and lean protein. Do not eat a lot of foods that are high in solid fats, added sugars, or sodium. Get regular exercise Get regular exercise. This is one of the most important things you can do for your health. Most adults should: Try to exercise for at least 150 minutes each week. The exercise should increase your heart rate and make you sweat (moderate-intensity exercise). Try to do strengthening exercises at least twice each week. Do these in addition to the moderate-intensity exercise. Spend less time sitting. Even light physical activity can be beneficial. Other tips Work with your health care provider to achieve or maintain a healthy weight. Do not use any products that contain nicotine or tobacco. These products include cigarettes, chewing tobacco, and vaping devices, such as e-cigarettes. If you need help quitting, ask your health care provider. Know your numbers. Ask your health care provider to check your cholesterol and your blood sugar (glucose). Continue to have your blood tested as directed by your health care provider. Do I need screening for cancer? Depending on your health history and family history, you may need to have cancer screenings at different stages of your life. This may include screening for: Breast cancer. Cervical cancer. Lung cancer. Colorectal cancer. What is my risk for osteoporosis? After menopause, you may be   at increased risk for osteoporosis. Osteoporosis is a condition in which bone destruction happens more quickly than new bone creation. To help prevent osteoporosis or  the bone fractures that can happen because of osteoporosis, you may take the following actions: If you are 19-50 years old, get at least 1,000 mg of calcium and at least 600 international units (IU) of vitamin D per day. If you are older than age 50 but younger than age 70, get at least 1,200 mg of calcium and at least 600 international units (IU) of vitamin D per day. If you are older than age 70, get at least 1,200 mg of calcium and at least 800 international units (IU) of vitamin D per day. Smoking and drinking excessive alcohol increase the risk of osteoporosis. Eat foods that are rich in calcium and vitamin D, and do weight-bearing exercises several times each week as directed by your health care provider. How does menopause affect my mental health? Depression may occur at any age, but it is more common as you become older. Common symptoms of depression include: Feeling depressed. Changes in sleep patterns. Changes in appetite or eating patterns. Feeling an overall lack of motivation or enjoyment of activities that you previously enjoyed. Frequent crying spells. Talk with your health care provider if you think that you are experiencing any of these symptoms. General instructions See your health care provider for regular wellness exams and vaccines. This may include: Scheduling regular health, dental, and eye exams. Getting and maintaining your vaccines. These include: Influenza vaccine. Get this vaccine each year before the flu season begins. Pneumonia vaccine. Shingles vaccine. Tetanus, diphtheria, and pertussis (Tdap) booster vaccine. Your health care provider may also recommend other immunizations. Tell your health care provider if you have ever been abused or do not feel safe at home. Summary Menopause is a normal process in which your ability to get pregnant comes to an end. This condition causes hot flashes, night sweats, decreased interest in sex, mood swings, headaches, or lack  of sleep. Treatment for this condition may include hormone replacement therapy. Take actions to keep yourself healthy, including exercising regularly, eating a healthy diet, watching your weight, and checking your blood pressure and blood sugar levels. Get screened for cancer and depression. Make sure that you are up to date with all your vaccines. This information is not intended to replace advice given to you by your health care provider. Make sure you discuss any questions you have with your health care provider. Document Revised: 07/03/2020 Document Reviewed: 07/03/2020 Elsevier Patient Education  2023 Elsevier Inc.  

## 2021-08-22 NOTE — Assessment & Plan Note (Signed)
Due to ringing in the ears Failed trazodone in the past We will trial Ambien 5 mg daily-sedation caution given

## 2021-08-22 NOTE — Assessment & Plan Note (Signed)
Encourage diet and exercise for weight loss 

## 2021-08-22 NOTE — Progress Notes (Signed)
Subjective:    Patient ID: [name], female    DOB: [birth year], 56 y.o.   MRN: [id number]  HPI  Patient presents to clinic today for her annual exam.  Flu: never Tetanus: 01/2018 COVID: [place] x2 Shingrix: Never Pap smear: 05/2019 Mammogram: 04/2021 Colon screening: never Vision screening: as needed Dentist: biannually  Diet: She does eat meat. She consumes some fruits and veggies. She does eat fried foods. She drinks mostly DT. Coke. Exercise: None  Review of Systems  Past Medical History:  Diagnosis Date   Anxiety    Fatigue    Hyperlipemia    Irregular menstrual cycle    Obesity     Current Outpatient Medications  Medication Sig Dispense Refill   amLODipine (NORVASC) 10 MG tablet Take 1 tablet (10 mg total) by mouth daily. 30 tablet 5   atorvastatin (LIPITOR) 40 MG tablet TAKE 1 TABLET BY MOUTH EVERY DAY 90 tablet 1   FLUoxetine (PROZAC) 10 MG capsule Take 2 capsules (20 mg total) by mouth daily. 180 capsule 0   No current facility-administered medications for this visit.    No Known Allergies  Family History  Problem Relation Age of Onset   Hypertension Mother    Heart disease Father    Hypertension Father    Thyroid disease Father    Heart attack Father 58   CAD Father    Kidney failure Father    Heart failure Maternal Grandmother    Lung cancer Maternal Grandfather    Cancer Paternal Grandmother    Coronary artery disease Paternal Grandmother    Cancer Paternal Grandfather    Diabetes Neg Hx    Breast cancer Neg Hx     Social History   Socioeconomic History   Marital status: Married    Spouse name: Not on file   Number of children: Not on file   Years of education: Not on file   Highest education level: High school graduate  Occupational History   Not on file  Tobacco Use   Smoking status: Every Day    Packs/day: 1.50    Years: 25.00    Total pack years: 37.50    Types: Cigarettes    Start date: 03/23/1993   Smokeless tobacco:  Never  Vaping Use   Vaping Use: Never used  Substance and Sexual Activity   Alcohol use: No    Alcohol/week: 0.0 standard drinks of alcohol   Drug use: No   Sexual activity: Yes    Birth control/protection: Post-menopausal  Other Topics Concern   Not on file  Social History Narrative   Not on file   Social Determinants of Health   Financial Resource Strain: Low Risk  (02/11/2018)   Overall Financial Resource Strain (CARDIA)    Difficulty of Paying Living Expenses: Not very hard  Food Insecurity: No Food Insecurity (02/11/2018)   Hunger Vital Sign    Worried About Running Out of Food in the Last Year: Never true    Ran Out of Food in the Last Year: Never true  Transportation Needs: No Transportation Needs (02/11/2018)   PRAPARE - Hydrologist (Medical): No    Lack of Transportation (Non-Medical): No  Physical Activity: Inactive (02/11/2018)   Exercise Vital Sign    Days of Exercise per Week: 0 days    Minutes of Exercise per Session: 0 min  Stress: No Stress Concern Present (02/11/2018)   [place]  Feeling of Stress : Only a little  Social Connections: Not on file  Intimate Partner Violence: Not At Risk (02/11/2018)   Humiliation, Afraid, Rape, and Kick questionnaire    Fear of Current or Ex-Partner: No    Emotionally Abused: No    Physically Abused: No    Sexually Abused: No     Constitutional: Denies fever, malaise, fatigue, headache or abrupt weight changes.  HEENT: Pt reports ringing in the ears. Denies eye pain, eye redness, ear pain, wax buildup, runny nose, nasal congestion, bloody nose, or sore throat. Respiratory: Denies difficulty breathing, shortness of breath, cough or sputum production.   Cardiovascular: Denies chest pain, chest tightness, palpitations or swelling in the hands or feet.  Gastrointestinal: Pt reports intermittent reflux. Denies abdominal pain,  bloating, constipation, diarrhea or blood in the stool.  GU: Denies urgency, frequency, pain with urination, burning sensation, blood in urine, odor or discharge. Musculoskeletal: Denies decrease in range of motion, difficulty with gait, muscle pain or joint pain and swelling.  Skin: Denies redness, rashes, lesions or ulcercations.  Neurological: Patient reports insomnia.  Denies dizziness, difficulty with memory, difficulty with speech or problems with balance and coordination.  Psych: Patient has a history of anxiety.  Denies depression, SI/HI.  No other specific complaints in a complete review of systems (except as listed in HPI above).     Objective:   Physical Exam  BP (!) 152/74 (BP Location: Right Arm, Patient Position: Sitting, Cuff Size: Normal)   Pulse 73   Temp (!) 96.9 F (36.1 C) (Temporal)   Ht _0  (1.549 m)   Wt 188 lb (85.3 kg)   SpO2 97%   BMI 35.52 kg/m   Wt Readings from Last 3 Encounters:  04/12/21 190 lb (86.2 kg)  03/29/21 190 lb (86.2 kg)  03/15/21 191 lb 3.2 oz (86.7 kg)    General: Appears her stated age, obese, in NAD. Skin: Warm, dry and intact.  HEENT: Head: normal shape and size; Eyes: sclera white, no icterus, conjunctiva pink, PERRLA and EOMs intact;  Neck:  Neck supple, trachea midline. No masses, lumps or thyromegaly present.  Cardiovascular: Normal rate and rhythm. S1,S2 noted.  No murmur, rubs or gallops noted. No JVD or BLE edema. No carotid bruits noted. Pulmonary/Chest: Normal effort and positive vesicular breath sounds. No respiratory distress. No wheezes, rales or ronchi noted.  Abdomen: Soft and nontender. Normal bowel sounds.  Musculoskeletal: Strength 5/5 BUE/BLE. No difficulty with gait.  Neurological: Alert and oriented. Cranial nerves II-XII grossly intact. Coordination normal.  Psychiatric: Mood and affect normal. Behavior is normal. Judgment and thought content normal.    BMET    Component Value Date/Time   NA 139  09/01/2020 0753   NA 144 03/18/2018 0922   NA 140 06/16/2013 1919   K 4.1 09/01/2020 0753   K 3.5 06/16/2013 1919   CL 103 09/01/2020 0753   CL 107 06/16/2013 1919   CO2 26 09/01/2020 0753   CO2 27 06/16/2013 1919   GLUCOSE 89 09/01/2020 0753   GLUCOSE 127 (H) 06/16/2013 1919   BUN 12 09/01/2020 0753   BUN 11 03/18/2018 0922   BUN 13 06/16/2013 1919   CREATININE 0.77 09/01/2020 0753   CALCIUM 9.5 09/01/2020 0753   CALCIUM 8.7 06/16/2013 1919   GFRNONAA 88 09/01/2020 0753   GFRAA 102 09/01/2020 0753    Lipid Panel     Component Value Date/Time   CHOL 274 (H) 09/01/2020 0753   CHOL 300 (H) 01/02/2018  1030   TRIG 168 (H) 09/01/2020 0753   HDL 44 (L) 09/01/2020 0753   HDL 38 (L) 01/02/2018 1030   CHOLHDL 6.2 (H) 09/01/2020 0753   LDLCALC 196 (H) 09/01/2020 0753    CBC    Component Value Date/Time   WBC 12.0 (H) 09/01/2020 0753   RBC 4.69 09/01/2020 0753   HGB 13.8 09/01/2020 0753   HGB 14.9 03/13/2017 0843   HCT 42.3 09/01/2020 0753   HCT 44.7 03/13/2017 0843   PLT 259 09/01/2020 0753   PLT 291 03/13/2017 0843   MCV 90.2 09/01/2020 0753   MCV 91 03/13/2017 0843   MCV 88 06/16/2013 1919   MCH 29.4 09/01/2020 0753   MCHC 32.6 09/01/2020 0753   RDW 12.6 09/01/2020 0753   RDW 13.3 03/13/2017 0843   RDW 12.9 06/16/2013 1919   LYMPHSABS 2,594 05/26/2019 0908   LYMPHSABS 2.9 03/13/2017 0843   EOSABS 254 05/26/2019 0908   EOSABS 0.2 03/13/2017 0843   BASOSABS 85 05/26/2019 0908   BASOSABS 0.1 03/13/2017 0843    Hgb A1C Lab Results  Component Value Date   HGBA1C 4.8 09/01/2020            Assessment & Plan:   Preventative Health Maintenance:  Encouraged her to get a flu shot in the fall Tetanus UTD Encouraged her to get her COVID booster Discussed Shingrix vaccine, she will check coverage with her insurance company and schedule a nurse visit if she would like to have this done Pap smear UTD Mammogram UTD Cologuard ordered Encouraged her to consume  a balanced diet and exercise regimen Advised her to see an eye doctor and dentist annually We will check CBC, c-Met, lipid profile today  RTC in 6 months, follow-up chronic conditions Webb Silversmith, NP

## 2021-08-23 NOTE — Addendum Note (Signed)
Addended by: Lorre Munroe on: 08/23/2021 12:55 PM   Modules accepted: Orders

## 2021-08-24 ENCOUNTER — Encounter: Payer: Self-pay | Admitting: Internal Medicine

## 2021-08-24 NOTE — Telephone Encounter (Signed)
They do not have Azor in stock.  Thanks,   -Vernona Rieger

## 2021-08-24 NOTE — Telephone Encounter (Signed)
Could you call [facility] in [place] and see if they have this in stock?

## 2021-08-29 ENCOUNTER — Other Ambulatory Visit: Payer: Self-pay

## 2021-08-29 MED ORDER — ROSUVASTATIN CALCIUM 20 MG PO TABS: 20.0000 mg | ORAL_TABLET | Freq: Every day | ORAL | 0 refills | Status: DC

## 2021-09-05 ENCOUNTER — Ambulatory Visit: Payer: Managed Care, Other (non HMO)

## 2021-09-05 NOTE — Progress Notes (Signed)
Hypertension, follow-up  BP Readings from Last 3 Encounters:  08/22/21 (!) 152/74  04/12/21 134/72  03/29/21 (!) 177/77   Wt Readings from Last 3 Encounters:  08/22/21 188 lb (85.3 kg)  04/12/21 190 lb (86.2 kg)  03/29/21 190 lb (86.2 kg)     She was last seen for hypertension 08/22/2021. BP at that visit was 152/74. Management since that visit includes starting Amlodipine-olmestaran 10-20mg .  She reports excellent compliance with treatment. She is not having side effects.   ---------------------------------------------------------------------------------------------------

## 2021-09-05 NOTE — Progress Notes (Signed)
Remains elevated. She is maxed out on current medication. Would she be agreeable adding another medication?

## 2021-09-07 MED ORDER — LABETALOL HCL 100 MG PO TABS: 50.0000 mg | ORAL_TABLET | Freq: Two times a day (BID) | ORAL | 0 refills | Status: DC

## 2021-09-07 NOTE — Addendum Note (Signed)
Addended by: Lorre Munroe on: 09/07/2021 07:00 AM   Modules accepted: Orders

## 2021-09-07 NOTE — Progress Notes (Signed)
Medication sent to pharmacy  

## 2021-09-14 ENCOUNTER — Encounter: Payer: Self-pay | Admitting: Internal Medicine

## 2021-09-14 ENCOUNTER — Ambulatory Visit: Payer: Self-pay | Admitting: *Deleted

## 2021-09-14 DIAGNOSIS — R928 Other abnormal and inconclusive findings on diagnostic imaging of breast: Secondary | ICD-10-CM

## 2021-09-14 NOTE — Telephone Encounter (Signed)
Attempted to return call to discuss MyChart message however voicemail is full and cannot accept messages.

## 2021-09-14 NOTE — Telephone Encounter (Signed)
No answer and no voicemail, home number out of service.

## 2021-09-14 NOTE — Telephone Encounter (Signed)
Message from Carleene Overlie sent at 09/14/2021  9:20 AM EDT  Summary: discuss mychart message   Patient requesting a cb regarding a message she received on from the [facility] center on mychart   Please assist further           Call History   Type Contact Phone/Fax User  09/14/2021 09:19 AM EDT Phone (Incoming) Rubbie Battiest R (Self) [phone] Judie Petit) [place], [place] R

## 2021-09-14 NOTE — Telephone Encounter (Signed)
Pt called, unable to LVM d/t mailbox full. Unable to reach patient after 3 attempts by [place] NT, routing to the provider for resolution per protocol.   Summary: discuss mychart message   Patient requesting a cb regarding a message she received on from the breast center on mychart   Please assist further

## 2021-09-17 ENCOUNTER — Encounter: Payer: Self-pay | Admitting: Internal Medicine

## 2021-09-17 LAB — COLOGUARD: COLOGUARD: NEGATIVE

## 2021-09-21 ENCOUNTER — Other Ambulatory Visit: Payer: Self-pay | Admitting: Internal Medicine

## 2021-09-21 NOTE — Telephone Encounter (Signed)
Requested Prescriptions  Pending Prescriptions Disp Refills  . amlodipine-olmesartan (AZOR) 10-20 MG tablet [Pharmacy Med Name: AMLODIPINE-OLMESARTAN 10-20 MG] 90 tablet 1    Sig: TAKE 1 TABLET BY MOUTH EVERY DAY     Cardiovascular: CCB + ARB Combos Failed - 09/21/2021 11:34 AM      Failed - Last BP in normal range    BP Readings from Last 1 Encounters:  09/05/21 (!) 155/67         Passed - K in normal range and within 180 days    Potassium  Date Value Ref Range Status  08/22/2021 3.8 3.5 - 5.3 mmol/L Final  06/16/2013 3.5 3.5 - 5.1 mmol/L Final         Passed - Cr in normal range and within 180 days    Creat  Date Value Ref Range Status  08/22/2021 0.87 0.50 - 1.03 mg/dL Final         Passed - Na in normal range and within 180 days    Sodium  Date Value Ref Range Status  08/22/2021 139 135 - 146 mmol/L Final  03/18/2018 144 134 - 144 mmol/L Final  06/16/2013 140 136 - 145 mmol/L Final         Passed - Patient is not pregnant      Passed - Valid encounter within last 6 months    Recent Outpatient Visits          1 month ago Encounter for general adult medical examination with abnormal findings   [facility] [place], Salvadore Oxford, NP   5 months ago Essential hypertension   [facility] [place], Salvadore Oxford, NP   5 months ago Tinnitus of both ears   [facility] [place], Salvadore Oxford, NP   6 months ago Tinnitus of both ears   [facility] [place], Salvadore Oxford, NP   9 months ago Flank pain   [facility] [place], Salvadore Oxford, NP      Future Appointments            In 6 days [place], Salvadore Oxford, NP [facility], [facility]

## 2021-09-27 ENCOUNTER — Ambulatory Visit: Payer: Managed Care, Other (non HMO) | Admitting: Internal Medicine

## 2021-09-27 ENCOUNTER — Encounter: Payer: Self-pay | Admitting: Internal Medicine

## 2021-09-27 VITALS — BP 148/80 | HR 78 | Temp 96.9°F | Wt 188.0 lb

## 2021-09-27 DIAGNOSIS — H539 Unspecified visual disturbance: Secondary | ICD-10-CM

## 2021-09-27 DIAGNOSIS — R42 Dizziness and giddiness: Secondary | ICD-10-CM

## 2021-09-27 DIAGNOSIS — R519 Headache, unspecified: Secondary | ICD-10-CM

## 2021-09-27 DIAGNOSIS — Z6835 Body mass index (BMI) 35.0-35.9, adult: Secondary | ICD-10-CM

## 2021-09-27 DIAGNOSIS — I10 Essential (primary) hypertension: Secondary | ICD-10-CM | POA: Diagnosis not present

## 2021-09-27 DIAGNOSIS — R110 Nausea: Secondary | ICD-10-CM

## 2021-09-27 DIAGNOSIS — R197 Diarrhea, unspecified: Secondary | ICD-10-CM | POA: Diagnosis not present

## 2021-09-27 DIAGNOSIS — R1013 Epigastric pain: Secondary | ICD-10-CM

## 2021-09-27 NOTE — Progress Notes (Signed)
Subjective:    Patient ID: VETRA SHINALL, female    DOB: 09/22/1965, 56 y.o.   MRN: [id number]  HPI  Patient presents to clinic today for 2-week follow-up of HTN.  At her last visit, her Amlodipine was changed to Amlodipine-Olmesartan and add Labetalol 50 mg BID.  She has been taking the medication as prescribed.  Her BP today is 146/80.  ECG from 05/2016 reviewed.  Pt states she is having headaches, dizziness, blurred vision, nausea, abdominal discomfort and diarrhea.  This started 4 days ago.  The headache is located in the forehead and in the back of her head.  She describes the pain as pressure.  She describes the dizziness as a sense of lightheadedness not that the room is spinning.  She denies runny nose, nasal congestion, ear pain, sore throat, cough, shortness of breath, vomiting, or blood in her stool.  She denies fever, chills or body aches but reports that overall she just does not feel well.  She has not had sick contacts that she is aware of.  She is concerned that these are side effects from the blood pressure medication.  Review of Systems     Past Medical History:  Diagnosis Date   Anxiety    Fatigue    Hyperlipemia    Irregular menstrual cycle    Obesity     Current Outpatient Medications  Medication Sig Dispense Refill   amlodipine-olmesartan (AZOR) 10-20 MG tablet TAKE 1 TABLET BY MOUTH EVERY DAY 90 tablet 1   FLUoxetine (PROZAC) 10 MG capsule Take 2 capsules (20 mg total) by mouth daily. 180 capsule 0   labetalol (NORMODYNE) 100 MG tablet Take 0.5 tablets (50 mg total) by mouth 2 (two) times daily. 30 tablet 0   rosuvastatin (CRESTOR) 20 MG tablet Take 1 tablet (20 mg total) by mouth daily. 90 tablet 0   zolpidem (AMBIEN) 5 MG tablet Take 1 tablet (5 mg total) by mouth at bedtime as needed for sleep. 30 tablet 0   No current facility-administered medications for this visit.    No Known Allergies  Family History  Problem Relation Age of Onset   Hypertension  Mother    Heart disease Father    Hypertension Father    Thyroid disease Father    Heart attack Father 31   CAD Father    Kidney failure Father    Heart failure Maternal Grandmother    Lung cancer Maternal Grandfather    Cancer Paternal Grandmother    Coronary artery disease Paternal Grandmother    Cancer Paternal Grandfather    Diabetes Neg Hx    Breast cancer Neg Hx    Colon cancer Neg Hx     Social History   Socioeconomic History   Marital status: Married    Spouse name: Not on file   Number of children: Not on file   Years of education: Not on file   Highest education level: High school graduate  Occupational History   Not on file  Tobacco Use   Smoking status: Every Day    Packs/day: 1.50    Years: 25.00    Total pack years: 37.50    Types: Cigarettes    Start date: 03/23/1993   Smokeless tobacco: Never  Vaping Use   Vaping Use: Never used  Substance and Sexual Activity   Alcohol use: No    Alcohol/week: 0.0 standard drinks of alcohol   Drug use: No   Sexual activity: Yes    Birth  control/protection: Post-menopausal  Other Topics Concern   Not on file  Social History Narrative   Not on file   Social Determinants of Health   Financial Resource Strain: Low Risk  (02/11/2018)   Overall Financial Resource Strain (CARDIA)    Difficulty of Paying Living Expenses: Not very hard  Food Insecurity: No Food Insecurity (02/11/2018)   Hunger Vital Sign    Worried About Running Out of Food in the Last Year: Never true    Ran Out of Food in the Last Year: Never true  Transportation Needs: No Transportation Needs (02/11/2018)   PRAPARE - Hydrologist (Medical): No    Lack of Transportation (Non-Medical): No  Physical Activity: Inactive (02/11/2018)   Exercise Vital Sign    Days of Exercise per Week: 0 days    Minutes of Exercise per Session: 0 min  Stress: No Stress Concern Present (02/11/2018)   [place]    Feeling of Stress : Only a little  Social Connections: Not on file  Intimate Partner Violence: Not At Risk (02/11/2018)   Humiliation, Afraid, Rape, and Kick questionnaire    Fear of Current or Ex-Partner: No    Emotionally Abused: No    Physically Abused: No    Sexually Abused: No     Constitutional: Patient reports headache, malaise.  Denies fever, fatigue, or abrupt weight changes.  HEENT: Patient reports vision change.  Denies eye pain, eye redness, ear pain, ringing in the ears, wax buildup, runny nose, nasal congestion, bloody nose, or sore throat. Respiratory: Denies difficulty breathing, shortness of breath, cough or sputum production.   Cardiovascular: Denies chest pain, chest tightness, palpitations or swelling in the hands or feet.  Gastrointestinal: Patient reports nausea, abdominal pain and diarrhea.  Denies bloating, constipation, or blood in the stool.  GU: Denies urgency, frequency, pain with urination, burning sensation, blood in urine, odor or discharge. Skin: Denies redness, rashes, lesions or ulcercations.  Neurological: Patient reports dizziness.  Denies difficulty with memory, difficulty with speech or problems with balance and coordination.    No other specific complaints in a complete review of systems (except as listed in HPI above).  Objective:   Physical Exam  BP (!) 148/80 (BP Location: Left Arm, Patient Position: Sitting, Cuff Size: Normal)   Pulse 78   Temp (!) 96.9 F (36.1 C) (Temporal)   Wt 188 lb (85.3 kg)   SpO2 99%   BMI 35.52 kg/m   Wt Readings from Last 3 Encounters:  08/22/21 188 lb (85.3 kg)  04/12/21 190 lb (86.2 kg)  03/29/21 190 lb (86.2 kg)    General: Appears her stated age, obese, appears unwell but in NAD. Skin: Warm, dry and intact.  HEENT: Head: normal shape and size; Eyes: sclera white, no icterus, conjunctiva pink, PERRLA and EOMs intact;  Neck: No adenopathy  noted. Cardiovascular: Normal rate and rhythm. S1,S2 noted.  No murmur, rubs or gallops noted. No JVD or BLE edema.  Pulmonary/Chest: Normal effort and positive vesicular breath sounds. No respiratory distress. No wheezes, rales or ronchi noted.  Abdomen: Soft and nontender. Normal bowel sounds.  Musculoskeletal: No difficulty with gait.  Neurological: Alert and oriented. Coordination normal.    BMET    Component Value Date/Time   NA 139 08/22/2021 0833   NA 144 03/18/2018 0922   NA 140 06/16/2013 1919   K 3.8 08/22/2021 0833   K 3.5 06/16/2013 1919   CL  106 08/22/2021 0833   CL 107 06/16/2013 1919   CO2 26 08/22/2021 0833   CO2 27 06/16/2013 1919   GLUCOSE 81 08/22/2021 0833   GLUCOSE 127 (H) 06/16/2013 1919   BUN 11 08/22/2021 0833   BUN 11 03/18/2018 0922   BUN 13 06/16/2013 1919   CREATININE 0.87 08/22/2021 0833   CALCIUM 9.2 08/22/2021 0833   CALCIUM 8.7 06/16/2013 1919   GFRNONAA 88 09/01/2020 0753   GFRAA 102 09/01/2020 0753    Lipid Panel     Component Value Date/Time   CHOL 361 (H) 08/22/2021 0833   CHOL 300 (H) 01/02/2018 1030   TRIG 222 (H) 08/22/2021 0833   HDL 41 (L) 08/22/2021 0833   HDL 38 (L) 01/02/2018 1030   CHOLHDL 8.8 (H) 08/22/2021 0833   LDLCALC 279 (H) 08/22/2021 0833    CBC    Component Value Date/Time   WBC 7.6 08/22/2021 0833   RBC 4.88 08/22/2021 0833   HGB 14.4 08/22/2021 0833   HGB 14.9 03/13/2017 0843   HCT 42.9 08/22/2021 0833   HCT 44.7 03/13/2017 0843   PLT 270 08/22/2021 0833   PLT 291 03/13/2017 0843   MCV 87.9 08/22/2021 0833   MCV 91 03/13/2017 0843   MCV 88 06/16/2013 1919   MCH 29.5 08/22/2021 0833   MCHC 33.6 08/22/2021 0833   RDW 12.9 08/22/2021 0833   RDW 13.3 03/13/2017 0843   RDW 12.9 06/16/2013 1919   LYMPHSABS 2,594 05/26/2019 0908   LYMPHSABS 2.9 03/13/2017 0843   EOSABS 254 05/26/2019 0908   EOSABS 0.2 03/13/2017 0843   BASOSABS 85 05/26/2019 0908   BASOSABS 0.1 03/13/2017 0843    Hgb A1C Lab  Results  Component Value Date   HGBA1C 4.8 09/01/2020            Assessment & Plan:   Acute Headache, Dizziness, Vision Changes, Nausea, Abdominal Pain and Diarrhea:  These are not typical symptoms of side effects of her blood pressure medications We will check for COVID, flu and RSV Encourage rest and fluids Work note provided  RTC in 2 weeks, follow-up HTN 5 months for follow-up of chronic conditions Nicki Reaper, NP

## 2021-09-27 NOTE — Assessment & Plan Note (Signed)
Encourage diet and exercise for weight loss 

## 2021-09-27 NOTE — Assessment & Plan Note (Signed)
Remains elevated Continue amlodipine-olmesartan for now Hold labetalol until COVID swab is back Reinforced DASH diet and exercise for weight loss  Will address blood pressure once labs are reviewed and we get her to feeling better

## 2021-09-27 NOTE — Patient Instructions (Signed)
Hypertension, Adult ?Hypertension is another name for high blood pressure. High blood pressure forces your heart to work harder to pump blood. This can cause problems over time. ?There are two numbers in a blood pressure reading. There is a top number (systolic) over a bottom number (diastolic). It is best to have a blood pressure that is below 120/80. ?What are the causes? ?The cause of this condition is not known. Some other conditions can lead to high blood pressure. ?What increases the risk? ?Some lifestyle factors can make you more likely to develop high blood pressure: ?Smoking. ?Not getting enough exercise or physical activity. ?Being overweight. ?Having too much fat, sugar, calories, or salt (sodium) in your diet. ?Drinking too much alcohol. ?Other risk factors include: ?Having any of these conditions: ?Heart disease. ?Diabetes. ?High cholesterol. ?Kidney disease. ?Obstructive sleep apnea. ?Having a family history of high blood pressure and high cholesterol. ?Age. The risk increases with age. ?Stress. ?What are the signs or symptoms? ?High blood pressure may not cause symptoms. Very high blood pressure (hypertensive crisis) may cause: ?Headache. ?Fast or uneven heartbeats (palpitations). ?Shortness of breath. ?Nosebleed. ?Vomiting or feeling like you may vomit (nauseous). ?Changes in how you see. ?Very bad chest pain. ?Feeling dizzy. ?Seizures. ?How is this treated? ?This condition is treated by making healthy lifestyle changes, such as: ?Eating healthy foods. ?Exercising more. ?Drinking less alcohol. ?Your doctor may prescribe medicine if lifestyle changes do not help enough and if: ?Your top number is above 130. ?Your bottom number is above 80. ?Your personal target blood pressure may vary. ?Follow these instructions at home: ?Eating and drinking ? ?If told, follow the DASH eating plan. To follow this plan: ?Fill one half of your plate at each meal with fruits and vegetables. ?Fill one fourth of your plate  at each meal with whole grains. Whole grains include whole-wheat pasta, brown rice, and whole-grain bread. ?Eat or drink low-fat dairy products, such as skim milk or low-fat yogurt. ?Fill one fourth of your plate at each meal with low-fat (lean) proteins. Low-fat proteins include fish, chicken without skin, eggs, beans, and tofu. ?Avoid fatty meat, cured and processed meat, or chicken with skin. ?Avoid pre-made or processed food. ?Limit the amount of salt in your diet to less than 1,500 mg each day. ?Do not drink alcohol if: ?Your doctor tells you not to drink. ?You are pregnant, may be pregnant, or are planning to become pregnant. ?If you drink alcohol: ?Limit how much you have to: ?0-1 drink a day for women. ?0-2 drinks a day for men. ?Know how much alcohol is in your drink. In the [place] one drink equals one 12 oz bottle of beer (355 mL), one 5 oz glass of wine (148 mL), or one 1? oz glass of hard liquor (44 mL). ?Lifestyle ? ?Work with your doctor to stay at a healthy weight or to lose weight. Ask your doctor what the best weight is for you. ?Get at least 30 minutes of exercise that causes your heart to beat faster (aerobic exercise) most days of the week. This may include walking, swimming, or biking. ?Get at least 30 minutes of exercise that strengthens your muscles (resistance exercise) at least 3 days a week. This may include lifting weights or doing Pilates. ?Do not smoke or use any products that contain nicotine or tobacco. If you need help quitting, ask your doctor. ?Check your blood pressure at home as told by your doctor. ?Keep all follow-up visits. ?Medicines ?Take over-the-counter and prescription medicines   only as told by your doctor. Follow directions carefully. ?Do not skip doses of blood pressure medicine. The medicine does not work as well if you skip doses. Skipping doses also puts you at risk for problems. ?Ask your doctor about side effects or reactions to medicines that you should watch  for. ?Contact a doctor if: ?You think you are having a reaction to the medicine you are taking. ?You have headaches that keep coming back. ?You feel dizzy. ?You have swelling in your ankles. ?You have trouble with your vision. ?Get help right away if: ?You get a very bad headache. ?You start to feel mixed up (confused). ?You feel weak or numb. ?You feel faint. ?You have very bad pain in your: ?Chest. ?Belly (abdomen). ?You vomit more than once. ?You have trouble breathing. ?These symptoms may be an emergency. Get help right away. Call 911. ?Do not wait to see if the symptoms will go away. ?Do not drive yourself to the hospital. ?Summary ?Hypertension is another name for high blood pressure. ?High blood pressure forces your heart to work harder to pump blood. ?For most people, a normal blood pressure is less than 120/80. ?Making healthy choices can help lower blood pressure. If your blood pressure does not get lower with healthy choices, you may need to take medicine. ?This information is not intended to replace advice given to you by your health care provider. Make sure you discuss any questions you have with your health care provider. ?Document Revised: 11/30/2020 Document Reviewed: 11/30/2020 ?Elsevier Patient Education ? 2023 Elsevier Inc. ? ?

## 2021-09-28 ENCOUNTER — Encounter: Payer: Self-pay | Admitting: Internal Medicine

## 2021-09-28 LAB — SARS-COV-2 RNA, INFLUENZA A/B, AND RSV RNA, QUALITATIVE NAAT
INFLUENZA A RNA: NOT DETECTED
INFLUENZA B RNA: NOT DETECTED
RSV RNA: NOT DETECTED
SARS COV2 RNA: NOT DETECTED

## 2021-09-29 ENCOUNTER — Other Ambulatory Visit: Payer: Self-pay | Admitting: Internal Medicine

## 2021-10-01 NOTE — Telephone Encounter (Signed)
Requested medication (s) are due for refill today: yes  Requested medication (s) are on the active medication list: yes  Last refill:  09/07/21 #30 0 refills  Future visit scheduled: no  Notes to clinic:  do you want to continue refills?     Requested Prescriptions  Pending Prescriptions Disp Refills   labetalol (NORMODYNE) 100 MG tablet [Pharmacy Med Name: LABETALOL HCL 100 MG TABLET] 30 tablet 0    Sig: TAKE 0.5 TABLETS BY MOUTH 2 TIMES DAILY.     Cardiovascular:  Beta Blockers Failed - 09/29/2021  2:35 PM      Failed - Last BP in normal range    BP Readings from Last 1 Encounters:  09/27/21 (!) 148/80         Passed - Last Heart Rate in normal range    Pulse Readings from Last 1 Encounters:  09/27/21 78         Passed - Valid encounter within last 6 months    Recent Outpatient Visits           4 days ago Acute intractable headache, unspecified headache type   [facility] [place], Salvadore Oxford, NP   1 month ago Encounter for general adult medical examination with abnormal findings   [facility] [place], Salvadore Oxford, NP   5 months ago Essential hypertension   [facility] [place], Salvadore Oxford, NP   6 months ago Tinnitus of both ears   [facility] [place], Salvadore Oxford, NP   6 months ago Tinnitus of both ears   [facility], Salvadore Oxford, [place]

## 2021-10-02 ENCOUNTER — Other Ambulatory Visit: Payer: Self-pay | Admitting: Internal Medicine

## 2021-10-02 MED ORDER — HYDRALAZINE HCL 25 MG PO TABS: 25.0000 mg | ORAL_TABLET | Freq: Two times a day (BID) | ORAL | 0 refills | Status: DC

## 2021-10-02 NOTE — Telephone Encounter (Signed)
Requested medications are due for refill today.  no  Requested medications are on the active medications list.  yes  Last refill. 10/01/2021 #90 0 refills  Future visit scheduled.   yes  Notes to clinic.  Pt is requesting 90 day supply.    Requested Prescriptions  Pending Prescriptions Disp Refills   labetalol (NORMODYNE) 100 MG tablet [Pharmacy Med Name: LABETALOL HCL 100 MG TABLET] 270 tablet 1    Sig: TAKE 1 TABLET BY MOUTH 3 TIMES DAILY.     Cardiovascular:  Beta Blockers Failed - 10/02/2021  8:12 AM      Failed - Last BP in normal range    BP Readings from Last 1 Encounters:  09/27/21 (!) 148/80         Passed - Last Heart Rate in normal range    Pulse Readings from Last 1 Encounters:  09/27/21 78         Passed - Valid encounter within last 6 months    Recent Outpatient Visits           5 days ago Acute intractable headache, unspecified headache type   [facility] [place], Salvadore Oxford, NP   1 month ago Encounter for general adult medical examination with abnormal findings   [facility] [place], Salvadore Oxford, NP   5 months ago Essential hypertension   [facility] [place], Salvadore Oxford, NP   6 months ago Tinnitus of both ears   [facility] [place], Salvadore Oxford, NP   6 months ago Tinnitus of both ears   [facility] [place], Salvadore Oxford, NP       Future Appointments             In 2 weeks Sampson Si, Salvadore Oxford, NP [facility]

## 2021-10-02 NOTE — Addendum Note (Signed)
Addended by: Lorre Munroe on: 10/02/2021 03:41 PM   Modules accepted: Orders

## 2021-10-04 ENCOUNTER — Ambulatory Visit
Admission: RE | Admit: 2021-10-04 | Discharge: 2021-10-04 | Disposition: A | Discharge status: Home / Self Care | Payer: Managed Care, Other (non HMO) | Source: Ambulatory Visit | Attending: Internal Medicine | Admitting: Internal Medicine

## 2021-10-04 DIAGNOSIS — R928 Other abnormal and inconclusive findings on diagnostic imaging of breast: Secondary | ICD-10-CM | POA: Insufficient documentation

## 2021-10-19 ENCOUNTER — Encounter: Payer: Self-pay | Admitting: Internal Medicine

## 2021-10-19 ENCOUNTER — Ambulatory Visit: Payer: Managed Care, Other (non HMO) | Admitting: Internal Medicine

## 2021-10-19 VITALS — BP 134/82 | HR 73 | Temp 97.3°F | Wt 188.0 lb

## 2021-10-19 DIAGNOSIS — I10 Essential (primary) hypertension: Secondary | ICD-10-CM | POA: Diagnosis not present

## 2021-10-19 DIAGNOSIS — Z6835 Body mass index (BMI) 35.0-35.9, adult: Secondary | ICD-10-CM

## 2021-10-19 MED ORDER — ZOLPIDEM TARTRATE 5 MG PO TABS: 5.0000 mg | ORAL_TABLET | Freq: Every evening | ORAL | 0 refills | Status: DC | PRN

## 2021-10-19 NOTE — Assessment & Plan Note (Signed)
Controlled on Amlodipine-Benazepril and Hydralazine Reinforced DASH diet and exercise for weight loss

## 2021-10-19 NOTE — Assessment & Plan Note (Signed)
Encouraged diet and exercise for weight loss ?

## 2021-10-19 NOTE — Patient Instructions (Signed)
Hypertension, Adult ?Hypertension is another name for high blood pressure. High blood pressure forces your heart to work harder to pump blood. This can cause problems over time. ?There are two numbers in a blood pressure reading. There is a top number (systolic) over a bottom number (diastolic). It is best to have a blood pressure that is below 120/80. ?What are the causes? ?The cause of this condition is not known. Some other conditions can lead to high blood pressure. ?What increases the risk? ?Some lifestyle factors can make you more likely to develop high blood pressure: ?Smoking. ?Not getting enough exercise or physical activity. ?Being overweight. ?Having too much fat, sugar, calories, or salt (sodium) in your diet. ?Drinking too much alcohol. ?Other risk factors include: ?Having any of these conditions: ?Heart disease. ?Diabetes. ?High cholesterol. ?Kidney disease. ?Obstructive sleep apnea. ?Having a family history of high blood pressure and high cholesterol. ?Age. The risk increases with age. ?Stress. ?What are the signs or symptoms? ?High blood pressure may not cause symptoms. Very high blood pressure (hypertensive crisis) may cause: ?Headache. ?Fast or uneven heartbeats (palpitations). ?Shortness of breath. ?Nosebleed. ?Vomiting or feeling like you may vomit (nauseous). ?Changes in how you see. ?Very bad chest pain. ?Feeling dizzy. ?Seizures. ?How is this treated? ?This condition is treated by making healthy lifestyle changes, such as: ?Eating healthy foods. ?Exercising more. ?Drinking less alcohol. ?Your doctor may prescribe medicine if lifestyle changes do not help enough and if: ?Your top number is above 130. ?Your bottom number is above 80. ?Your personal target blood pressure may vary. ?Follow these instructions at home: ?Eating and drinking ? ?If told, follow the DASH eating plan. To follow this plan: ?Fill one half of your plate at each meal with fruits and vegetables. ?Fill one fourth of your plate  at each meal with whole grains. Whole grains include whole-wheat pasta, brown rice, and whole-grain bread. ?Eat or drink low-fat dairy products, such as skim milk or low-fat yogurt. ?Fill one fourth of your plate at each meal with low-fat (lean) proteins. Low-fat proteins include fish, chicken without skin, eggs, beans, and tofu. ?Avoid fatty meat, cured and processed meat, or chicken with skin. ?Avoid pre-made or processed food. ?Limit the amount of salt in your diet to less than 1,500 mg each day. ?Do not drink alcohol if: ?Your doctor tells you not to drink. ?You are pregnant, may be pregnant, or are planning to become pregnant. ?If you drink alcohol: ?Limit how much you have to: ?0-1 drink a day for women. ?0-2 drinks a day for men. ?Know how much alcohol is in your drink. In the [place] one drink equals one 12 oz bottle of beer (355 mL), one 5 oz glass of wine (148 mL), or one 1? oz glass of hard liquor (44 mL). ?Lifestyle ? ?Work with your doctor to stay at a healthy weight or to lose weight. Ask your doctor what the best weight is for you. ?Get at least 30 minutes of exercise that causes your heart to beat faster (aerobic exercise) most days of the week. This may include walking, swimming, or biking. ?Get at least 30 minutes of exercise that strengthens your muscles (resistance exercise) at least 3 days a week. This may include lifting weights or doing Pilates. ?Do not smoke or use any products that contain nicotine or tobacco. If you need help quitting, ask your doctor. ?Check your blood pressure at home as told by your doctor. ?Keep all follow-up visits. ?Medicines ?Take over-the-counter and prescription medicines   only as told by your doctor. Follow directions carefully. ?Do not skip doses of blood pressure medicine. The medicine does not work as well if you skip doses. Skipping doses also puts you at risk for problems. ?Ask your doctor about side effects or reactions to medicines that you should watch  for. ?Contact a doctor if: ?You think you are having a reaction to the medicine you are taking. ?You have headaches that keep coming back. ?You feel dizzy. ?You have swelling in your ankles. ?You have trouble with your vision. ?Get help right away if: ?You get a very bad headache. ?You start to feel mixed up (confused). ?You feel weak or numb. ?You feel faint. ?You have very bad pain in your: ?Chest. ?Belly (abdomen). ?You vomit more than once. ?You have trouble breathing. ?These symptoms may be an emergency. Get help right away. Call 911. ?Do not wait to see if the symptoms will go away. ?Do not drive yourself to the hospital. ?Summary ?Hypertension is another name for high blood pressure. ?High blood pressure forces your heart to work harder to pump blood. ?For most people, a normal blood pressure is less than 120/80. ?Making healthy choices can help lower blood pressure. If your blood pressure does not get lower with healthy choices, you may need to take medicine. ?This information is not intended to replace advice given to you by your health care provider. Make sure you discuss any questions you have with your health care provider. ?Document Revised: 11/30/2020 Document Reviewed: 11/30/2020 ?Elsevier Patient Education ? 2023 Elsevier Inc. ? ?

## 2021-10-19 NOTE — Progress Notes (Signed)
Subjective:    Patient ID: [name], female    DOB: 08-Mar-1965, 56 y.o.   MRN: [id number]  HPI  Patient presents to clinic today for follow-up of HTN.  She was started on Hydralazine 25 mg 2 times daily in addition to her Amlodipine-Olmesartan.  She has been taking the medication as prescribed.  Her BP today is 134/82.  ECG from 05/2016 reviewed.    Review of Systems     Past Medical History:  Diagnosis Date   Anxiety    Fatigue    Hyperlipemia    Irregular menstrual cycle    Obesity     Current Outpatient Medications  Medication Sig Dispense Refill   amlodipine-olmesartan (AZOR) 10-20 MG tablet TAKE 1 TABLET BY MOUTH EVERY DAY 90 tablet 1   FLUoxetine (PROZAC) 10 MG capsule Take 2 capsules (20 mg total) by mouth daily. 180 capsule 0   hydrALAZINE (APRESOLINE) 25 MG tablet Take 1 tablet (25 mg total) by mouth 2 (two) times daily. 60 tablet 0   labetalol (NORMODYNE) 100 MG tablet Take 1 tablet (100 mg total) by mouth 3 (three) times daily. 90 tablet 0   rosuvastatin (CRESTOR) 20 MG tablet Take 1 tablet (20 mg total) by mouth daily. 90 tablet 0   zolpidem (AMBIEN) 5 MG tablet Take 1 tablet (5 mg total) by mouth at bedtime as needed for sleep. 30 tablet 0   No current facility-administered medications for this visit.    No Known Allergies  Family History  Problem Relation Age of Onset   Hypertension Mother    Heart disease Father    Hypertension Father    Thyroid disease Father    Heart attack Father 59   CAD Father    Kidney failure Father    Heart failure Maternal Grandmother    Lung cancer Maternal Grandfather    Cancer Paternal Grandmother    Coronary artery disease Paternal Grandmother    Cancer Paternal Grandfather    Diabetes Neg Hx    Breast cancer Neg Hx    Colon cancer Neg Hx     Social History   Socioeconomic History   Marital status: Married    Spouse name: Not on file   Number of children: Not on file   Years of education: Not on file    Highest education level: High school graduate  Occupational History   Not on file  Tobacco Use   Smoking status: Every Day    Packs/day: 1.50    Years: 25.00    Total pack years: 37.50    Types: Cigarettes    Start date: 03/23/1993   Smokeless tobacco: Never  Vaping Use   Vaping Use: Never used  Substance and Sexual Activity   Alcohol use: No    Alcohol/week: 0.0 standard drinks of alcohol   Drug use: No   Sexual activity: Yes    Birth control/protection: Post-menopausal  Other Topics Concern   Not on file  Social History Narrative   Not on file   Social Determinants of Health   Financial Resource Strain: Low Risk  (02/11/2018)   Overall Financial Resource Strain (CARDIA)    Difficulty of Paying Living Expenses: Not very hard  Food Insecurity: No Food Insecurity (02/11/2018)   Hunger Vital Sign    Worried About Running Out of Food in the Last Year: Never true    Ran Out of Food in the Last Year: Never true  Transportation Needs: No Transportation Needs (02/11/2018)  PRAPARE - Administrator, Civil Service (Medical): No    Lack of Transportation (Non-Medical): No  Physical Activity: Inactive (02/11/2018)   Exercise Vital Sign    Days of Exercise per Week: 0 days    Minutes of Exercise per Session: 0 min  Stress: No Stress Concern Present (02/11/2018)   [employer] of Occupational Health - Occupational Stress Questionnaire    Feeling of Stress : Only a little  Social Connections: Not on file  Intimate Partner Violence: Not At Risk (02/11/2018)   Humiliation, Afraid, Rape, and Kick questionnaire    Fear of Current or Ex-Partner: No    Emotionally Abused: No    Physically Abused: No    Sexually Abused: No     Constitutional: Denies fever, malaise, fatigue, headache or abrupt weight changes.  Respiratory: Denies difficulty breathing, shortness of breath, cough or sputum production.   Cardiovascular: Denies chest pain, chest tightness, palpitations  or swelling in the hands or feet.  Neurological: Denies dizziness, difficulty with memory, difficulty with speech or problems with balance and coordination.    No other specific complaints in a complete review of systems (except as listed in HPI above).  Objective:   Physical Exam BP 134/82 (BP Location: Right Arm, Patient Position: Sitting, Cuff Size: Normal)   Pulse 73   Temp (!) 97.3 F (36.3 C) (Temporal)   Wt 188 lb (85.3 kg)   SpO2 99%   BMI 35.52 kg/m   Wt Readings from Last 3 Encounters:  09/27/21 188 lb (85.3 kg)  08/22/21 188 lb (85.3 kg)  04/12/21 190 lb (86.2 kg)    General: Appears her stated age, obese, in NAD. Cardiovascular: Normal rate and rhythm. S1,S2 noted.  No murmur, rubs or gallops noted.  Pulmonary/Chest: Normal effort and positive vesicular breath sounds. No respiratory distress. No wheezes, rales or ronchi noted.  Neurological: Alert and oriented.  Coordination normal.   BMET    Component Value Date/Time   NA 139 08/22/2021 0833   NA 144 03/18/2018 0922   NA 140 06/16/2013 1919   K 3.8 08/22/2021 0833   K 3.5 06/16/2013 1919   CL 106 08/22/2021 0833   CL 107 06/16/2013 1919   CO2 26 08/22/2021 0833   CO2 27 06/16/2013 1919   GLUCOSE 81 08/22/2021 0833   GLUCOSE 127 (H) 06/16/2013 1919   BUN 11 08/22/2021 0833   BUN 11 03/18/2018 0922   BUN 13 06/16/2013 1919   CREATININE 0.87 08/22/2021 0833   CALCIUM 9.2 08/22/2021 0833   CALCIUM 8.7 06/16/2013 1919   GFRNONAA 88 09/01/2020 0753   GFRAA 102 09/01/2020 0753    Lipid Panel     Component Value Date/Time   CHOL 361 (H) 08/22/2021 0833   CHOL 300 (H) 01/02/2018 1030   TRIG 222 (H) 08/22/2021 0833   HDL 41 (L) 08/22/2021 0833   HDL 38 (L) 01/02/2018 1030   CHOLHDL 8.8 (H) 08/22/2021 0833   LDLCALC 279 (H) 08/22/2021 0833    CBC    Component Value Date/Time   WBC 7.6 08/22/2021 0833   RBC 4.88 08/22/2021 0833   HGB 14.4 08/22/2021 0833   HGB 14.9 03/13/2017 0843   HCT 42.9  08/22/2021 0833   HCT 44.7 03/13/2017 0843   PLT 270 08/22/2021 0833   PLT 291 03/13/2017 0843   MCV 87.9 08/22/2021 0833   MCV 91 03/13/2017 0843   MCV 88 06/16/2013 1919   MCH 29.5 08/22/2021 0833   MCHC 33.6  08/22/2021 0833   RDW 12.9 08/22/2021 0833   RDW 13.3 03/13/2017 0843   RDW 12.9 06/16/2013 1919   LYMPHSABS 2,594 05/26/2019 0908   LYMPHSABS 2.9 03/13/2017 0843   EOSABS 254 05/26/2019 0908   EOSABS 0.2 03/13/2017 0843   BASOSABS 85 05/26/2019 0908   BASOSABS 0.1 03/13/2017 0843    Hgb A1C Lab Results  Component Value Date   HGBA1C 4.8 09/01/2020           Assessment & Plan:      RTC in 4 months for follow-up of chronic conditions Nicki Reaper, NP

## 2021-10-24 ENCOUNTER — Other Ambulatory Visit: Payer: Self-pay | Admitting: Internal Medicine

## 2021-10-24 NOTE — Telephone Encounter (Signed)
Requested medications are due for refill today.  yes  Requested medications are on the active medications list.  yes  Last refill. 10/02/2021 #60 0 refills  Future visit scheduled.   yes  Notes to clinic.  Missing labs.    Requested Prescriptions  Pending Prescriptions Disp Refills   hydrALAZINE (APRESOLINE) 25 MG tablet [Pharmacy Med Name: HYDRALAZINE 25 MG TABLET] 60 tablet 0    Sig: TAKE 1 TABLET BY MOUTH TWICE A DAY     Cardiovascular:  Vasodilators Failed - 10/24/2021 10:30 AM      Failed - ANA Screen, Ifa, Serum in normal range and within 360 days    No results found for: "ANA", "ANATITER", "LABANTI"       Passed - HCT in normal range and within 360 days    HCT  Date Value Ref Range Status  08/22/2021 42.9 35.0 - 45.0 % Final   Hematocrit  Date Value Ref Range Status  03/13/2017 44.7 34.0 - 46.6 % Final         Passed - HGB in normal range and within 360 days    Hemoglobin  Date Value Ref Range Status  08/22/2021 14.4 11.7 - 15.5 g/dL Final  46/50/3546 56.8 11.1 - 15.9 g/dL Final         Passed - RBC in normal range and within 360 days    RBC  Date Value Ref Range Status  08/22/2021 4.88 3.80 - 5.10 Million/uL Final         Passed - WBC in normal range and within 360 days    WBC  Date Value Ref Range Status  08/22/2021 7.6 3.8 - 10.8 Thousand/uL Final         Passed - PLT in normal range and within 360 days    Platelets  Date Value Ref Range Status  08/22/2021 270 140 - 400 Thousand/uL Final  03/13/2017 291 150 - 379 x10E3/uL Final         Passed - Last BP in normal range    BP Readings from Last 1 Encounters:  10/19/21 134/82         Passed - Valid encounter within last 12 months    Recent Outpatient Visits           5 days ago Essential hypertension   [facility] [place], Salvadore Oxford, NP   3 weeks ago Acute intractable headache, unspecified headache type   [facility] [place], Salvadore Oxford, NP   2 months ago Encounter  for general adult medical examination with abnormal findings   [facility] [place], Salvadore Oxford, NP   6 months ago Essential hypertension   [facility] [place] [place], Salvadore Oxford, NP   6 months ago Tinnitus of both ears   [facility] [place], Salvadore Oxford, [place]

## 2021-11-02 ENCOUNTER — Ambulatory Visit: Payer: Self-pay

## 2021-11-02 ENCOUNTER — Ambulatory Visit: Payer: Managed Care, Other (non HMO) | Admitting: Physician Assistant

## 2021-11-02 VITALS — BP 130/70 | Resp 16 | Ht 61.0 in | Wt 187.0 lb

## 2021-11-02 DIAGNOSIS — M545 Low back pain, unspecified: Secondary | ICD-10-CM

## 2021-11-02 MED ORDER — CYCLOBENZAPRINE HCL 5 MG PO TABS: 5.0000 mg | ORAL_TABLET | Freq: Three times a day (TID) | ORAL | 0 refills | Status: DC | PRN

## 2021-11-02 MED ORDER — MELOXICAM 15 MG PO TABS: 15.0000 mg | ORAL_TABLET | Freq: Every day | ORAL | 0 refills | Status: DC

## 2021-11-02 NOTE — Progress Notes (Signed)
Acute Office Visit   Patient: [name]   DOB: [birth year]   56 y.o. Female  MRN: [id number] Visit Date: 11/02/2021  Today's healthcare provider: Oswaldo Conroy Charlynn Salih, PA-C  Introduced myself to the patient as a Secondary school teacher and provided education on APPs in clinical practice.    Chief Complaint  Patient presents with   Back Pain    Pt reports she twisted wrong getting out of car.    Subjective    HPI HPI     Back Pain    Additional comments: Pt reports she twisted wrong getting out of car.       Last edited by Skeet Latch, CMA on 11/02/2021  2:11 PM.       Back pain Onset: sudden Duration: one day Thinks she may have pulled a muscle getting out of her car yesterday  Location: lower back  Pain level and character: 7/10 dull and aching  Aggravating: walking and extension  Alleviating: sitting  Intervention: she has tried icing, Aleve, Tylenol without relief   Denies radiation of pain down legs  Denies numbness or tingling in legs  Denies saddle anesthesia and incontinence  Medications: Outpatient Medications Prior to Visit  Medication Sig   amlodipine-olmesartan (AZOR) 10-20 MG tablet TAKE 1 TABLET BY MOUTH EVERY DAY   FLUoxetine (PROZAC) 10 MG capsule Take 2 capsules (20 mg total) by mouth daily.   hydrALAZINE (APRESOLINE) 25 MG tablet TAKE 1 TABLET BY MOUTH TWICE A DAY   rosuvastatin (CRESTOR) 20 MG tablet Take 1 tablet (20 mg total) by mouth daily.   zolpidem (AMBIEN) 5 MG tablet Take 1 tablet (5 mg total) by mouth at bedtime as needed for sleep.   No facility-administered medications prior to visit.    Review of Systems  Musculoskeletal:  Positive for back pain.  Neurological:  Negative for tremors, weakness and numbness.       Objective    BP 130/70   Resp 16   Ht 5\' 1"  (1.549 m)   Wt 187 lb (84.8 kg)   BMI 35.33 kg/m    Physical Exam Vitals reviewed.  Constitutional:      General: She is awake.     Appearance: Normal appearance. She is  well-developed, well-groomed and overweight.  HENT:     Head: Normocephalic and atraumatic.  Pulmonary:     Effort: Pulmonary effort is normal.  Musculoskeletal:     Cervical back: No swelling, edema, deformity or erythema. Normal range of motion.     Thoracic back: No swelling, edema, deformity or tenderness. Normal range of motion.     Lumbar back: Tenderness present. No swelling, edema or spasms. Decreased range of motion. Negative right straight leg raise test and negative left straight leg raise test.       Back:     Comments: Pain and decreased ROM with lateral rotation to the left and extension  Forward flexion is not painful. Lateral flexion is not painful per pt.    Neurological:     Mental Status: She is alert and oriented to person, place, and time.     GCS: GCS eye subscore is 4. GCS verbal subscore is 5. GCS motor subscore is 6.     Cranial Nerves: No dysarthria or facial asymmetry.     Motor: Motor function is intact. No weakness or tremor.  Psychiatric:        Behavior: Behavior is cooperative.  No results found for any visits on 11/02/21.  Assessment & Plan      No follow-ups on file.      Problem List Items Addressed This Visit   None Visit Diagnoses     Acute bilateral low back pain without sciatica    -  Primary Acute, new concern Reports back pain x 1 day without relief from home therapies- Aleve, Tylenol, cold compresses.  Suspect muscular etiology given symptoms and PE Recommend conservative management at this time- Will send in meloxicam 15 mg PO QD and Flexeril 5 mg PO TID PRN to assist with relief Recommend only taking Flexeril at bedtime and advised against using when she will have to drive or concentrate on tasks due to associated sedation She can continue to use Tylenol as needed for pain, recommend warm compresses, gentle stretches and massage  Follow up as needed for persistent or progressing symptoms    Relevant Medications    cyclobenzaprine (FLEXERIL) 5 MG tablet   meloxicam (MOBIC) 15 MG tablet        No follow-ups on file.   I, Ammon Muscatello E Jessen Siegman, PA-C, have reviewed all documentation for this visit. The documentation on 11/02/21 for the exam, diagnosis, procedures, and orders are all accurate and complete.   Jacquelin Hawking, MHS, PA-C [facility]

## 2021-11-02 NOTE — Telephone Encounter (Signed)
    Chief Complaint: Back pain, middle of back Symptoms: Above Frequency: Yesterday Pertinent Negatives: Patient denies weakness Disposition: [] ED /[] Urgent Care (no appt availability in office) / [x] Appointment(In office/virtual)/ []  [place] [facility]/ [] Home Care/ [] Refused Recommended Disposition /[] [place] [facility]/ []  Follow-up with PCP Additional Notes:   Reason for Disposition  [1] Age > 50 AND [2] no history of prior similar back pain  Answer Assessment - Initial Assessment Questions 1. ONSET: "When did the pain begin?"      Yesterday 2. LOCATION: "Where does it hurt?" (upper, mid or lower back)     Middle 3. SEVERITY: "How bad is the pain?"  (e.g., Scale 1-10; mild, moderate, or severe)   - MILD (1-3): Doesn't interfere with normal activities.    - MODERATE (4-7): Interferes with normal activities or awakens from sleep.    - SEVERE (8-10): Excruciating pain, unable to do any normal activities.      Now - 6-7 4. PATTERN: "Is the pain constant?" (e.g., yes, no; constant, intermittent)      Walking is worse 5. RADIATION: "Does the pain shoot into your legs or somewhere else?"     No 6. CAUSE:  "What do you think is causing the back pain?"      Twisted 7. BACK OVERUSE:  "Any recent lifting of heavy objects, strenuous work or exercise?"     No 8. MEDICINES: "What have you taken so far for the pain?" (e.g., nothing, acetaminophen, NSAIDS)     No 9. NEUROLOGIC SYMPTOMS: "Do you have any weakness, numbness, or problems with bowel/bladder control?"     No 10. OTHER SYMPTOMS: "Do you have any other symptoms?" (e.g., fever, abdomen pain, burning with urination, blood in urine)       No 11. PREGNANCY: "Is there any chance you are pregnant?" "When was your last menstrual period?"       No  Protocols used: Back Pain-A-AH

## 2021-11-02 NOTE — Patient Instructions (Addendum)
Based on your symptoms and physical exam I believe the following is the cause of your concern today Back pain likely secondary to a strain of your back muscles  I recommend the following at this time to help relieve that discomfort:  Rest Warm compresses to the area (20 minutes on, minimum of 30 minutes off) You can alternate Tylenol and Ibuprofen for pain management but Ibuprofen is typically preferred to reduce inflammation.  I have sent in a script for Meloxicam 15 mg to be taken by mouth once per day. This is an NSAID (like Ibuprofen, Advil, Aleve) so you should not take other NSAIDs while taking this. Stay well hydrated while taking this medication and take with food.  Gentle stretches and exercises that I have included in your paperwork Try to reduce excess strain to the area and rest as much as possible  Wear supportive shoes and, if you must lift anything, use proper lifting techniques that spare your back.   If these measures do not lead to improvement in your symptoms over the next 2-4 weeks please let us know

## 2021-11-29 ENCOUNTER — Other Ambulatory Visit: Payer: Self-pay | Admitting: Physician Assistant

## 2021-11-29 DIAGNOSIS — M545 Low back pain, unspecified: Secondary | ICD-10-CM

## 2021-11-29 NOTE — Telephone Encounter (Signed)
Requested Prescriptions  Pending Prescriptions Disp Refills  . meloxicam (MOBIC) 15 MG tablet [Pharmacy Med Name: MELOXICAM 15 MG TABLET] 30 tablet 0    Sig: TAKE 1 TABLET (15 MG TOTAL) BY MOUTH DAILY.     Analgesics:  COX2 Inhibitors Failed - 11/29/2021  2:35 AM      Failed - Manual Review: Labs are only required if the patient has taken medication for more than 8 weeks.      Passed - HGB in normal range and within 360 days    Hemoglobin  Date Value Ref Range Status  08/22/2021 14.4 11.7 - 15.5 g/dL Final  03/13/2017 14.9 11.1 - 15.9 g/dL Final         Passed - Cr in normal range and within 360 days    Creat  Date Value Ref Range Status  08/22/2021 0.87 0.50 - 1.03 mg/dL Final         Passed - HCT in normal range and within 360 days    HCT  Date Value Ref Range Status  08/22/2021 42.9 35.0 - 45.0 % Final   Hematocrit  Date Value Ref Range Status  03/13/2017 44.7 34.0 - 46.6 % Final         Passed - AST in normal range and within 360 days    AST  Date Value Ref Range Status  08/22/2021 18 10 - 35 U/L Final         Passed - ALT in normal range and within 360 days    ALT  Date Value Ref Range Status  08/22/2021 18 6 - 29 U/L Final         Passed - eGFR is 30 or above and within 360 days    GFR, Est African American  Date Value Ref Range Status  09/01/2020 102 > OR = 60 mL/min/1.73m2 Final   GFR, Est Non African American  Date Value Ref Range Status  09/01/2020 88 > OR = 60 mL/min/1.83m2 Final   eGFR  Date Value Ref Range Status  08/22/2021 79 > OR = 60 mL/min/1.67m2 Final    Comment:    The eGFR is based on the CKD-EPI 2021 equation. To calculate  the new eGFR from a previous Creatinine or Cystatin C result, go to https://www.kidney.org/professionals/ kdoqi/gfr%5Fcalculator          Passed - Patient is not pregnant      Passed - Valid encounter within last 12 months    Recent Outpatient Visits          3 weeks ago Acute bilateral low back pain without  sciatica   [place], PA-C   1 month ago Essential hypertension   [place], [place], [place]   2 months ago Acute intractable headache, unspecified headache type   [place], NP   3 months ago Encounter for general adult medical examination with abnormal findings   [facility], Coralie Keens, NP   7 months ago Essential hypertension   [facility] [place], Coralie Keens, NP

## 2021-12-05 ENCOUNTER — Other Ambulatory Visit: Payer: Self-pay

## 2021-12-05 DIAGNOSIS — E782 Mixed hyperlipidemia: Secondary | ICD-10-CM

## 2021-12-06 ENCOUNTER — Other Ambulatory Visit: Payer: Managed Care, Other (non HMO)

## 2021-12-07 ENCOUNTER — Encounter: Payer: Self-pay | Admitting: Internal Medicine

## 2021-12-07 DIAGNOSIS — F419 Anxiety disorder, unspecified: Secondary | ICD-10-CM

## 2021-12-07 LAB — LIPID PANEL
Cholesterol: 302 mg/dL — ABNORMAL HIGH (ref <200)
HDL: 35 mg/dL — ABNORMAL LOW (ref >=50)
LDL Cholesterol (Calc): 213 mg/dL (calc) — ABNORMAL HIGH
Non-HDL Cholesterol (Calc): 267 mg/dL (calc) — ABNORMAL HIGH (ref <130)
Total CHOL/HDL Ratio: 8.6 (calc) — ABNORMAL HIGH (ref <5.0)
Triglycerides: 321 mg/dL — ABNORMAL HIGH (ref <150)

## 2021-12-10 MED ORDER — FLUOXETINE HCL 10 MG PO CAPS: 20.0000 mg | ORAL_CAPSULE | Freq: Every day | ORAL | 0 refills | Status: DC

## 2021-12-10 MED ORDER — HYDRALAZINE HCL 25 MG PO TABS: 25.0000 mg | ORAL_TABLET | Freq: Two times a day (BID) | ORAL | 0 refills | Status: DC

## 2021-12-10 MED ORDER — AMLODIPINE-OLMESARTAN 10-20 MG PO TABS: 1.0000 | ORAL_TABLET | Freq: Every day | ORAL | 0 refills | Status: DC

## 2021-12-10 MED ORDER — ROSUVASTATIN CALCIUM 20 MG PO TABS: 20.0000 mg | ORAL_TABLET | Freq: Every day | ORAL | 0 refills | Status: DC

## 2022-03-17 ENCOUNTER — Other Ambulatory Visit: Payer: Self-pay | Admitting: Internal Medicine

## 2022-03-17 DIAGNOSIS — F419 Anxiety disorder, unspecified: Secondary | ICD-10-CM

## 2022-03-21 ENCOUNTER — Encounter: Payer: Self-pay | Admitting: Internal Medicine

## 2022-03-21 DIAGNOSIS — R928 Other abnormal and inconclusive findings on diagnostic imaging of breast: Secondary | ICD-10-CM

## 2022-04-19 ENCOUNTER — Ambulatory Visit
Admission: RE | Admit: 2022-04-19 | Discharge: 2022-04-19 | Disposition: A | Discharge status: Home / Self Care | Payer: Managed Care, Other (non HMO) | Source: Ambulatory Visit | Attending: Internal Medicine | Admitting: Internal Medicine

## 2022-04-19 ENCOUNTER — Other Ambulatory Visit: Payer: Self-pay | Admitting: Internal Medicine

## 2022-04-19 DIAGNOSIS — R928 Other abnormal and inconclusive findings on diagnostic imaging of breast: Secondary | ICD-10-CM | POA: Diagnosis present

## 2022-05-03 ENCOUNTER — Encounter: Payer: Self-pay | Admitting: Internal Medicine

## 2022-07-29 ENCOUNTER — Encounter: Payer: Self-pay | Admitting: Internal Medicine

## 2022-07-29 ENCOUNTER — Ambulatory Visit: Payer: Managed Care, Other (non HMO) | Admitting: Internal Medicine

## 2022-07-29 VITALS — BP 136/86 | HR 67 | Temp 96.9°F | Wt 186.0 lb

## 2022-07-29 DIAGNOSIS — I10 Essential (primary) hypertension: Secondary | ICD-10-CM

## 2022-07-29 DIAGNOSIS — E782 Mixed hyperlipidemia: Secondary | ICD-10-CM

## 2022-07-29 DIAGNOSIS — F5104 Psychophysiologic insomnia: Secondary | ICD-10-CM

## 2022-07-29 DIAGNOSIS — F419 Anxiety disorder, unspecified: Secondary | ICD-10-CM | POA: Diagnosis not present

## 2022-07-29 DIAGNOSIS — K219 Gastro-esophageal reflux disease without esophagitis: Secondary | ICD-10-CM | POA: Diagnosis not present

## 2022-07-29 DIAGNOSIS — M79661 Pain in right lower leg: Secondary | ICD-10-CM

## 2022-07-29 DIAGNOSIS — Z6835 Body mass index (BMI) 35.0-35.9, adult: Secondary | ICD-10-CM

## 2022-07-29 DIAGNOSIS — G4733 Obstructive sleep apnea (adult) (pediatric): Secondary | ICD-10-CM

## 2022-07-29 MED ORDER — PREDNISONE 10 MG PO TABS: ORAL_TABLET | ORAL | 0 refills | Status: DC

## 2022-07-29 NOTE — Assessment & Plan Note (Signed)
Encouraged her to consume low-fat diet Continue rosuvastatin for now unless myalgias persist and will consider holding this to see if symptoms resolve

## 2022-07-29 NOTE — Progress Notes (Signed)
Subjective:    Patient ID: [name], female    DOB: Jan 23, 1966, 57 y.o.   MRN: [id number]  HPI  Patient presents to clinic today for follow-up of chronic conditions.  She also complains of right calf pain.  This started about a week ago after working in her garden.  She describes the pain as an achy. The pain  does not worsen with ambulation but keeps her up at night. She has not noticed any swelling. She has tried heat, ice, creams, muscle relaxers with minimal relief of symptoms.   HTN: Her BP today is 136/86.  She is taking Amlodipine-Olmesartan and Hydralazine as prescribed.  ECG from 05/2016 reviewed.  OSA: She5 averages hours of sleep per night without the use of her CPAP.  There is no sleep study on file.  GERD: Triggered by spicy and tomato based. She takes Tums as needed with good relief of symptoms. There is no upper GI on file.  Insomnia: She has difficulty falling asleep. She takes Ambien as needed. There is no sleep study on file.  Anxiety: Chronic, managed on Fluoxetine. She is not currently seeing a therapist. She denies depression, SI/HI.  HLD: Her last LDL was 213, triglycerides 32, 11/2021. She denies myalgias on Rosuvastatin. She has been trying to consume a low fat diet.  Review of Systems     Past Medical History:  Diagnosis Date   Anxiety    Fatigue    Hyperlipemia    Irregular menstrual cycle    Obesity     Current Outpatient Medications  Medication Sig Dispense Refill   amlodipine-olmesartan (AZOR) 10-20 MG tablet Take 1 tablet by mouth daily. 90 tablet 0   cyclobenzaprine (FLEXERIL) 5 MG tablet Take 1 tablet (5 mg total) by mouth 3 (three) times daily as needed for muscle spasms. 30 tablet 0   FLUoxetine (PROZAC) 10 MG capsule TAKE 2 CAPSULES BY MOUTH DAILY 180 capsule 0   hydrALAZINE (APRESOLINE) 25 MG tablet Take 1 tablet (25 mg total) by mouth 2 (two) times daily. 180 tablet 0   meloxicam (MOBIC) 15 MG tablet TAKE 1 TABLET (15 MG TOTAL) BY MOUTH  DAILY. 30 tablet 0   rosuvastatin (CRESTOR) 20 MG tablet TAKE 1 TABLET BY MOUTH EVERY DAY 90 tablet 0   zolpidem (AMBIEN) 5 MG tablet Take 1 tablet (5 mg total) by mouth at bedtime as needed for sleep. 30 tablet 0   No current facility-administered medications for this visit.    No Known Allergies  Family History  Problem Relation Age of Onset   Hypertension Mother    Heart disease Father    Hypertension Father    Thyroid disease Father    Heart attack Father 77   CAD Father    Kidney failure Father    Heart failure Maternal Grandmother    Lung cancer Maternal Grandfather    Cancer Paternal Grandmother    Coronary artery disease Paternal Grandmother    Cancer Paternal Grandfather    Diabetes Neg Hx    Breast cancer Neg Hx    Colon cancer Neg Hx     Social History   Socioeconomic History   Marital status: Married    Spouse name: Not on file   Number of children: Not on file   Years of education: Not on file   Highest education level: High school graduate  Occupational History   Not on file  Tobacco Use   Smoking status: Every Day    Packs/day:  1.50    Years: 25.00    Additional pack years: 0.00    Total pack years: 37.50    Types: Cigarettes    Start date: 03/23/1993   Smokeless tobacco: Never  Vaping Use   Vaping Use: Never used  Substance and Sexual Activity   Alcohol use: No    Alcohol/week: 0.0 standard drinks of alcohol   Drug use: No   Sexual activity: Yes    Birth control/protection: Post-menopausal  Other Topics Concern   Not on file  Social History Narrative   Not on file   Social Determinants of Health   Financial Resource Strain: Low Risk  (02/11/2018)   Overall Financial Resource Strain (CARDIA)    Difficulty of Paying Living Expenses: Not very hard  Food Insecurity: No Food Insecurity (02/11/2018)   Hunger Vital Sign    Worried About Running Out of Food in the Last Year: Never true    Ran Out of Food in the Last Year: Never true   Transportation Needs: No Transportation Needs (02/11/2018)   PRAPARE - Administrator, Civil Service (Medical): No    Lack of Transportation (Non-Medical): No  Physical Activity: Inactive (02/11/2018)   Exercise Vital Sign    Days of Exercise per Week: 0 days    Minutes of Exercise per Session: 0 min  Stress: No Stress Concern Present (02/11/2018)   [employer] of Occupational Health - Occupational Stress Questionnaire    Feeling of Stress : Only a little  Social Connections: Not on file  Intimate Partner Violence: Not At Risk (02/11/2018)   Humiliation, Afraid, Rape, and Kick questionnaire    Fear of Current or Ex-Partner: No    Emotionally Abused: No    Physically Abused: No    Sexually Abused: No     Constitutional: Denies fever, malaise, fatigue, headache or abrupt weight changes.  HEENT: Denies eye pain, eye redness, ear pain, ringing in the ears, wax buildup, runny nose, nasal congestion, bloody nose, or sore throat. Respiratory: Denies difficulty breathing, shortness of breath, cough or sputum production.   Cardiovascular: Denies chest pain, chest tightness, palpitations or swelling in the hands or feet.  Gastrointestinal: Denies abdominal pain, bloating, constipation, diarrhea or blood in the stool.  GU: Denies urgency, frequency, pain with urination, burning sensation, blood in urine, odor or discharge. Musculoskeletal: Patient reports right calf pain.  Denies decrease in range of motion, difficulty with gait, or joint pain and swelling.  Skin: Denies redness, rashes, lesions or ulcercations.  Neurological: Pt reports insomnia. Denies dizziness, difficulty with memory, difficulty with speech or problems with balance and coordination.  Psych: Pt  has a history of anxiety. Denies depression, SI/HI.  No other specific complaints in a complete review of systems (except as listed in HPI above).  Objective:   Physical Exam  BP 136/86 (BP Location: Left Arm,  Patient Position: Sitting, Cuff Size: Normal)   Pulse 67   Temp (!) 96.9 F (36.1 C) (Temporal)   Wt 186 lb (84.4 kg)   SpO2 98%   BMI 35.14 kg/m   Wt Readings from Last 3 Encounters:  11/02/21 187 lb (84.8 kg)  10/19/21 188 lb (85.3 kg)  09/27/21 188 lb (85.3 kg)    General: Appears her stated age, obese, in NAD. Skin: Warm, dry and intact.  Sun damage skin noted. HEENT: Head: normal shape and size; Eyes: sclera white, no icterus, conjunctiva pink, PERRLA and EOMs intact;  Cardiovascular: Normal rate and rhythm. S1,S2 noted.  No murmur, rubs or gallops noted. No JVD or BLE edema. No carotid bruits noted. Pulmonary/Chest: Normal effort and positive vesicular breath sounds. No respiratory distress. No wheezes, rales or ronchi noted.  Abdomen: Soft and nontender. Normal bowel sounds.  Musculoskeletal: No pain with palpation of the right calf.  No swelling noted.  Strength 5/5 BLE.  No difficulty with gait.  Neurological: Alert and oriented. Coordination normal.  Psychiatric: Mood and affect normal. Behavior is normal. Judgment and thought content normal.    BMET    Component Value Date/Time   NA 139 08/22/2021 0833   NA 144 03/18/2018 0922   NA 140 06/16/2013 1919   K 3.8 08/22/2021 0833   K 3.5 06/16/2013 1919   CL 106 08/22/2021 0833   CL 107 06/16/2013 1919   CO2 26 08/22/2021 0833   CO2 27 06/16/2013 1919   GLUCOSE 81 08/22/2021 0833   GLUCOSE 127 (H) 06/16/2013 1919   BUN 11 08/22/2021 0833   BUN 11 03/18/2018 0922   BUN 13 06/16/2013 1919   CREATININE 0.87 08/22/2021 0833   CALCIUM 9.2 08/22/2021 0833   CALCIUM 8.7 06/16/2013 1919   GFRNONAA 88 09/01/2020 0753   GFRAA 102 09/01/2020 0753    Lipid Panel     Component Value Date/Time   CHOL 302 (H) 12/06/2021 1415   CHOL 300 (H) 01/02/2018 1030   TRIG 321 (H) 12/06/2021 1415   HDL 35 (L) 12/06/2021 1415   HDL 38 (L) 01/02/2018 1030   CHOLHDL 8.6 (H) 12/06/2021 1415   LDLCALC 213 (H) 12/06/2021 1415     CBC    Component Value Date/Time   WBC 7.6 08/22/2021 0833   RBC 4.88 08/22/2021 0833   HGB 14.4 08/22/2021 0833   HGB 14.9 03/13/2017 0843   HCT 42.9 08/22/2021 0833   HCT 44.7 03/13/2017 0843   PLT 270 08/22/2021 0833   PLT 291 03/13/2017 0843   MCV 87.9 08/22/2021 0833   MCV 91 03/13/2017 0843   MCV 88 06/16/2013 1919   MCH 29.5 08/22/2021 0833   MCHC 33.6 08/22/2021 0833   RDW 12.9 08/22/2021 0833   RDW 13.3 03/13/2017 0843   RDW 12.9 06/16/2013 1919   LYMPHSABS 2,594 05/26/2019 0908   LYMPHSABS 2.9 03/13/2017 0843   EOSABS 254 05/26/2019 0908   EOSABS 0.2 03/13/2017 0843   BASOSABS 85 05/26/2019 0908   BASOSABS 0.1 03/13/2017 0843    Hgb A1C Lab Results  Component Value Date   HGBA1C 4.8 09/01/2020           Assessment & Plan:   Right Calf Pain:  Discussed DDx which include calf strain, claudication, DVT, varicose veins, myalgias from statin Exam most consistent with calf strain Rx for Pred taper x 6 days Encouraged heat and stretching If symptoms do not improve, would recommend holding rosuvastatin to see if symptoms resolve   RTC in 1 month for annual exam Nicki Reaper, NP

## 2022-07-29 NOTE — Assessment & Plan Note (Signed)
Controlled on amlodipine-olmesartan and hydralazine Reinforced DASH diet

## 2022-07-29 NOTE — Assessment & Plan Note (Signed)
Continue fluoxetine Support offered 

## 2022-07-29 NOTE — Assessment & Plan Note (Signed)
Encourage weight loss as this can reduce sleep apnea symptoms Noncompliant with CPAP 

## 2022-07-29 NOTE — Assessment & Plan Note (Signed)
Continue Ambien as needed 

## 2022-07-29 NOTE — Assessment & Plan Note (Signed)
Avoid foods that trigger reflux Encourage weight loss as this can help reduce reflux symptoms Okay to take Tums OTC as needed 

## 2022-07-29 NOTE — Patient Instructions (Signed)
Medial Head Gastrocnemius Tear Rehab Ask your health care provider which exercises are safe for you. Do exercises exactly as told by your health care provider and adjust them as directed. It is normal to feel mild stretching, pulling, tightness, or discomfort as you do these exercises. Stop right away if you feel sudden pain or your pain gets worse. Do not begin these exercises until told by your health care provider. Stretching and range-of-motion exercises These exercises warm up your muscles and joints and improve the movement and flexibility of your lower leg. These exercises also help to relieve pain and stiffness. Gastrocnemius stretch This exercise is also called a calf stretch. It stretches the muscles in the back of the lower leg (gastrocnemius). Sit with your left / right leg extended. Loop a belt or towel around the ball of your left / right foot. The ball of your foot is on the walking surface, right under your toes. Hold both ends of the belt or towel. Keep your left / right ankle and foot relaxed and keep your knee straight while you use the belt or towel to pull your foot and ankle toward you. Stop at the first point of resistance. Hold this position for __________ seconds. Repeat __________ times. Complete this exercise __________ times a day. Ankle alphabet  Sit with your left / right leg supported at the lower leg. Do not rest your foot on anything. Make sure your foot has room to move freely. Think of your left / right foot as a paintbrush. Move your foot to trace each letter of the alphabet in the air. Keep your hip and knee still while you trace. Make the letters as large as you can without feeling discomfort. Trace every letter of the alphabet. Repeat __________ times. Complete this exercise __________ times a day. Strengthening exercises These exercises build strength and endurance in your lower leg. Endurance is the ability to use your muscles for a long time, even  after they get tired. Plantar flexion with band, seated  Sit on the floor with your left / right leg extended. Loop a rubber exercise band or tube around the ball of your left / right foot. The ball of your foot is on the walking surface, right under your toes. The band or tube should be slightly tense when your foot is relaxed. If the band or tube slips, you can put on your shoe or put a washcloth between the band and your foot to help it stay in place. While holding both ends of the band or tube, slowly point your toes downward, pushing them away from you (plantar flexion). Hold this position for __________ seconds. Slowly release the tension in the band or tube, controlling smoothly until your foot is back to the starting position. Repeat __________ times. Complete this exercise __________ times a day. Plantar flexion, standing  Stand with your feet shoulder-width apart. Place your hands on a wall or table to steady yourself as needed, but try not to use it for support. Rise up on your toes (plantar flexion). If this exercise is too easy, try these options: Shift your weight toward your left / right leg until you feel challenged. If told by your health care provider, stand on your left / right foot only. Hold this position for __________ seconds. Repeat __________ times. Complete this exercise __________ times a day. Eccentric plantar flexion  Stand on the balls of your feet on the edge of a step. The ball of your foot is  on the walking surface, right under your toes. Do not put your heels on the step. For balance, rest your hands on the wall or on a railing. Try not to lean on it for support. Rise up onto the balls of your feet, using both legs to help. Keeping your heels up, slowly shift all of your weight to your left / right foot and lift your other foot off the step. Slowly lower your left / right heel so it drops below the level of the step. Lowering your heel under tension is  called eccentric plantar flexion. You will feel a slight stretch in your left / right calf. Put your other foot back onto the step before returning to the start position. Repeat __________ times. Complete this exercise __________ times a day. This information is not intended to replace advice given to you by your health care provider. Make sure you discuss any questions you have with your health care provider. Document Revised: 08/08/2020 Document Reviewed: 08/08/2020 Elsevier Patient Education  2024 [employer].

## 2022-07-29 NOTE — Assessment & Plan Note (Signed)
Encouraged diet and exercise for weight loss ?

## 2022-09-06 ENCOUNTER — Encounter: Payer: Managed Care, Other (non HMO) | Admitting: Internal Medicine

## 2023-01-28 DIAGNOSIS — L570 Actinic keratosis: Secondary | ICD-10-CM | POA: Diagnosis not present

## 2023-01-28 DIAGNOSIS — L821 Other seborrheic keratosis: Secondary | ICD-10-CM | POA: Diagnosis not present

## 2023-04-08 ENCOUNTER — Encounter: Payer: Self-pay | Admitting: Internal Medicine

## 2023-04-08 ENCOUNTER — Other Ambulatory Visit: Payer: Self-pay | Admitting: Internal Medicine

## 2023-04-08 ENCOUNTER — Ambulatory Visit (INDEPENDENT_AMBULATORY_CARE_PROVIDER_SITE_OTHER): Payer: No Typology Code available for payment source | Admitting: Internal Medicine

## 2023-04-08 VITALS — BP 142/88 | Ht 61.0 in | Wt 188.6 lb

## 2023-04-08 DIAGNOSIS — R739 Hyperglycemia, unspecified: Secondary | ICD-10-CM | POA: Diagnosis not present

## 2023-04-08 DIAGNOSIS — F419 Anxiety disorder, unspecified: Secondary | ICD-10-CM | POA: Diagnosis not present

## 2023-04-08 DIAGNOSIS — E66812 Obesity, class 2: Secondary | ICD-10-CM

## 2023-04-08 DIAGNOSIS — Z1211 Encounter for screening for malignant neoplasm of colon: Secondary | ICD-10-CM

## 2023-04-08 DIAGNOSIS — Z0001 Encounter for general adult medical examination with abnormal findings: Secondary | ICD-10-CM | POA: Diagnosis not present

## 2023-04-08 DIAGNOSIS — E782 Mixed hyperlipidemia: Secondary | ICD-10-CM

## 2023-04-08 DIAGNOSIS — Z6835 Body mass index (BMI) 35.0-35.9, adult: Secondary | ICD-10-CM

## 2023-04-08 DIAGNOSIS — Z1231 Encounter for screening mammogram for malignant neoplasm of breast: Secondary | ICD-10-CM

## 2023-04-08 DIAGNOSIS — N6489 Other specified disorders of breast: Secondary | ICD-10-CM

## 2023-04-08 MED ORDER — ROSUVASTATIN CALCIUM 20 MG PO TABS: 20.0000 mg | ORAL_TABLET | Freq: Every day | ORAL | 1 refills | Status: DC

## 2023-04-08 MED ORDER — HYDRALAZINE HCL 25 MG PO TABS: 25.0000 mg | ORAL_TABLET | Freq: Two times a day (BID) | ORAL | 1 refills | Status: DC

## 2023-04-08 MED ORDER — AMLODIPINE-OLMESARTAN 10-20 MG PO TABS: 1.0000 | ORAL_TABLET | Freq: Every day | ORAL | 1 refills | Status: DC

## 2023-04-08 MED ORDER — FLUOXETINE HCL 10 MG PO CAPS: 20.0000 mg | ORAL_CAPSULE | Freq: Every day | ORAL | 1 refills | Status: DC

## 2023-04-08 NOTE — Progress Notes (Signed)
Subjective:    Patient ID: [name], female    DOB: [birth year], 58 y.o.   MRN: [id number]  HPI  Patient presents to clinic today for her annual exam.  Flu: never Tetanus: 01/2018 COVID: Pfizer x2 Shingrix: Never Pap smear: 05/2019 Mammogram: 03/2022 Colon screening: 08/2021, Cologuard Vision screening: as needed Dentist: biannually  Diet: She does eat meat. She consumes some fruits and veggies. She does eat fried foods. She drinks mostly DT. Coke. Exercise: None  Review of Systems  Past Medical History:  Diagnosis Date   Anxiety    Fatigue    Hyperlipemia    Irregular menstrual cycle    Obesity     Current Outpatient Medications  Medication Sig Dispense Refill   amlodipine-olmesartan (AZOR) 10-20 MG tablet Take 1 tablet by mouth daily. 90 tablet 0   cyclobenzaprine (FLEXERIL) 5 MG tablet Take 1 tablet (5 mg total) by mouth 3 (three) times daily as needed for muscle spasms. 30 tablet 0   FLUoxetine (PROZAC) 10 MG capsule TAKE 2 CAPSULES BY MOUTH DAILY 180 capsule 0   hydrALAZINE (APRESOLINE) 25 MG tablet Take 1 tablet (25 mg total) by mouth 2 (two) times daily. 180 tablet 0   meloxicam (MOBIC) 15 MG tablet TAKE 1 TABLET (15 MG TOTAL) BY MOUTH DAILY. 30 tablet 0   predniSONE (DELTASONE) 10 MG tablet Take 6 tabs on day 1, 5 tabs on day 2, 4 tabs on day 3, 3 tabs on day 4, 2 tabs on day 5, 1 tab on day 6 21 tablet 0   rosuvastatin (CRESTOR) 20 MG tablet TAKE 1 TABLET BY MOUTH EVERY DAY 90 tablet 0   zolpidem (AMBIEN) 5 MG tablet Take 1 tablet (5 mg total) by mouth at bedtime as needed for sleep. 30 tablet 0   No current facility-administered medications for this visit.    No Known Allergies  Family History  Problem Relation Age of Onset   Hypertension Mother    Heart disease Father    Hypertension Father    Thyroid disease Father    Heart attack Father 64   CAD Father    Kidney failure Father    Heart failure Maternal Grandmother    Lung cancer Maternal  Grandfather    Cancer Paternal Grandmother    Coronary artery disease Paternal Grandmother    Cancer Paternal Grandfather    Diabetes Neg Hx    Breast cancer Neg Hx    Colon cancer Neg Hx     Social History   Socioeconomic History   Marital status: Married    Spouse name: Not on file   Number of children: Not on file   Years of education: Not on file   Highest education level: High school graduate  Occupational History   Not on file  Tobacco Use   Smoking status: Every Day    Current packs/day: 1.50    Average packs/day: 1.5 packs/day for 30.0 years (45.1 ttl pk-yrs)    Types: Cigarettes    Start date: 03/23/1993   Smokeless tobacco: Never  Vaping Use   Vaping status: Never Used  Substance and Sexual Activity   Alcohol use: No    Alcohol/week: 0.0 standard drinks of alcohol   Drug use: No   Sexual activity: Yes    Birth control/protection: Post-menopausal  Other Topics Concern   Not on file  Social History Narrative   Not on file   Social Drivers of Health   Financial Resource Strain: Low Risk  (  02/11/2018)   Overall Financial Resource Strain (CARDIA)    Difficulty of Paying Living Expenses: Not very hard  Food Insecurity: No Food Insecurity (02/11/2018)   Hunger Vital Sign    Worried About Running Out of Food in the Last Year: Never true    Ran Out of Food in the Last Year: Never true  Transportation Needs: No Transportation Needs (02/11/2018)   PRAPARE - Administrator, Civil Service (Medical): No    Lack of Transportation (Non-Medical): No  Physical Activity: Inactive (02/11/2018)   Exercise Vital Sign    Days of Exercise per Week: 0 days    Minutes of Exercise per Session: 0 min  Stress: No Stress Concern Present (02/11/2018)   [employer] of Occupational Health - Occupational Stress Questionnaire    Feeling of Stress : Only a little  Social Connections: Not on file  Intimate Partner Violence: Not At Risk (02/11/2018)   Humiliation,  Afraid, Rape, and Kick questionnaire    Fear of Current or Ex-Partner: No    Emotionally Abused: No    Physically Abused: No    Sexually Abused: No     Constitutional: Denies fever, malaise, fatigue, headache or abrupt weight changes.  HEENT: Denies eye pain, eye redness, ear pain, wax buildup, runny nose, nasal congestion, bloody nose, or sore throat. Respiratory: Denies difficulty breathing, shortness of breath, cough or sputum production.   Cardiovascular: Denies chest pain, chest tightness, palpitations or swelling in the hands or feet.  Gastrointestinal: Pt reports intermittent reflux. Denies abdominal pain, bloating, constipation, diarrhea or blood in the stool.  GU: Denies urgency, frequency, pain with urination, burning sensation, blood in urine, odor or discharge. Musculoskeletal: Denies decrease in range of motion, difficulty with gait, muscle pain or joint pain and swelling.  Skin: Denies redness, rashes, lesions or ulcercations.  Neurological: Patient reports insomnia, dizziness.  Denies difficulty with memory, difficulty with speech or problems with balance and coordination.  Psych: Patient has a history of anxiety.  Denies depression, SI/HI.  No other specific complaints in a complete review of systems (except as listed in HPI above).     Objective:   Physical Exam BP (!) 142/88 (BP Location: Right Arm, Patient Position: Sitting, Cuff Size: Normal)   Ht 5\' 1"  (1.549 m)   Wt 188 lb 9.6 oz (85.5 kg)   BMI 35.64 kg/m    Wt Readings from Last 3 Encounters:  07/29/22 186 lb (84.4 kg)  11/02/21 187 lb (84.8 kg)  10/19/21 188 lb (85.3 kg)    General: Appears her stated age, obese, in NAD. Skin: Warm, dry and intact.  HEENT: Head: normal shape and size; Eyes: sclera white, no icterus, conjunctiva pink, PERRLA and EOMs intact;  Neck:  Neck supple, trachea midline. No masses, lumps or thyromegaly present.  Cardiovascular: Normal rate and rhythm. S1,S2 noted.  No murmur,  rubs or gallops noted. No JVD or BLE edema. No carotid bruits noted. Pulmonary/Chest: Normal effort and positive vesicular breath sounds. No respiratory distress. No wheezes, rales or ronchi noted.  Abdomen: Soft and nontender. Normal bowel sounds.  Musculoskeletal: Strength 5/5 BUE/BLE. No difficulty with gait.  Neurological: Alert and oriented. Cranial nerves II-XII grossly intact. Coordination normal.  Psychiatric: Mood and affect normal. Behavior is normal. Judgment and thought content normal.    BMET    Component Value Date/Time   NA 139 08/22/2021 0833   NA 144 03/18/2018 0922   NA 140 06/16/2013 1919   K 3.8 08/22/2021 1610  K 3.5 06/16/2013 1919   CL 106 08/22/2021 0833   CL 107 06/16/2013 1919   CO2 26 08/22/2021 0833   CO2 27 06/16/2013 1919   GLUCOSE 81 08/22/2021 0833   GLUCOSE 127 (H) 06/16/2013 1919   BUN 11 08/22/2021 0833   BUN 11 03/18/2018 0922   BUN 13 06/16/2013 1919   CREATININE 0.87 08/22/2021 0833   CALCIUM 9.2 08/22/2021 0833   CALCIUM 8.7 06/16/2013 1919   GFRNONAA 88 09/01/2020 0753   GFRAA 102 09/01/2020 0753    Lipid Panel     Component Value Date/Time   CHOL 302 (H) 12/06/2021 1415   CHOL 300 (H) 01/02/2018 1030   TRIG 321 (H) 12/06/2021 1415   HDL 35 (L) 12/06/2021 1415   HDL 38 (L) 01/02/2018 1030   CHOLHDL 8.6 (H) 12/06/2021 1415   LDLCALC 213 (H) 12/06/2021 1415    CBC    Component Value Date/Time   WBC 7.6 08/22/2021 0833   RBC 4.88 08/22/2021 0833   HGB 14.4 08/22/2021 0833   HGB 14.9 03/13/2017 0843   HCT 42.9 08/22/2021 0833   HCT 44.7 03/13/2017 0843   PLT 270 08/22/2021 0833   PLT 291 03/13/2017 0843   MCV 87.9 08/22/2021 0833   MCV 91 03/13/2017 0843   MCV 88 06/16/2013 1919   MCH 29.5 08/22/2021 0833   MCHC 33.6 08/22/2021 0833   RDW 12.9 08/22/2021 0833   RDW 13.3 03/13/2017 0843   RDW 12.9 06/16/2013 1919   LYMPHSABS 2,594 05/26/2019 0908   LYMPHSABS 2.9 03/13/2017 0843   EOSABS 254 05/26/2019 0908   EOSABS  0.2 03/13/2017 0843   BASOSABS 85 05/26/2019 0908   BASOSABS 0.1 03/13/2017 0843    Hgb A1C Lab Results  Component Value Date   HGBA1C 4.8 09/01/2020            Assessment & Plan:   Preventative Health Maintenance:  She declines flu shot Tetanus UTD Encouraged her to get her COVID booster Discussed Shingrix vaccine, she will check coverage with her insurance company and schedule a nurse visit if she would like to have this done Pap smear UTD Mammogram ordered-she will call to schedule Cologuard UTD Encouraged her to consume a balanced diet and exercise regimen Advised her to see an eye doctor and dentist annually We will check CBC, c-Met, lipid profile and A1C today  RTC in 6 months, follow-up chronic conditions Nicki Reaper, NP

## 2023-04-08 NOTE — Assessment & Plan Note (Signed)
Encouraged diet and exercise for weight loss ?

## 2023-04-08 NOTE — Patient Instructions (Signed)
Health Maintenance for Postmenopausal Women Menopause is a normal process in which your ability to get pregnant comes to an end. This process happens slowly over many months or years, usually between the ages of 24 and 62. Menopause is complete when you have missed your menstrual period for 12 months. It is important to talk with your health care provider about some of the most common conditions that affect women after menopause (postmenopausal women). These include heart disease, cancer, and bone loss (osteoporosis). Adopting a healthy lifestyle and getting preventive care can help to promote your health and wellness. The actions you take can also lower your chances of developing some of these common conditions. What are the signs and symptoms of menopause? During menopause, you may have the following symptoms: Hot flashes. These can be moderate or severe. Night sweats. Decrease in sex drive. Mood swings. Headaches. Tiredness (fatigue). Irritability. Memory problems. Problems falling asleep or staying asleep. Talk with your health care provider about treatment options for your symptoms. Do I need hormone replacement therapy? Hormone replacement therapy is effective in treating symptoms that are caused by menopause, such as hot flashes and night sweats. Hormone replacement carries certain risks, especially as you become older. If you are thinking about using estrogen or estrogen with progestin, discuss the benefits and risks with your health care provider. How can I reduce my risk for heart disease and stroke? The risk of heart disease, heart attack, and stroke increases as you age. One of the causes may be a change in the body's hormones during menopause. This can affect how your body uses dietary fats, triglycerides, and cholesterol. Heart attack and stroke are medical emergencies. There are many things that you can do to help prevent heart disease and stroke. Watch your blood pressure High  blood pressure causes heart disease and increases the risk of stroke. This is more likely to develop in people who have high blood pressure readings or are overweight. Have your blood pressure checked: Every 3-5 years if you are 50-75 years of age. Every year if you are 77 years old or older. Eat a healthy diet  Eat a diet that includes plenty of vegetables, fruits, low-fat dairy products, and lean protein. Do not eat a lot of foods that are high in solid fats, added sugars, or sodium. Get regular exercise Get regular exercise. This is one of the most important things you can do for your health. Most adults should: Try to exercise for at least 150 minutes each week. The exercise should increase your heart rate and make you sweat (moderate-intensity exercise). Try to do strengthening exercises at least twice each week. Do these in addition to the moderate-intensity exercise. Spend less time sitting. Even light physical activity can be beneficial. Other tips Work with your health care provider to achieve or maintain a healthy weight. Do not use any products that contain nicotine or tobacco. These products include cigarettes, chewing tobacco, and vaping devices, such as e-cigarettes. If you need help quitting, ask your health care provider. Know your numbers. Ask your health care provider to check your cholesterol and your blood sugar (glucose). Continue to have your blood tested as directed by your health care provider. Do I need screening for cancer? Depending on your health history and family history, you may need to have cancer screenings at different stages of your life. This may include screening for: Breast cancer. Cervical cancer. Lung cancer. Colorectal cancer. What is my risk for osteoporosis? After menopause, you may be  at increased risk for osteoporosis. Osteoporosis is a condition in which bone destruction happens more quickly than new bone creation. To help prevent osteoporosis or  the bone fractures that can happen because of osteoporosis, you may take the following actions: If you are 61-3 years old, get at least 1,000 mg of calcium and at least 600 international units (IU) of vitamin D per day. If you are older than age 61 but younger than age 75, get at least 1,200 mg of calcium and at least 600 international units (IU) of vitamin D per day. If you are older than age 62, get at least 1,200 mg of calcium and at least 800 international units (IU) of vitamin D per day. Smoking and drinking excessive alcohol increase the risk of osteoporosis. Eat foods that are rich in calcium and vitamin D, and do weight-bearing exercises several times each week as directed by your health care provider. How does menopause affect my mental health? Depression may occur at any age, but it is more common as you become older. Common symptoms of depression include: Feeling depressed. Changes in sleep patterns. Changes in appetite or eating patterns. Feeling an overall lack of motivation or enjoyment of activities that you previously enjoyed. Frequent crying spells. Talk with your health care provider if you think that you are experiencing any of these symptoms. General instructions See your health care provider for regular wellness exams and vaccines. This may include: Scheduling regular health, dental, and eye exams. Getting and maintaining your vaccines. These include: Influenza vaccine. Get this vaccine each year before the flu season begins. Pneumonia vaccine. Shingles vaccine. Tetanus, diphtheria, and pertussis (Tdap) booster vaccine. Your health care provider may also recommend other immunizations. Tell your health care provider if you have ever been abused or do not feel safe at home. Summary Menopause is a normal process in which your ability to get pregnant comes to an end. This condition causes hot flashes, night sweats, decreased interest in sex, mood swings, headaches, or lack  of sleep. Treatment for this condition may include hormone replacement therapy. Take actions to keep yourself healthy, including exercising regularly, eating a healthy diet, watching your weight, and checking your blood pressure and blood sugar levels. Get screened for cancer and depression. Make sure that you are up to date with all your vaccines. This information is not intended to replace advice given to you by your health care provider. Make sure you discuss any questions you have with your health care provider. Document Revised: 07/03/2020 Document Reviewed: 07/03/2020 Elsevier Patient Education  2024 [employer].

## 2023-04-09 ENCOUNTER — Encounter: Payer: Self-pay | Admitting: Internal Medicine

## 2023-04-09 DIAGNOSIS — E782 Mixed hyperlipidemia: Secondary | ICD-10-CM

## 2023-04-09 LAB — LIPID PANEL
Cholesterol: 401 mg/dL — ABNORMAL HIGH (ref <200)
HDL: 40 mg/dL — ABNORMAL LOW (ref >=50)
LDL Cholesterol (Calc): 297 mg/dL — ABNORMAL HIGH
Non-HDL Cholesterol (Calc): 361 mg/dL — ABNORMAL HIGH (ref <130)
Total CHOL/HDL Ratio: 10 (calc) — ABNORMAL HIGH (ref <5.0)
Triglycerides: 376 mg/dL — ABNORMAL HIGH (ref <150)

## 2023-04-09 LAB — CBC
HCT: 43.4 % (ref 35.0–45.0)
Hemoglobin: 14.3 g/dL (ref 11.7–15.5)
MCH: 29.2 pg (ref 27.0–33.0)
MCHC: 32.9 g/dL (ref 32.0–36.0)
MCV: 88.6 fL (ref 80.0–100.0)
MPV: 10.4 fL (ref 7.5–12.5)
Platelets: 311 10*3/uL (ref 140–400)
RBC: 4.9 10*6/uL (ref 3.80–5.10)
RDW: 12.7 % (ref 11.0–15.0)
WBC: 12.4 10*3/uL — ABNORMAL HIGH (ref 3.8–10.8)

## 2023-04-09 LAB — COMPLETE METABOLIC PANEL WITH GFR
AG Ratio: 1.9 (calc) (ref 1.0–2.5)
ALT: 19 U/L (ref 6–29)
AST: 17 U/L (ref 10–35)
Albumin: 4.6 g/dL (ref 3.6–5.1)
Alkaline phosphatase (APISO): 109 U/L (ref 37–153)
BUN: 13 mg/dL (ref 7–25)
CO2: 26 mmol/L (ref 20–32)
Calcium: 9.4 mg/dL (ref 8.6–10.4)
Chloride: 104 mmol/L (ref 98–110)
Creat: 0.95 mg/dL (ref 0.50–1.03)
Globulin: 2.4 g/dL (ref 1.9–3.7)
Glucose, Bld: 84 mg/dL (ref 65–139)
Potassium: 3.9 mmol/L (ref 3.5–5.3)
Sodium: 138 mmol/L (ref 135–146)
Total Bilirubin: 0.3 mg/dL (ref 0.2–1.2)
Total Protein: 7 g/dL (ref 6.1–8.1)
eGFR: 70 mL/min/{1.73_m2} (ref >=60)

## 2023-04-09 LAB — HEMOGLOBIN A1C
Hgb A1c MFr Bld: 5.1 %{Hb} (ref <5.7)
Mean Plasma Glucose: 100 mg/dL
eAG (mmol/L): 5.5 mmol/L

## 2023-04-15 ENCOUNTER — Ambulatory Visit: Payer: No Typology Code available for payment source | Admitting: Internal Medicine

## 2023-04-15 ENCOUNTER — Encounter: Payer: Self-pay | Admitting: Internal Medicine

## 2023-04-15 VITALS — BP 148/76 | Ht 61.0 in | Wt 188.0 lb

## 2023-04-15 DIAGNOSIS — H6992 Unspecified Eustachian tube disorder, left ear: Secondary | ICD-10-CM | POA: Diagnosis not present

## 2023-04-15 DIAGNOSIS — R519 Headache, unspecified: Secondary | ICD-10-CM | POA: Diagnosis not present

## 2023-04-15 MED ORDER — METHYLPREDNISOLONE ACETATE 80 MG/ML IJ SUSP
80.0000 mg | Freq: Once | INTRAMUSCULAR | Status: AC
  Administered 2023-04-15: 80 mg via INTRAMUSCULAR

## 2023-04-15 NOTE — Progress Notes (Signed)
Subjective:    Patient ID: [name], female    DOB: 07/16/1965, 58 y.o.   MRN: [id number]  HPI  Discussed the use of AI scribe software for clinical note transcription with the patient, who gave verbal consent to proceed.   [name] is a 58 year old female who presents with left ear pain radiating to the neck.  She has been experiencing pain in her left ear for the past two days. The pain originates behind the ear and extends upwards and downwards, causing discomfort with head movements. She is unsure if the pain is more behind the ear or within the ear itself. The pain radiates to the neck, described as a constant ache that occurs intermittently. No ear drainage, hearing problems, or pain in front of the ear. No runny nose, nasal congestion, sore throat, rashes, or redness in the area. The area is not sensitive to touch.  She has taken Tylenol for the pain, but it did not provide relief.       Review of Systems  Past Medical History:  Diagnosis Date   Anxiety    Fatigue    Hyperlipemia    Irregular menstrual cycle    Obesity     Current Outpatient Medications  Medication Sig Dispense Refill   amlodipine-olmesartan (AZOR) 10-20 MG tablet Take 1 tablet by mouth daily. 90 tablet 1   FLUoxetine (PROZAC) 10 MG capsule Take 2 capsules (20 mg total) by mouth daily. 180 capsule 1   hydrALAZINE (APRESOLINE) 25 MG tablet Take 1 tablet (25 mg total) by mouth 2 (two) times daily. 180 tablet 1   rosuvastatin (CRESTOR) 20 MG tablet Take 1 tablet (20 mg total) by mouth daily. 90 tablet 1   zolpidem (AMBIEN) 5 MG tablet Take 1 tablet (5 mg total) by mouth at bedtime as needed for sleep. (Patient not taking: Reported on 04/08/2023) 30 tablet 0   No current facility-administered medications for this visit.    No Known Allergies  Family History  Problem Relation Age of Onset   Hypertension Mother    Heart disease Father    Hypertension Father    Thyroid disease Father    Heart  attack Father 50   CAD Father    Kidney failure Father    Heart failure Maternal Grandmother    Lung cancer Maternal Grandfather    Cancer Paternal Grandmother    Coronary artery disease Paternal Grandmother    Cancer Paternal Grandfather    Diabetes Neg Hx    Breast cancer Neg Hx    Colon cancer Neg Hx     Social History   Socioeconomic History   Marital status: Married    Spouse name: Not on file   Number of children: Not on file   Years of education: Not on file   Highest education level: High school graduate  Occupational History   Not on file  Tobacco Use   Smoking status: Every Day    Current packs/day: 1.50    Average packs/day: 1.5 packs/day for 30.1 years (45.1 ttl pk-yrs)    Types: Cigarettes    Start date: 03/23/1993   Smokeless tobacco: Never  Vaping Use   Vaping status: Never Used  Substance and Sexual Activity   Alcohol use: No    Alcohol/week: 0.0 standard drinks of alcohol   Drug use: No   Sexual activity: Yes    Birth control/protection: Post-menopausal  Other Topics Concern   Not on file  Social History  Narrative   Not on file   Social Drivers of Health   Financial Resource Strain: Low Risk  (02/11/2018)   Overall Financial Resource Strain (CARDIA)    Difficulty of Paying Living Expenses: Not very hard  Food Insecurity: No Food Insecurity (02/11/2018)   Hunger Vital Sign    Worried About Running Out of Food in the Last Year: Never true    Ran Out of Food in the Last Year: Never true  Transportation Needs: No Transportation Needs (02/11/2018)   PRAPARE - Administrator, Civil Service (Medical): No    Lack of Transportation (Non-Medical): No  Physical Activity: Inactive (02/11/2018)   Exercise Vital Sign    Days of Exercise per Week: 0 days    Minutes of Exercise per Session: 0 min  Stress: No Stress Concern Present (02/11/2018)   [employer] of Occupational Health - Occupational Stress Questionnaire    Feeling of Stress :  Only a little  Social Connections: Not on file  Intimate Partner Violence: Not At Risk (02/11/2018)   Humiliation, Afraid, Rape, and Kick questionnaire    Fear of Current or Ex-Partner: No    Emotionally Abused: No    Physically Abused: No    Sexually Abused: No     Constitutional: Denies fever, malaise, fatigue, headache or abrupt weight changes.  HEENT: Patient reports left ear pain.  Denies eye pain, eye redness, wax buildup, runny nose, nasal congestion, bloody nose, or sore throat. Respiratory: Denies difficulty breathing, shortness of breath, cough or sputum production.   Cardiovascular: Denies chest pain, chest tightness, palpitations or swelling in the hands or feet.  Neurological: Patient reports insomnia, dizziness.  Denies difficulty with memory, difficulty with speech or problems with balance and coordination.   No other specific complaints in a complete review of systems (except as listed in HPI above).     Objective:   Physical Exam BP (!) 148/76   Ht 5\' 1"  (1.549 m)   Wt 188 lb (85.3 kg)   BMI 35.52 kg/m     Wt Readings from Last 3 Encounters:  04/08/23 188 lb 9.6 oz (85.5 kg)  07/29/22 186 lb (84.4 kg)  11/02/21 187 lb (84.8 kg)    General: Appears her stated age, obese, in NAD. Skin: Warm, dry and intact.  HEENT: Head: normal shape and size; Eyes: sclera white, no icterus, conjunctiva pink, PERRLA and EOMs intact; Left Ear: Pain with palpation over the mastoid.  No pain with pulling on pinna or palpation of the tragus.  TM gray and intact, normal light reflex, positive serous effusion on the left Neck: No adenopathy noted. Cardiovascular: Normal rate and rhythm.  Neurological: Alert and oriented.   BMET    Component Value Date/Time   NA 138 04/08/2023 1515   NA 144 03/18/2018 0922   NA 140 06/16/2013 1919   K 3.9 04/08/2023 1515   K 3.5 06/16/2013 1919   CL 104 04/08/2023 1515   CL 107 06/16/2013 1919   CO2 26 04/08/2023 1515   CO2 27 06/16/2013  1919   GLUCOSE 84 04/08/2023 1515   GLUCOSE 127 (H) 06/16/2013 1919   BUN 13 04/08/2023 1515   BUN 11 03/18/2018 0922   BUN 13 06/16/2013 1919   CREATININE 0.95 04/08/2023 1515   CALCIUM 9.4 04/08/2023 1515   CALCIUM 8.7 06/16/2013 1919   GFRNONAA 88 09/01/2020 0753   GFRAA 102 09/01/2020 0753    Lipid Panel     Component Value Date/Time  CHOL 401 (H) 04/08/2023 1515   CHOL 300 (H) 01/02/2018 1030   TRIG 376 (H) 04/08/2023 1515   HDL 40 (L) 04/08/2023 1515   HDL 38 (L) 01/02/2018 1030   CHOLHDL 10.0 (H) 04/08/2023 1515   LDLCALC 297 (H) 04/08/2023 1515    CBC    Component Value Date/Time   WBC 12.4 (H) 04/08/2023 1515   RBC 4.90 04/08/2023 1515   HGB 14.3 04/08/2023 1515   HGB 14.9 03/13/2017 0843   HCT 43.4 04/08/2023 1515   HCT 44.7 03/13/2017 0843   PLT 311 04/08/2023 1515   PLT 291 03/13/2017 0843   MCV 88.6 04/08/2023 1515   MCV 91 03/13/2017 0843   MCV 88 06/16/2013 1919   MCH 29.2 04/08/2023 1515   MCHC 32.9 04/08/2023 1515   RDW 12.7 04/08/2023 1515   RDW 13.3 03/13/2017 0843   RDW 12.9 06/16/2013 1919   LYMPHSABS 2,594 05/26/2019 0908   LYMPHSABS 2.9 03/13/2017 0843   EOSABS 254 05/26/2019 0908   EOSABS 0.2 03/13/2017 0843   BASOSABS 85 05/26/2019 0908   BASOSABS 0.1 03/13/2017 0843    Hgb A1C Lab Results  Component Value Date   HGBA1C 5.1 04/08/2023            Assessment & Plan:    Assessment and Plan    Left Ear Pain Pain behind the left ear radiating down the neck for two days. No associated symptoms of infection or inflammation. Physical exam revealed clear fluid in the ear but no signs of infection. Possible early shingles without rash, but unclear. -Administer 80 mg Depo-Medrol IM today to decrease potential inflammation. -Start Flonase nasal spray, 1-2 times daily for the next three days. -If symptoms worsen or a rash develops, patient should contact the office immediately.       RTC in 6 months, follow-up chronic  conditions Nicki Reaper, NP

## 2023-04-15 NOTE — Patient Instructions (Signed)
 Eustachian Tube Dysfunction  Eustachian tube dysfunction refers to a condition in which a blockage develops in the narrow passage that connects the middle ear to the back of the nose (eustachian tube). The eustachian tube regulates air pressure in the middle ear by letting air move between the ear and nose. It also helps to drain fluid from the middle ear space. Eustachian tube dysfunction can affect one or both ears. When the eustachian tube does not function properly, air pressure, fluid, or both can build up in the middle ear. What are the causes? This condition occurs when the eustachian tube becomes blocked or cannot open normally. Common causes of this condition include: Ear infections. Colds and other infections that affect the nose, mouth, and throat (upper respiratory tract). Allergies. Irritation from cigarette smoke. Irritation from stomach acid coming up into the esophagus (gastroesophageal reflux). The esophagus is the part of the body that moves food from the mouth to the stomach. Sudden changes in air pressure, such as from descending in an airplane or scuba diving. Abnormal growths in the nose or throat, such as: Growths that line the nose (nasal polyps). Abnormal growth of cells (tumors). Enlarged tissue at the back of the throat (adenoids). What increases the risk? You are more likely to develop this condition if: You smoke. You are overweight. You are a child who has: Certain birth defects of the mouth, such as cleft palate. Large tonsils or adenoids. What are the signs or symptoms? Common symptoms of this condition include: A feeling of fullness in the ear. Ear pain. Clicking or popping noises in the ear. Ringing in the ear (tinnitus). Hearing loss. Loss of balance. Dizziness. Symptoms may get worse when the air pressure around you changes, such as when you travel to an area of high elevation, fly on an airplane, or go scuba diving. How is this diagnosed? This  condition may be diagnosed based on: Your symptoms. A physical exam of your ears, nose, and throat. Tests, such as those that measure: The movement of your eardrum. Your hearing (audiometry). How is this treated? Treatment depends on the cause and severity of your condition. In mild cases, you may relieve your symptoms by moving air into your ears. This is called "popping the ears." In more severe cases, or if you have symptoms of fluid in your ears, treatment may include: Medicines to relieve congestion (decongestants). Medicines that treat allergies (antihistamines). Nasal sprays or ear drops that contain medicines that reduce swelling (steroids). A procedure to drain the fluid in your eardrum. In this procedure, a small tube may be placed in the eardrum to: Drain the fluid. Restore the air in the middle ear space. A procedure to insert a balloon device through the nose to inflate the opening of the eustachian tube (balloon dilation). Follow these instructions at home: Lifestyle Do not do any of the following until your health care provider approves: Travel to high altitudes. Fly in airplanes. Work in a Estate agent or room. Scuba dive. Do not use any products that contain nicotine or tobacco. These products include cigarettes, chewing tobacco, and vaping devices, such as e-cigarettes. If you need help quitting, ask your health care provider. Keep your ears dry. Wear fitted earplugs during showering and bathing. Dry your ears completely after. General instructions Take over-the-counter and prescription medicines only as told by your health care provider. Use techniques to help pop your ears as recommended by your health care provider. These may include: Chewing gum. Yawning. Frequent, forceful swallowing.  Closing your mouth, holding your nose closed, and gently blowing as if you are trying to blow air out of your nose. Keep all follow-up visits. This is important. Contact a  health care provider if: Your symptoms do not go away after treatment. Your symptoms come back after treatment. You are unable to pop your ears. You have: A fever. Pain in your ear. Pain in your head or neck. Fluid draining from your ear. Your hearing suddenly changes. You become very dizzy. You lose your balance. Get help right away if: You have a sudden, severe increase in any of your symptoms. Summary Eustachian tube dysfunction refers to a condition in which a blockage develops in the eustachian tube. It can be caused by ear infections, allergies, inhaled irritants, or abnormal growths in the nose or throat. Symptoms may include ear pain or fullness, hearing loss, or ringing in the ears. Mild cases are treated with techniques to unblock the ears, such as yawning or chewing gum. More severe cases are treated with medicines or procedures. This information is not intended to replace advice given to you by your health care provider. Make sure you discuss any questions you have with your health care provider. Document Revised: 04/24/2020 Document Reviewed: 04/24/2020 Elsevier Patient Education  2024 [employer].

## 2023-04-21 NOTE — Telephone Encounter (Unsigned)
 Copied from CRM [id number]. Topic: General - Billing Inquiry >> Apr 21, 2023  8:29 AM Gildardo Pounds wrote: Reason for CRM: Patient states that she did not miss her appointment on 04/15/2023 but MyChart shows a missed visit. Patient does not want to be charged for a missed visit. Callback number is [phone]

## 2023-04-24 ENCOUNTER — Encounter: Payer: Self-pay | Admitting: Internal Medicine

## 2023-04-24 ENCOUNTER — Ambulatory Visit (INDEPENDENT_AMBULATORY_CARE_PROVIDER_SITE_OTHER): Payer: No Typology Code available for payment source | Admitting: Internal Medicine

## 2023-04-24 VITALS — BP 138/74 | Ht 61.0 in | Wt 185.8 lb

## 2023-04-24 DIAGNOSIS — E66812 Obesity, class 2: Secondary | ICD-10-CM

## 2023-04-24 DIAGNOSIS — F419 Anxiety disorder, unspecified: Secondary | ICD-10-CM | POA: Diagnosis not present

## 2023-04-24 DIAGNOSIS — F5104 Psychophysiologic insomnia: Secondary | ICD-10-CM | POA: Diagnosis not present

## 2023-04-24 DIAGNOSIS — E782 Mixed hyperlipidemia: Secondary | ICD-10-CM

## 2023-04-24 DIAGNOSIS — I10 Essential (primary) hypertension: Secondary | ICD-10-CM

## 2023-04-24 DIAGNOSIS — Z6835 Body mass index (BMI) 35.0-35.9, adult: Secondary | ICD-10-CM

## 2023-04-24 MED ORDER — FLUOXETINE HCL 10 MG PO CAPS: 10.0000 mg | ORAL_CAPSULE | Freq: Every day | ORAL | Status: AC

## 2023-04-24 NOTE — Assessment & Plan Note (Signed)
 Will have her switch her rosuvastatin to morning time to see if this helps her insomnia.

## 2023-04-24 NOTE — Assessment & Plan Note (Signed)
 Will decrease fluoxetine to 10 mg daily Support offered

## 2023-04-24 NOTE — Patient Instructions (Signed)

## 2023-04-24 NOTE — Assessment & Plan Note (Signed)
 Encouraged diet and exercise for weight loss ?

## 2023-04-24 NOTE — Assessment & Plan Note (Signed)
 Will discontinue Ambien due to nonuse We will have her switch rosuvastatin to morning time to see if this helps her insomnia

## 2023-04-24 NOTE — Assessment & Plan Note (Signed)
 Controlled on amlodipine-olmesartan and hydralazine Will have her take her second dose of hydralazine at 2 to 3 PM instead of before bed and see if this helps with her insomnia Reinforced DASH diet and exercise for weight loss

## 2023-04-24 NOTE — Progress Notes (Signed)
 Subjective:    Patient ID: [name], female    DOB: 02-11-1966, 58 y.o.   MRN: [id number]  HPI  Patient presents to clinic today for 2-week follow-up of HTN.  She is currently taking amlodipine-olmesartan and hydralazine as prescribed.  Her BP today is 138/74.  ECG from 05/2016 reviewed.  She does feel like she is taking too much anxiety medicine.  She feels like it is "too much for her".  She is currently taking fluoxetine to 10 mg tablets daily in the morning.  She also reports she is having difficulty sleeping.  She is having difficulty falling and staying asleep.  She has tried Ambien in the past but did not like the way it made her feel.  She is concerned that her hydralazine and rosuvastatin may be interfering with her ability to sleep and she is wondering if she can take these at an alternate time of day.  Review of Systems  Past Medical History:  Diagnosis Date   Anxiety    Fatigue    Hyperlipemia    Irregular menstrual cycle    Obesity     Current Outpatient Medications  Medication Sig Dispense Refill   amlodipine-olmesartan (AZOR) 10-20 MG tablet Take 1 tablet by mouth daily. 90 tablet 1   FLUoxetine (PROZAC) 10 MG capsule Take 2 capsules (20 mg total) by mouth daily. 180 capsule 1   hydrALAZINE (APRESOLINE) 25 MG tablet Take 1 tablet (25 mg total) by mouth 2 (two) times daily. 180 tablet 1   rosuvastatin (CRESTOR) 20 MG tablet Take 1 tablet (20 mg total) by mouth daily. 90 tablet 1   zolpidem (AMBIEN) 5 MG tablet Take 1 tablet (5 mg total) by mouth at bedtime as needed for sleep. (Patient not taking: Reported on 04/15/2023) 30 tablet 0   No current facility-administered medications for this visit.    No Known Allergies  Family History  Problem Relation Age of Onset   Hypertension Mother    Heart disease Father    Hypertension Father    Thyroid disease Father    Heart attack Father 73   CAD Father    Kidney failure Father    Heart failure Maternal  Grandmother    Lung cancer Maternal Grandfather    Cancer Paternal Grandmother    Coronary artery disease Paternal Grandmother    Cancer Paternal Grandfather    Diabetes Neg Hx    Breast cancer Neg Hx    Colon cancer Neg Hx     Social History   Socioeconomic History   Marital status: Married    Spouse name: Not on file   Number of children: Not on file   Years of education: Not on file   Highest education level: High school graduate  Occupational History   Not on file  Tobacco Use   Smoking status: Every Day    Current packs/day: 1.50    Average packs/day: 1.5 packs/day for 30.1 years (45.1 ttl pk-yrs)    Types: Cigarettes    Start date: 03/23/1993   Smokeless tobacco: Never  Vaping Use   Vaping status: Never Used  Substance and Sexual Activity   Alcohol use: No    Alcohol/week: 0.0 standard drinks of alcohol   Drug use: No   Sexual activity: Yes    Birth control/protection: Post-menopausal  Other Topics Concern   Not on file  Social History Narrative   Not on file   Social Drivers of Health   Financial Resource Strain: Low  Risk  (02/11/2018)   Overall Financial Resource Strain (CARDIA)    Difficulty of Paying Living Expenses: Not very hard  Food Insecurity: No Food Insecurity (02/11/2018)   Hunger Vital Sign    Worried About Running Out of Food in the Last Year: Never true    Ran Out of Food in the Last Year: Never true  Transportation Needs: No Transportation Needs (02/11/2018)   PRAPARE - Administrator, Civil Service (Medical): No    Lack of Transportation (Non-Medical): No  Physical Activity: Inactive (02/11/2018)   Exercise Vital Sign    Days of Exercise per Week: 0 days    Minutes of Exercise per Session: 0 min  Stress: No Stress Concern Present (02/11/2018)   [employer] of Occupational Health - Occupational Stress Questionnaire    Feeling of Stress : Only a little  Social Connections: Not on file  Intimate Partner Violence: Not At  Risk (02/11/2018)   Humiliation, Afraid, Rape, and Kick questionnaire    Fear of Current or Ex-Partner: No    Emotionally Abused: No    Physically Abused: No    Sexually Abused: No     Constitutional: Denies fever, malaise, fatigue, headache or abrupt weight changes.  HEENT: Denies eye pain, eye redness, ear pain, wax buildup, runny nose, nasal congestion, bloody nose, or sore throat. Respiratory: Denies difficulty breathing, shortness of breath, cough or sputum production.   Cardiovascular: Denies chest pain, chest tightness, palpitations or swelling in the hands or feet.  Gastrointestinal: Pt reports intermittent reflux. Denies abdominal pain, bloating, constipation, diarrhea or blood in the stool.  GU: Denies urgency, frequency, pain with urination, burning sensation, blood in urine, odor or discharge. Musculoskeletal: Denies decrease in range of motion, difficulty with gait, muscle pain or joint pain and swelling.  Skin: Denies redness, rashes, lesions or ulcercations.  Neurological: Patient reports insomnia, dizziness.  Denies difficulty with memory, difficulty with speech or problems with balance and coordination.  Psych: Patient has a history of anxiety.  Denies depression, SI/HI.  No other specific complaints in a complete review of systems (except as listed in HPI above).     Objective:   Physical Exam BP 138/74 (BP Location: Right Arm, Patient Position: Sitting, Cuff Size: Normal)   Ht 5\' 1"  (1.549 m)   Wt 185 lb 12.8 oz (84.3 kg)   BMI 35.11 kg/m     Wt Readings from Last 3 Encounters:  04/15/23 188 lb (85.3 kg)  04/08/23 188 lb 9.6 oz (85.5 kg)  07/29/22 186 lb (84.4 kg)    General: Appears her stated age, obese, in NAD. Skin: Warm, dry and intact.  Cardiovascular: Normal rate and rhythm. S1,S2 noted.  No murmur, rubs or gallops noted. No JVD or BLE edema.  Pulmonary/Chest: Normal effort and positive vesicular breath sounds. No respiratory distress. No wheezes,  rales or ronchi noted.  Neurological: Alert and oriented. Coordination normal.  Psychiatric: Mood and affect normal. Behavior is normal. Judgment and thought content normal.    BMET    Component Value Date/Time   NA 138 04/08/2023 1515   NA 144 03/18/2018 0922   NA 140 06/16/2013 1919   K 3.9 04/08/2023 1515   K 3.5 06/16/2013 1919   CL 104 04/08/2023 1515   CL 107 06/16/2013 1919   CO2 26 04/08/2023 1515   CO2 27 06/16/2013 1919   GLUCOSE 84 04/08/2023 1515   GLUCOSE 127 (H) 06/16/2013 1919   BUN 13 04/08/2023 1515   BUN 11  03/18/2018 0922   BUN 13 06/16/2013 1919   CREATININE 0.95 04/08/2023 1515   CALCIUM 9.4 04/08/2023 1515   CALCIUM 8.7 06/16/2013 1919   GFRNONAA 88 09/01/2020 0753   GFRAA 102 09/01/2020 0753    Lipid Panel     Component Value Date/Time   CHOL 401 (H) 04/08/2023 1515   CHOL 300 (H) 01/02/2018 1030   TRIG 376 (H) 04/08/2023 1515   HDL 40 (L) 04/08/2023 1515   HDL 38 (L) 01/02/2018 1030   CHOLHDL 10.0 (H) 04/08/2023 1515   LDLCALC 297 (H) 04/08/2023 1515    CBC    Component Value Date/Time   WBC 12.4 (H) 04/08/2023 1515   RBC 4.90 04/08/2023 1515   HGB 14.3 04/08/2023 1515   HGB 14.9 03/13/2017 0843   HCT 43.4 04/08/2023 1515   HCT 44.7 03/13/2017 0843   PLT 311 04/08/2023 1515   PLT 291 03/13/2017 0843   MCV 88.6 04/08/2023 1515   MCV 91 03/13/2017 0843   MCV 88 06/16/2013 1919   MCH 29.2 04/08/2023 1515   MCHC 32.9 04/08/2023 1515   RDW 12.7 04/08/2023 1515   RDW 13.3 03/13/2017 0843   RDW 12.9 06/16/2013 1919   LYMPHSABS 2,594 05/26/2019 0908   LYMPHSABS 2.9 03/13/2017 0843   EOSABS 254 05/26/2019 0908   EOSABS 0.2 03/13/2017 0843   BASOSABS 85 05/26/2019 0908   BASOSABS 0.1 03/13/2017 0843    Hgb A1C Lab Results  Component Value Date   HGBA1C 5.1 04/08/2023            Assessment & Plan:     RTC in 6 months, follow-up chronic conditions Nicki Reaper, NP

## 2023-05-15 ENCOUNTER — Ambulatory Visit
Admission: RE | Admit: 2023-05-15 | Discharge: 2023-05-15 | Disposition: A | Discharge status: Home / Self Care | Payer: Self-pay | Source: Ambulatory Visit | Attending: Internal Medicine | Admitting: Internal Medicine

## 2023-05-15 DIAGNOSIS — N6489 Other specified disorders of breast: Secondary | ICD-10-CM | POA: Insufficient documentation

## 2023-05-20 ENCOUNTER — Encounter: Payer: Self-pay | Admitting: Student

## 2023-05-20 ENCOUNTER — Ambulatory Visit: Admitting: Student

## 2023-05-20 ENCOUNTER — Ambulatory Visit: Payer: Self-pay

## 2023-05-20 VITALS — BP 150/72 | HR 80 | Ht 61.0 in | Wt 183.8 lb

## 2023-05-20 DIAGNOSIS — H66002 Acute suppurative otitis media without spontaneous rupture of ear drum, left ear: Secondary | ICD-10-CM

## 2023-05-20 MED ORDER — AMOXICILLIN-POT CLAVULANATE 875-125 MG PO TABS: 1.0000 | ORAL_TABLET | Freq: Two times a day (BID) | ORAL | 0 refills | Status: AC

## 2023-05-20 NOTE — Telephone Encounter (Signed)
 Chief Complaint: Left ear pain behind left ear Symptoms: Ear pain right behind left ear Frequency: since this morning Pertinent Negatives: Patient denies fever, neck stiffness Disposition: [] ED /[] Urgent Care (no appt availability in office) / [x] Appointment(In office/virtual)/ []  [facility]/ [] Home Care/ [] Refused Recommended Disposition /[] [facility]/ []  Follow-up with PCP Additional Notes: Patient called in stating she has acute ear pain right behind left ear. She had this back in February and was seen in the office for this, given a shot and it resolved. Patient states it returned this morning and is very painful and is requesting appt for soonest available HCP for today. Patient denies fever, URI symptoms, and neck stiffness.    Copied from CRM [id number]. Topic: Appointments - Appointment Scheduling >> May 20, 2023  2:32 PM Emylou G wrote: Hurting behind ear. Getting worse. [phone] her number Reason for Disposition  Earache  (Exceptions: brief ear pain of < 60 minutes duration, earache occurring during air travel  Answer Assessment - Initial Assessment Questions 1. LOCATION: "Which ear is involved?"     Left ear 2. ONSET: "When did the ear start hurting"       Went away for some time after recent visit in February, and restarted this morning 3. SEVERITY: "How bad is the pain?"  (Scale 1-10; mild, moderate or severe)   - MILD (1-3): doesn't interfere with normal activities    - MODERATE (4-7): interferes with normal activities or awakens from sleep    - SEVERE (8-10): excruciating pain, unable to do any normal activities      8 4. URI SYMPTOMS: "Do you have a runny nose or cough?"     No 5. FEVER: "Do you have a fever?" If Yes, ask: "What is your temperature, how was it measured, and when did it start?"     No 6. CAUSE: "Have you been swimming recently?", "How often do you use Q-TIPS?", "Have you had any recent air travel or scuba diving?"     No 7. OTHER  SYMPTOMS: "Do you have any other symptoms?" (e.g., headache, stiff neck, dizziness, vomiting, runny nose, decreased hearing)     Mild dizziness  Protocols used: Earache-A-AH

## 2023-05-20 NOTE — Telephone Encounter (Signed)
 Spoke with patient she is being seen today in [place].

## 2023-05-20 NOTE — Progress Notes (Signed)
 Established Patient Office Visit  Subjective   Patient ID: [name], female    DOB: Mar 10, 1965  Age: 58 y.o. MRN: [id number]  Chief Complaint  Patient presents with   Ear Pain    Ear pain in both ears began this morning. Patient having difficulty with ROM of her neck. Pain is behind the ears. She states she got a shot from her PCP last month for the same thing and it helped. She can't recall injection name.     Otalgia  There is pain in the left ear. This is a recurrent problem. The current episode started today. The problem has been gradually worsening. There has been no fever. Associated symptoms include headaches and neck pain. Pertinent negatives include no coughing, ear discharge, rash, rhinorrhea, sore throat or vomiting. She has tried acetaminophen and NSAIDs (aleve and advil) for the symptoms. There is no history of a chronic ear infection or hearing loss.    Patient Active Problem List   Diagnosis Date Noted   Gastroesophageal reflux disease without esophagitis 08/17/2020   Class 2 severe obesity due to excess calories with serious comorbidity and body mass index (BMI) of 35.0 to 35.9 in adult [facility]) 08/17/2020   Psychophysiological insomnia 11/08/2019   OSA on CPAP 11/08/2019   Essential hypertension 02/11/2018   Anxiety 02/08/2016   Mixed hyperlipidemia 02/08/2016      Review of Systems  HENT:  Positive for ear pain. Negative for ear discharge, rhinorrhea and sore throat.   Respiratory:  Negative for cough.   Gastrointestinal:  Negative for vomiting.  Musculoskeletal:  Positive for neck pain.  Skin:  Negative for rash.  Neurological:  Positive for headaches.   Refer to HPI    Objective:     BP (!) 150/78   Pulse 80   Ht 5\' 1"  (1.549 m)   Wt 183 lb 12.8 oz (83.4 kg)   SpO2 96%   BMI 34.73 kg/m  BP Readings from Last 3 Encounters:  05/20/23 (!) 150/78  04/24/23 138/74  04/15/23 (!) 148/76    Physical Exam Constitutional:      Appearance: Normal  appearance.  HENT:     Right Ear: Tympanic membrane, ear canal and external ear normal.     Left Ear: Ear canal and external ear normal. Swelling present. Tympanic membrane is bulging.     Ears:     Comments: Air fluid levels of the left ear, no perforation    Mouth/Throat:     Mouth: Mucous membranes are moist.     Pharynx: Oropharynx is clear.  Cardiovascular:     Rate and Rhythm: Normal rate and regular rhythm.  Pulmonary:     Effort: Pulmonary effort is normal.     Breath sounds: No rhonchi or rales.  Abdominal:     General: Abdomen is flat. Bowel sounds are normal. There is no distension.     Palpations: Abdomen is soft.     Tenderness: There is no abdominal tenderness.  Musculoskeletal:        General: Normal range of motion.     Right lower leg: No edema.     Left lower leg: No edema.  Skin:    General: Skin is warm and dry.     Capillary Refill: Capillary refill takes less than 2 seconds.  Neurological:     General: No focal deficit present.     Mental Status: She is alert and oriented to person, place, and time.  Psychiatric:  Mood and Affect: Mood normal.        Behavior: Behavior normal.        05/20/2023    3:43 PM 04/24/2023    9:28 AM 07/29/2022    3:03 PM  Depression screen PHQ 2/9  Decreased Interest 0 0 0  Down, Depressed, Hopeless 0 0 0  PHQ - 2 Score 0 0 0  Altered sleeping 0    Tired, decreased energy 0    Change in appetite 0    Feeling bad or failure about yourself  0    Trouble concentrating 0    Moving slowly or fidgety/restless 0    Suicidal thoughts 0    PHQ-9 Score 0    Difficult doing work/chores Not difficult at all         05/20/2023    3:43 PM 04/24/2023    9:28 AM 07/29/2022    3:03 PM 08/22/2021    9:23 AM  GAD 7 : Generalized Anxiety Score  Nervous, Anxious, on Edge 0 0 0 0  Control/stop worrying 1 0 0 0  Worry too much - different things 1 0 1 0  Trouble relaxing 0 0 1 2  Restless 0 0 0 0  Easily annoyed or irritable 0 0  0 0  Afraid - awful might happen 0 0 0 0  Total GAD 7 Score 2 0 2 2  Anxiety Difficulty Not difficult at all Not difficult at all Not difficult at all       Assessment & Plan:  Non-recurrent acute suppurative otitis media of left ear without spontaneous rupture of tympanic membrane Pain radiates patient presenting with 1 day of ear patient presents with 1 day of ear pain similar to episode from 04/15/2023.  Was treated with Depo-Provera injection for possible shingles.  No rash behind ears today.  Left tympanic membrane with fluid air-fluid levels and mild bulging.  Exam consistent with otitis media.  Will treat with Augmentin for 5 days and follow-up with PCP if not improving. Other orders -     Amoxicillin-Pot Clavulanate; Take 1 tablet by mouth 2 (two) times daily for 5 days.  Dispense: 10 tablet; Refill: 0    Return if symptoms worsen or fail to improve.    Quincy Simmonds, MD

## 2023-05-22 ENCOUNTER — Encounter: Payer: Self-pay | Admitting: Internal Medicine

## 2023-05-22 ENCOUNTER — Ambulatory Visit: Admitting: Internal Medicine

## 2023-05-22 VITALS — BP 148/76 | Ht 61.0 in | Wt 184.2 lb

## 2023-05-22 DIAGNOSIS — H6993 Unspecified Eustachian tube disorder, bilateral: Secondary | ICD-10-CM

## 2023-05-22 DIAGNOSIS — I10 Essential (primary) hypertension: Secondary | ICD-10-CM

## 2023-05-22 MED ORDER — METHYLPREDNISOLONE ACETATE 80 MG/ML IJ SUSP
80.0000 mg | Freq: Once | INTRAMUSCULAR | Status: AC
  Administered 2023-05-22: 80 mg via INTRAMUSCULAR

## 2023-05-22 MED ORDER — AMLODIPINE-OLMESARTAN 10-40 MG PO TABS
1.0000 | ORAL_TABLET | Freq: Every day | ORAL | 1 refills | Status: DC
Start: 2023-05-22 — End: 2023-10-28

## 2023-05-22 NOTE — Progress Notes (Signed)
 Subjective:    Patient ID: [name], female    DOB: 1966-02-06, 58 y.o.   MRN: [id number]  HPI  Discussed the use of AI scribe software for clinical note transcription with the patient, who gave verbal consent to proceed.   [name] is a 58 year old female with Eustachian tube dysfunction who presents with ear pain.  She has been experiencing left ear pain for the past two to three days, beginning on Tuesday, localized behind the left ear. She recalls receiving a steroid shot on February 18th, which provided relief at that time. She has been using Flonase regularly. Two days ago, she consulted another doctor who diagnosed her with an ear infection and prescribed Augmentin 875 mg/125 mg for five days. However, she has not noticed any improvement in her symptoms since starting the antibiotic. She also reports a mild headache. No runny nose, nasal congestion, or sore throat. She has tried over-the-counter medications such as Tylenol but has not used Sudafed. She mentions difficulty sleeping, which she attributes to the pain.  She is currently taking amlodipine-olmesartan 10-20 mg for hypertension. She is concerned about her consistently high blood pressure readings, including today's measurement of 154/78, despite adherence to her medication regimen.       Review of Systems   Past Medical History:  Diagnosis Date   Anxiety Last year   Fatigue    Heart murmur N/A   Hyperlipemia    Hypertension Last year   Irregular menstrual cycle    Obesity     Current Outpatient Medications  Medication Sig Dispense Refill   amlodipine-olmesartan (AZOR) 10-20 MG tablet Take 1 tablet by mouth daily. 90 tablet 1   amoxicillin-clavulanate (AUGMENTIN) 875-125 MG tablet Take 1 tablet by mouth 2 (two) times daily for 5 days. 10 tablet 0   FLUoxetine (PROZAC) 10 MG capsule Take 1 capsule (10 mg total) by mouth daily.     hydrALAZINE (APRESOLINE) 25 MG tablet Take 1 tablet (25 mg total) by mouth  2 (two) times daily. 180 tablet 1   rosuvastatin (CRESTOR) 20 MG tablet Take 1 tablet (20 mg total) by mouth daily. 90 tablet 1   No current facility-administered medications for this visit.    No Known Allergies  Family History  Problem Relation Age of Onset   Hypertension Mother    Heart disease Mother    Kidney disease Mother    Heart disease Father    Hypertension Father    Thyroid disease Father    Heart attack Father 53   CAD Father    Kidney failure Father    Heart failure Maternal Grandmother    Lung cancer Maternal Grandfather    Cancer Paternal Grandmother    Coronary artery disease Paternal Grandmother    Cancer Paternal Grandfather    Diabetes Neg Hx    Breast cancer Neg Hx    Colon cancer Neg Hx     Social History   Socioeconomic History   Marital status: Married    Spouse name: Not on file   Number of children: Not on file   Years of education: Not on file   Highest education level: High school graduate  Occupational History   Not on file  Tobacco Use   Smoking status: Every Day    Current packs/day: 1.50    Average packs/day: 1.5 packs/day for 30.2 years (45.2 ttl pk-yrs)    Types: Cigarettes    Start date: 03/23/1993   Smokeless tobacco: Never  Vaping Use   Vaping status: Never Used  Substance and Sexual Activity   Alcohol use: No    Alcohol/week: 0.0 standard drinks of alcohol   Drug use: No   Sexual activity: Yes    Birth control/protection: Post-menopausal  Other Topics Concern   Not on file  Social History Narrative   Not on file   Social Drivers of Health   Financial Resource Strain: Low Risk  (02/11/2018)   Overall Financial Resource Strain (CARDIA)    Difficulty of Paying Living Expenses: Not very hard  Food Insecurity: No Food Insecurity (02/11/2018)   Hunger Vital Sign    Worried About Running Out of Food in the Last Year: Never true    Ran Out of Food in the Last Year: Never true  Transportation Needs: No Transportation  Needs (02/11/2018)   PRAPARE - Administrator, Civil Service (Medical): No    Lack of Transportation (Non-Medical): No  Physical Activity: Inactive (02/11/2018)   Exercise Vital Sign    Days of Exercise per Week: 0 days    Minutes of Exercise per Session: 0 min  Stress: No Stress Concern Present (02/11/2018)   [employer] of Occupational Health - Occupational Stress Questionnaire    Feeling of Stress : Only a little  Social Connections: Not on file  Intimate Partner Violence: Not At Risk (02/11/2018)   Humiliation, Afraid, Rape, and Kick questionnaire    Fear of Current or Ex-Partner: No    Emotionally Abused: No    Physically Abused: No    Sexually Abused: No     Constitutional: Pt reports headache. Denies fever, malaise, fatigue, or abrupt weight changes.  HEENT: Patient reports left ear pain.  Denies eye pain, eye redness, ringing in the ears, wax buildup, runny nose, nasal congestion, bloody nose, or sore throat. Respiratory: Denies difficulty breathing, shortness of breath, cough or sputum production.   Cardiovascular: Denies chest pain, chest tightness, palpitations or swelling in the hands or feet.  Gastrointestinal: Denies abdominal pain, bloating, constipation, diarrhea or blood in the stool.  Neurological: Denies dizziness, difficulty with memory, difficulty with speech or problems with balance and coordination.    No other specific complaints in a complete review of systems (except as listed in HPI above).      Objective:   Physical Exam  BP (!) 154/78 (BP Location: Right Arm, Patient Position: Sitting, Cuff Size: Normal)   Ht 5\' 1"  (1.549 m)   Wt 184 lb 3.2 oz (83.6 kg)   BMI 34.80 kg/m     Wt Readings from Last 3 Encounters:  05/20/23 183 lb 12.8 oz (83.4 kg)  04/24/23 185 lb 12.8 oz (84.3 kg)  04/15/23 188 lb (85.3 kg)    General: Appears her stated age, obese, in NAD. Skin: Warm, dry and intact.  HEENT: Head: normal shape and size, no  sinus tenderness noted; Eyes: sclera white, no icterus, conjunctiva pink, PERRLA and EOMs intact; Ears: Tm's gray and intact, normal light reflex, + serous effusion bilaterally; Nose: mucosa pink and moist, septum midline; Throat/Mouth: Teeth present, mucosa pink and moist, no exudate, lesions or ulcerations noted.  Neck: No adenopathy noted. Cardiovascular: Normal rate and rhythm.  Pulmonary/Chest: Normal effort and positive vesicular breath sounds. No respiratory distress. No wheezes, rales or ronchi noted.  Musculoskeletal:  No difficulty with gait.  Neurological: Alert and oriented.   BMET    Component Value Date/Time   NA 138 04/08/2023 1515   NA 144 03/18/2018 9147  NA 140 06/16/2013 1919   K 3.9 04/08/2023 1515   K 3.5 06/16/2013 1919   CL 104 04/08/2023 1515   CL 107 06/16/2013 1919   CO2 26 04/08/2023 1515   CO2 27 06/16/2013 1919   GLUCOSE 84 04/08/2023 1515   GLUCOSE 127 (H) 06/16/2013 1919   BUN 13 04/08/2023 1515   BUN 11 03/18/2018 0922   BUN 13 06/16/2013 1919   CREATININE 0.95 04/08/2023 1515   CALCIUM 9.4 04/08/2023 1515   CALCIUM 8.7 06/16/2013 1919   GFRNONAA 88 09/01/2020 0753   GFRAA 102 09/01/2020 0753    Lipid Panel     Component Value Date/Time   CHOL 401 (H) 04/08/2023 1515   CHOL 300 (H) 01/02/2018 1030   TRIG 376 (H) 04/08/2023 1515   HDL 40 (L) 04/08/2023 1515   HDL 38 (L) 01/02/2018 1030   CHOLHDL 10.0 (H) 04/08/2023 1515   LDLCALC 297 (H) 04/08/2023 1515    CBC    Component Value Date/Time   WBC 12.4 (H) 04/08/2023 1515   RBC 4.90 04/08/2023 1515   HGB 14.3 04/08/2023 1515   HGB 14.9 03/13/2017 0843   HCT 43.4 04/08/2023 1515   HCT 44.7 03/13/2017 0843   PLT 311 04/08/2023 1515   PLT 291 03/13/2017 0843   MCV 88.6 04/08/2023 1515   MCV 91 03/13/2017 0843   MCV 88 06/16/2013 1919   MCH 29.2 04/08/2023 1515   MCHC 32.9 04/08/2023 1515   RDW 12.7 04/08/2023 1515   RDW 13.3 03/13/2017 0843   RDW 12.9 06/16/2013 1919   LYMPHSABS  2,594 05/26/2019 0908   LYMPHSABS 2.9 03/13/2017 0843   EOSABS 254 05/26/2019 0908   EOSABS 0.2 03/13/2017 0843   BASOSABS 85 05/26/2019 0908   BASOSABS 0.1 03/13/2017 0843    Hgb A1C Lab Results  Component Value Date   HGBA1C 5.1 04/08/2023            Assessment & Plan:  Assessment and Plan    Eustachian tube dysfunction Fluid in both ears, no infection. Augmentin ineffective, stopped. Advised against frequent steroid use. Suggested Sudafed with caution due to hypertension. - Administer 80 mg Depo-Medrol injection. - Stop Augmentin. - Recommend Sudafed for three days if symptoms persist, caution due to elevated blood pressure. - Follow up with ENT specialist next Thursday.  Hypertension Blood pressure 154/78, consistent with previous readings. On amlodipine-olmesartan 10-20 mg. Increased dosage to improve control. Advised close monitoring. - Increase amlodipine-olmesartan to 10-40 mg. - Schedule follow-up appointment in two weeks to recheck blood pressure. - Instruct to take medication two hours before the follow-up appointment.        RTC in 2 weeks follow-up HTN, 4 months for follow-up of chronic conditions Nicki Reaper, NP

## 2023-05-22 NOTE — Patient Instructions (Signed)
 Eustachian Tube Dysfunction  Eustachian tube dysfunction refers to a condition in which a blockage develops in the narrow passage that connects the middle ear to the back of the nose (eustachian tube). The eustachian tube regulates air pressure in the middle ear by letting air move between the ear and nose. It also helps to drain fluid from the middle ear space. Eustachian tube dysfunction can affect one or both ears. When the eustachian tube does not function properly, air pressure, fluid, or both can build up in the middle ear. What are the causes? This condition occurs when the eustachian tube becomes blocked or cannot open normally. Common causes of this condition include: Ear infections. Colds and other infections that affect the nose, mouth, and throat (upper respiratory tract). Allergies. Irritation from cigarette smoke. Irritation from stomach acid coming up into the esophagus (gastroesophageal reflux). The esophagus is the part of the body that moves food from the mouth to the stomach. Sudden changes in air pressure, such as from descending in an airplane or scuba diving. Abnormal growths in the nose or throat, such as: Growths that line the nose (nasal polyps). Abnormal growth of cells (tumors). Enlarged tissue at the back of the throat (adenoids). What increases the risk? You are more likely to develop this condition if: You smoke. You are overweight. You are a child who has: Certain birth defects of the mouth, such as cleft palate. Large tonsils or adenoids. What are the signs or symptoms? Common symptoms of this condition include: A feeling of fullness in the ear. Ear pain. Clicking or popping noises in the ear. Ringing in the ear (tinnitus). Hearing loss. Loss of balance. Dizziness. Symptoms may get worse when the air pressure around you changes, such as when you travel to an area of high elevation, fly on an airplane, or go scuba diving. How is this diagnosed? This  condition may be diagnosed based on: Your symptoms. A physical exam of your ears, nose, and throat. Tests, such as those that measure: The movement of your eardrum. Your hearing (audiometry). How is this treated? Treatment depends on the cause and severity of your condition. In mild cases, you may relieve your symptoms by moving air into your ears. This is called "popping the ears." In more severe cases, or if you have symptoms of fluid in your ears, treatment may include: Medicines to relieve congestion (decongestants). Medicines that treat allergies (antihistamines). Nasal sprays or ear drops that contain medicines that reduce swelling (steroids). A procedure to drain the fluid in your eardrum. In this procedure, a small tube may be placed in the eardrum to: Drain the fluid. Restore the air in the middle ear space. A procedure to insert a balloon device through the nose to inflate the opening of the eustachian tube (balloon dilation). Follow these instructions at home: Lifestyle Do not do any of the following until your health care provider approves: Travel to high altitudes. Fly in airplanes. Work in a Estate agent or room. Scuba dive. Do not use any products that contain nicotine or tobacco. These products include cigarettes, chewing tobacco, and vaping devices, such as e-cigarettes. If you need help quitting, ask your health care provider. Keep your ears dry. Wear fitted earplugs during showering and bathing. Dry your ears completely after. General instructions Take over-the-counter and prescription medicines only as told by your health care provider. Use techniques to help pop your ears as recommended by your health care provider. These may include: Chewing gum. Yawning. Frequent, forceful swallowing.  Closing your mouth, holding your nose closed, and gently blowing as if you are trying to blow air out of your nose. Keep all follow-up visits. This is important. Contact a  health care provider if: Your symptoms do not go away after treatment. Your symptoms come back after treatment. You are unable to pop your ears. You have: A fever. Pain in your ear. Pain in your head or neck. Fluid draining from your ear. Your hearing suddenly changes. You become very dizzy. You lose your balance. Get help right away if: You have a sudden, severe increase in any of your symptoms. Summary Eustachian tube dysfunction refers to a condition in which a blockage develops in the eustachian tube. It can be caused by ear infections, allergies, inhaled irritants, or abnormal growths in the nose or throat. Symptoms may include ear pain or fullness, hearing loss, or ringing in the ears. Mild cases are treated with techniques to unblock the ears, such as yawning or chewing gum. More severe cases are treated with medicines or procedures. This information is not intended to replace advice given to you by your health care provider. Make sure you discuss any questions you have with your health care provider. Document Revised: 04/24/2020 Document Reviewed: 04/24/2020 Elsevier Patient Education  2024 [employer].

## 2023-06-05 ENCOUNTER — Ambulatory Visit: Admitting: Internal Medicine

## 2023-06-05 ENCOUNTER — Encounter: Payer: Self-pay | Admitting: Internal Medicine

## 2023-06-05 VITALS — BP 138/80 | Ht 61.0 in | Wt 184.0 lb

## 2023-06-05 DIAGNOSIS — E66811 Obesity, class 1: Secondary | ICD-10-CM

## 2023-06-05 DIAGNOSIS — I10 Essential (primary) hypertension: Secondary | ICD-10-CM

## 2023-06-05 DIAGNOSIS — E6609 Other obesity due to excess calories: Secondary | ICD-10-CM | POA: Diagnosis not present

## 2023-06-05 DIAGNOSIS — Z6834 Body mass index (BMI) 34.0-34.9, adult: Secondary | ICD-10-CM

## 2023-06-05 NOTE — Assessment & Plan Note (Signed)
 Encouraged diet and exercise for weight loss ?

## 2023-06-05 NOTE — Assessment & Plan Note (Signed)
 Remains elevated but manual repeat improved Encouraged her to take the hydralazine 2 times a day as prescribed, may need to increase this to 3 times a day Continue amlodipine-olmesartan at current dose Reinforced DASH diet and exercise for weight loss We will obtain renal artery ultrasound for further evaluation given resistant hypertension and family history of kidney problems on dialysis

## 2023-06-05 NOTE — Patient Instructions (Signed)
 Managing Your Hypertension Hypertension, also called high blood pressure, is when the force of the blood pressing against the walls of the arteries is too strong. Arteries are blood vessels that carry blood from your heart throughout your body. Hypertension forces the heart to work harder to pump blood and may cause the arteries to become narrow or stiff. Understanding blood pressure readings A blood pressure reading includes a higher number over a lower number: The first, or top, number is called the systolic pressure. It is a measure of the pressure in your arteries as your heart beats. The second, or bottom number, is called the diastolic pressure. It is a measure of the pressure in your arteries as the heart relaxes. For most people, a normal blood pressure is below 120/80. Your personal target blood pressure may vary depending on your medical conditions, your age, and other factors. Blood pressure is classified into four stages. Based on your blood pressure reading, your health care provider may use the following stages to determine what type of treatment you need, if any. Systolic pressure and diastolic pressure are measured in a unit called millimeters of mercury (mmHg). Normal Systolic pressure: below 120. Diastolic pressure: below 80. Elevated Systolic pressure: 120-129. Diastolic pressure: below 80. Hypertension stage 1 Systolic pressure: 130-139. Diastolic pressure: 80-89. Hypertension stage 2 Systolic pressure: 140 or above. Diastolic pressure: 90 or above. How can this condition affect me? Managing your hypertension is very important. Over time, hypertension can damage the arteries and decrease blood flow to parts of the body, including the brain, heart, and kidneys. Having untreated or uncontrolled hypertension can lead to: A heart attack. A stroke. A weakened blood vessel (aneurysm). Heart failure. Kidney damage. Eye damage. Memory and concentration problems. Vascular  dementia. What actions can I take to manage this condition? Hypertension can be managed by making lifestyle changes and possibly by taking medicines. Your health care provider will help you make a plan to bring your blood pressure within a normal range. You may be referred for counseling on a healthy diet and physical activity. Nutrition  Eat a diet that is high in fiber and potassium, and low in salt (sodium), added sugar, and fat. An example eating plan is called the DASH diet. DASH stands for Dietary Approaches to Stop Hypertension. To eat this way: Eat plenty of fresh fruits and vegetables. Try to fill one-half of your plate at each meal with fruits and vegetables. Eat whole grains, such as whole-wheat pasta, brown rice, or whole-grain bread. Fill about one-fourth of your plate with whole grains. Eat low-fat dairy products. Avoid fatty cuts of meat, processed or cured meats, and poultry with skin. Fill about one-fourth of your plate with lean proteins such as fish, chicken without skin, beans, eggs, and tofu. Avoid pre-made and processed foods. These tend to be higher in sodium, added sugar, and fat. Reduce your daily sodium intake. Many people with hypertension should eat less than 1,500 mg of sodium a day. Lifestyle  Work with your health care provider to maintain a healthy body weight or to lose weight. Ask what an ideal weight is for you. Get at least 30 minutes of exercise that causes your heart to beat faster (aerobic exercise) most days of the week. Activities may include walking, swimming, or biking. Include exercise to strengthen your muscles (resistance exercise), such as weight lifting, as part of your weekly exercise routine. Try to do these types of exercises for 30 minutes at least 3 days a week. Do  not use any products that contain nicotine or tobacco. These products include cigarettes, chewing tobacco, and vaping devices, such as e-cigarettes. If you need help quitting, ask your  health care provider. Control any long-term (chronic) conditions you have, such as high cholesterol or diabetes. Identify your sources of stress and find ways to manage stress. This may include meditation, deep breathing, or making time for fun activities. Alcohol use Do not drink alcohol if: Your health care provider tells you not to drink. You are pregnant, may be pregnant, or are planning to become pregnant. If you drink alcohol: Limit how much you have to: 0-1 drink a day for women. 0-2 drinks a day for men. Know how much alcohol is in your drink. In the [place] one drink equals one 12 oz bottle of beer (355 mL), one 5 oz glass of wine (148 mL), or one 1 oz glass of hard liquor (44 mL). Medicines Your health care provider may prescribe medicine if lifestyle changes are not enough to get your blood pressure under control and if: Your systolic blood pressure is 130 or higher. Your diastolic blood pressure is 80 or higher. Take medicines only as told by your health care provider. Follow the directions carefully. Blood pressure medicines must be taken as told by your health care provider. The medicine does not work as well when you skip doses. Skipping doses also puts you at risk for problems. Monitoring Before you monitor your blood pressure: Do not smoke, drink caffeinated beverages, or exercise within 30 minutes before taking a measurement. Use the bathroom and empty your bladder (urinate). Sit quietly for at least 5 minutes before taking measurements. Monitor your blood pressure at home as told by your health care provider. To do this: Sit with your back straight and supported. Place your feet flat on the floor. Do not cross your legs. Support your arm on a flat surface, such as a table. Make sure your upper arm is at heart level. Each time you measure, take two or three readings one minute apart and record the results. You may also need to have your blood pressure checked regularly by  your health care provider. General information Talk with your health care provider about your diet, exercise habits, and other lifestyle factors that may be contributing to hypertension. Review all the medicines you take with your health care provider because there may be side effects or interactions. Keep all follow-up visits. Your health care provider can help you create and adjust your plan for managing your high blood pressure. Where to find more information National Heart, Lung, and Blood Institute: [employer]is American Heart Association: www.heart.org Contact a health care provider if: You think you are having a reaction to medicines you have taken. You have repeated (recurrent) headaches. You feel dizzy. You have swelling in your ankles. You have trouble with your vision. Get help right away if: You develop a severe headache or confusion. You have unusual weakness or numbness, or you feel faint. You have severe pain in your chest or abdomen. You vomit repeatedly. You have trouble breathing. These symptoms may be an emergency. Get help right away. Call 911. Do not wait to see if the symptoms will go away. Do not drive yourself to the hospital. Summary Hypertension is when the force of blood pumping through your arteries is too strong. If this condition is not controlled, it may put you at risk for serious complications. Your personal target blood pressure may vary depending on your medical conditions,  your age, and other factors. For most people, a normal blood pressure is less than 120/80. Hypertension is managed by lifestyle changes, medicines, or both. Lifestyle changes to help manage hypertension include losing weight, eating a healthy, low-sodium diet, exercising more, stopping smoking, and limiting alcohol. This information is not intended to replace advice given to you by your health care provider. Make sure you discuss any questions you have with your health care  provider. Document Revised: 10/26/2020 Document Reviewed: 10/26/2020 Elsevier Patient Education  2024 [employer].

## 2023-06-05 NOTE — Progress Notes (Signed)
 Subjective:    Patient ID: [name], female    DOB: 1965-09-27, 58 y.o.   MRN: [id number]  HPI  Patient presents to clinic today for 2-week follow-up of HTN.  At her last visit, her amlodipine-olmesartan was increased to 10-40 mg daily in addition to hydralazine.  She admits that she has only been taking her hydralazine 1 time a day instead of 2 times a day.  Her BP today is 152/74.  ECG from 05/2016 reviewed.  She is not taking NSAIDs OTC.  She does smoke.  She reports she has a family history of kidney disease on dialysis in her father and her mother has chronic kidney disease as well.   Review of Systems   Past Medical History:  Diagnosis Date   Anxiety Last year   Fatigue    Heart murmur N/A   Hyperlipemia    Hypertension Last year   Irregular menstrual cycle    Obesity     Current Outpatient Medications  Medication Sig Dispense Refill   amLODipine-olmesartan (AZOR) 10-40 MG tablet Take 1 tablet by mouth daily. 90 tablet 1   FLUoxetine (PROZAC) 10 MG capsule Take 1 capsule (10 mg total) by mouth daily.     hydrALAZINE (APRESOLINE) 25 MG tablet Take 1 tablet (25 mg total) by mouth 2 (two) times daily. 180 tablet 1   rosuvastatin (CRESTOR) 20 MG tablet Take 1 tablet (20 mg total) by mouth daily. 90 tablet 1   No current facility-administered medications for this visit.    No Known Allergies  Family History  Problem Relation Age of Onset   Hypertension Mother    Heart disease Mother    Kidney disease Mother    Heart disease Father    Hypertension Father    Thyroid disease Father    Heart attack Father 29   CAD Father    Kidney failure Father    Heart failure Maternal Grandmother    Lung cancer Maternal Grandfather    Cancer Paternal Grandmother    Coronary artery disease Paternal Grandmother    Cancer Paternal Grandfather    Diabetes Neg Hx    Breast cancer Neg Hx    Colon cancer Neg Hx     Social History   Socioeconomic History   Marital status:  Married    Spouse name: Not on file   Number of children: Not on file   Years of education: Not on file   Highest education level: High school graduate  Occupational History   Not on file  Tobacco Use   Smoking status: Every Day    Current packs/day: 1.50    Average packs/day: 1.5 packs/day for 30.2 years (45.3 ttl pk-yrs)    Types: Cigarettes    Start date: 03/23/1993   Smokeless tobacco: Never  Vaping Use   Vaping status: Never Used  Substance and Sexual Activity   Alcohol use: No    Alcohol/week: 0.0 standard drinks of alcohol   Drug use: No   Sexual activity: Yes    Birth control/protection: Post-menopausal  Other Topics Concern   Not on file  Social History Narrative   Not on file   Social Drivers of Health   Financial Resource Strain: Low Risk  (02/11/2018)   Overall Financial Resource Strain (CARDIA)    Difficulty of Paying Living Expenses: Not very hard  Food Insecurity: No Food Insecurity (02/11/2018)   Hunger Vital Sign    Worried About Running Out of Food in the Last Year:  Never true    Ran Out of Food in the Last Year: Never true  Transportation Needs: No Transportation Needs (02/11/2018)   PRAPARE - Administrator, Civil Service (Medical): No    Lack of Transportation (Non-Medical): No  Physical Activity: Inactive (02/11/2018)   Exercise Vital Sign    Days of Exercise per Week: 0 days    Minutes of Exercise per Session: 0 min  Stress: No Stress Concern Present (02/11/2018)   [employer] of Occupational Health - Occupational Stress Questionnaire    Feeling of Stress : Only a little  Social Connections: Not on file  Intimate Partner Violence: Not At Risk (02/11/2018)   Humiliation, Afraid, Rape, and Kick questionnaire    Fear of Current or Ex-Partner: No    Emotionally Abused: No    Physically Abused: No    Sexually Abused: No     Constitutional: Pt reports headaches. Denies fever, malaise, fatigue, or abrupt weight changes.  HEENT:  Patient reports left ear pain.  Denies eye pain, eye redness, ringing in the ears, wax buildup, runny nose, nasal congestion, bloody nose, or sore throat. Respiratory: Denies difficulty breathing, shortness of breath, cough or sputum production.   Cardiovascular: Denies chest pain, chest tightness, palpitations or swelling in the hands or feet.  Gastrointestinal: Denies abdominal pain, bloating, constipation, diarrhea or blood in the stool.  Neurological: Denies dizziness, difficulty with memory, difficulty with speech or problems with balance and coordination.    No other specific complaints in a complete review of systems (except as listed in HPI above).      Objective:   Physical Exam  BP 138/80   Ht 5\' 1"  (1.549 m)   Wt 184 lb (83.5 kg)   BMI 34.77 kg/m      Wt Readings from Last 3 Encounters:  05/22/23 184 lb 3.2 oz (83.6 kg)  05/20/23 183 lb 12.8 oz (83.4 kg)  04/24/23 185 lb 12.8 oz (84.3 kg)    General: Appears her stated age, obese, in NAD. Skin: Warm, dry and intact.  Cardiovascular: Normal rate and rhythm.  Pulmonary/Chest: Normal effort and positive vesicular breath sounds. No respiratory distress. No wheezes, rales or ronchi noted.  Musculoskeletal:  No difficulty with gait.  Neurological: Alert and oriented.   BMET    Component Value Date/Time   NA 138 04/08/2023 1515   NA 144 03/18/2018 0922   NA 140 06/16/2013 1919   K 3.9 04/08/2023 1515   K 3.5 06/16/2013 1919   CL 104 04/08/2023 1515   CL 107 06/16/2013 1919   CO2 26 04/08/2023 1515   CO2 27 06/16/2013 1919   GLUCOSE 84 04/08/2023 1515   GLUCOSE 127 (H) 06/16/2013 1919   BUN 13 04/08/2023 1515   BUN 11 03/18/2018 0922   BUN 13 06/16/2013 1919   CREATININE 0.95 04/08/2023 1515   CALCIUM 9.4 04/08/2023 1515   CALCIUM 8.7 06/16/2013 1919   GFRNONAA 88 09/01/2020 0753   GFRAA 102 09/01/2020 0753    Lipid Panel     Component Value Date/Time   CHOL 401 (H) 04/08/2023 1515   CHOL 300 (H)  01/02/2018 1030   TRIG 376 (H) 04/08/2023 1515   HDL 40 (L) 04/08/2023 1515   HDL 38 (L) 01/02/2018 1030   CHOLHDL 10.0 (H) 04/08/2023 1515   LDLCALC 297 (H) 04/08/2023 1515    CBC    Component Value Date/Time   WBC 12.4 (H) 04/08/2023 1515   RBC 4.90 04/08/2023 1515  HGB 14.3 04/08/2023 1515   HGB 14.9 03/13/2017 0843   HCT 43.4 04/08/2023 1515   HCT 44.7 03/13/2017 0843   PLT 311 04/08/2023 1515   PLT 291 03/13/2017 0843   MCV 88.6 04/08/2023 1515   MCV 91 03/13/2017 0843   MCV 88 06/16/2013 1919   MCH 29.2 04/08/2023 1515   MCHC 32.9 04/08/2023 1515   RDW 12.7 04/08/2023 1515   RDW 13.3 03/13/2017 0843   RDW 12.9 06/16/2013 1919   LYMPHSABS 2,594 05/26/2019 0908   LYMPHSABS 2.9 03/13/2017 0843   EOSABS 254 05/26/2019 0908   EOSABS 0.2 03/13/2017 0843   BASOSABS 85 05/26/2019 0908   BASOSABS 0.1 03/13/2017 0843    Hgb A1C Lab Results  Component Value Date   HGBA1C 5.1 04/08/2023            Assessment & Plan:      RTC in 4 months for follow-up of chronic conditions Nicki Reaper, NP

## 2023-06-10 ENCOUNTER — Ambulatory Visit
Admission: RE | Admit: 2023-06-10 | Discharge: 2023-06-10 | Disposition: A | Discharge status: Home / Self Care | Source: Ambulatory Visit | Attending: Internal Medicine | Admitting: Internal Medicine

## 2023-06-10 DIAGNOSIS — I10 Essential (primary) hypertension: Secondary | ICD-10-CM | POA: Diagnosis present

## 2023-06-30 ENCOUNTER — Telehealth: Payer: Self-pay

## 2023-06-30 DIAGNOSIS — I701 Atherosclerosis of renal artery: Secondary | ICD-10-CM

## 2023-06-30 NOTE — Telephone Encounter (Signed)
 Copied from CRM [id number]. Topic: Clinical - Lab/Test Results >> Jun 30, 2023  1:03 PM Lizabeth Riggs wrote: Reason for CRM:  Gwendy has a question about the note from [facility]. Jakaiya wants to know what a renal artery stenosis is? She is okay with the referral but wanted to know this before she is referred. Please call her at [phone]. Thanks

## 2023-07-01 DIAGNOSIS — I701 Atherosclerosis of renal artery: Secondary | ICD-10-CM | POA: Insufficient documentation

## 2023-07-01 NOTE — Telephone Encounter (Signed)
 Referral has been placed.  Referral attached to a different phone message.

## 2023-07-01 NOTE — Addendum Note (Signed)
 Addended by: Carollynn Cirri on: 07/01/2023 07:44 AM   Modules accepted: Orders

## 2023-07-01 NOTE — Telephone Encounter (Signed)
 Renal artery stenosis is a condition where the renal artery is smaller than it should be.  This results in elevated blood pressure that is not responsive to medications.  The treatment for this is evaluation by vascular to see if it significant enough for them to go in and put a stent in place.  Someone will reach out to her with an appointment with vascular.

## 2023-07-15 ENCOUNTER — Encounter (INDEPENDENT_AMBULATORY_CARE_PROVIDER_SITE_OTHER): Payer: Self-pay

## 2023-08-06 ENCOUNTER — Other Ambulatory Visit (INDEPENDENT_AMBULATORY_CARE_PROVIDER_SITE_OTHER): Payer: Self-pay | Admitting: Nurse Practitioner

## 2023-08-06 DIAGNOSIS — I701 Atherosclerosis of renal artery: Secondary | ICD-10-CM

## 2023-08-07 ENCOUNTER — Encounter (INDEPENDENT_AMBULATORY_CARE_PROVIDER_SITE_OTHER): Payer: Self-pay | Admitting: Vascular Surgery

## 2023-08-07 ENCOUNTER — Ambulatory Visit (INDEPENDENT_AMBULATORY_CARE_PROVIDER_SITE_OTHER): Payer: Self-pay | Admitting: Vascular Surgery

## 2023-08-07 ENCOUNTER — Ambulatory Visit (INDEPENDENT_AMBULATORY_CARE_PROVIDER_SITE_OTHER): Payer: Self-pay

## 2023-08-07 VITALS — BP 189/83 | HR 71 | Resp 18 | Ht 62.0 in | Wt 178.6 lb

## 2023-08-07 DIAGNOSIS — I10 Essential (primary) hypertension: Secondary | ICD-10-CM

## 2023-08-07 DIAGNOSIS — E782 Mixed hyperlipidemia: Secondary | ICD-10-CM

## 2023-08-07 DIAGNOSIS — I701 Atherosclerosis of renal artery: Secondary | ICD-10-CM

## 2023-08-07 NOTE — Progress Notes (Signed)
 Subjective:    Patient ID: [name], female    DOB: [birth year], 58 y.o.   MRN: [id number] Chief Complaint  Patient presents with   Establish Care    New Patient + renal consult, Renal Artery Stenosis Ref. Baity    [name] is a 58 year old female who presents to clinic today with chief complaint of hypertension.  Patient underwent renal ultrasound back on 06/10/2023 which showed high velocities to the proximal renal artery on the left side of 238 centimeters per second suggesting that she had left renal artery stenosis.  Patient does endorse that she has had hypertension for a very long time.  She also endorses that they just changed her medications to a combination blood pressure medication.  Today in clinic her blood pressure is 183/83 which she endorses is a little high for her but she is nervous.  She denies any headaches blurred vision amaurosis fugax.  She denies any nausea vomiting or diarrhea.  She denies headaches or migraines.    Review of Systems  Constitutional: Negative.   Cardiovascular: Negative.   Neurological: Negative.   Psychiatric/Behavioral:  The patient is nervous/anxious.   All other systems reviewed and are negative.      Objective:   Physical Exam Constitutional:      Appearance: Normal appearance. She is obese.  HENT:     Head: Normocephalic.   Eyes:     Pupils: Pupils are equal, round, and reactive to light.    Cardiovascular:     Rate and Rhythm: Normal rate and regular rhythm.     Pulses: Normal pulses.     Heart sounds: Normal heart sounds.  Pulmonary:     Effort: Pulmonary effort is normal.     Breath sounds: Normal breath sounds.  Abdominal:     General: Abdomen is flat.     Palpations: Abdomen is soft.   Musculoskeletal:        General: Normal range of motion.     Cervical back: Normal range of motion.   Skin:    General: Skin is warm and dry.     Capillary Refill: Capillary refill takes 2 to 3 seconds.   Neurological:      General: No focal deficit present.     Mental Status: She is alert and oriented to person, place, and time. Mental status is at baseline.   Psychiatric:        Mood and Affect: Mood normal.        Behavior: Behavior normal.        Thought Content: Thought content normal.        Judgment: Judgment normal.     BP (!) 189/83 (BP Location: Right Arm, Patient Position: Sitting, Cuff Size: Normal)   Pulse 71   Resp 18   Ht 5' 2 (1.575 m)   Wt 178 lb 9.6 oz (81 kg)   BMI 32.67 kg/m   Past Medical History:  Diagnosis Date   Anxiety Last year   Fatigue    Heart murmur N/A   Hyperlipemia    Hypertension Last year   Irregular menstrual cycle    Obesity     Social History   Socioeconomic History   Marital status: Married    Spouse name: Not on file   Number of children: Not on file   Years of education: Not on file   Highest education level: High school graduate  Occupational History   Not on file  Tobacco Use  Smoking status: Every Day    Current packs/day: 1.50    Average packs/day: 1.5 packs/day for 30.4 years (45.6 ttl pk-yrs)    Types: Cigarettes    Start date: 03/23/1993   Smokeless tobacco: Never  Vaping Use   Vaping status: Never Used  Substance and Sexual Activity   Alcohol use: No    Alcohol/week: 0.0 standard drinks of alcohol   Drug use: No   Sexual activity: Yes    Birth control/protection: Post-menopausal  Other Topics Concern   Not on file  Social History Narrative   Not on file   Social Drivers of Health   Financial Resource Strain: Low Risk  (02/11/2018)   Overall Financial Resource Strain (CARDIA)    Difficulty of Paying Living Expenses: Not very hard  Food Insecurity: No Food Insecurity (02/11/2018)   Hunger Vital Sign    Worried About Running Out of Food in the Last Year: Never true    Ran Out of Food in the Last Year: Never true  Transportation Needs: No Transportation Needs (02/11/2018)   PRAPARE - Scientist, research (physical sciences) (Medical): No    Lack of Transportation (Non-Medical): No  Physical Activity: Inactive (02/11/2018)   Exercise Vital Sign    Days of Exercise per Week: 0 days    Minutes of Exercise per Session: 0 min  Stress: No Stress Concern Present (02/11/2018)   [employer] of Occupational Health - Occupational Stress Questionnaire    Feeling of Stress : Only a little  Social Connections: Not on file  Intimate Partner Violence: Not At Risk (02/11/2018)   Humiliation, Afraid, Rape, and Kick questionnaire    Fear of Current or Ex-Partner: No    Emotionally Abused: No    Physically Abused: No    Sexually Abused: No    Past Surgical History:  Procedure Laterality Date   BACK SURGERY     x 4   SPINE SURGERY  10 years ago    Family History  Problem Relation Age of Onset   Hypertension Mother    Heart disease Mother    Kidney disease Mother    Heart disease Father    Hypertension Father    Thyroid  disease Father    Heart attack Father 83   CAD Father    Kidney failure Father    Heart failure Maternal Grandmother    Lung cancer Maternal Grandfather    Cancer Paternal Grandmother    Coronary artery disease Paternal Grandmother    Cancer Paternal Grandfather    Diabetes Neg Hx    Breast cancer Neg Hx    Colon cancer Neg Hx     No Known Allergies     Latest Ref Rng & Units 04/08/2023    3:15 PM 08/22/2021    8:33 AM 09/01/2020    7:53 AM  CBC  WBC 3.8 - 10.8 Thousand/uL 12.4  7.6  12.0   Hemoglobin 11.7 - 15.5 g/dL 63.8  75.6  43.3   Hematocrit 35.0 - 45.0 % 43.4  42.9  42.3   Platelets 140 - 400 Thousand/uL 311  270  259       CMP     Component Value Date/Time   NA 138 04/08/2023 1515   NA 144 03/18/2018 0922   NA 140 06/16/2013 1919   K 3.9 04/08/2023 1515   K 3.5 06/16/2013 1919   CL 104 04/08/2023 1515   CL 107 06/16/2013 1919   CO2 26 04/08/2023 1515  CO2 27 06/16/2013 1919   GLUCOSE 84 04/08/2023 1515   GLUCOSE 127 (H) 06/16/2013 1919   BUN  13 04/08/2023 1515   BUN 11 03/18/2018 0922   BUN 13 06/16/2013 1919   CREATININE 0.95 04/08/2023 1515   CALCIUM  9.4 04/08/2023 1515   CALCIUM  8.7 06/16/2013 1919   PROT 7.0 04/08/2023 1515   PROT 7.0 03/13/2017 0843   ALBUMIN 4.7 03/13/2017 0843   AST 17 04/08/2023 1515   ALT 19 04/08/2023 1515   ALKPHOS 102 03/13/2017 0843   BILITOT 0.3 04/08/2023 1515   BILITOT 0.3 03/13/2017 0843   EGFR 70 04/08/2023 1515   GFRNONAA 88 09/01/2020 0753     No results found.     Assessment & Plan:   1. Renal artery stenosis (HCC) (Primary) Patient presents to clinic today for follow-up from an ultrasound that was done back in 06/10/2023 showing left renal artery stenosis with an origin velocity of 238 cm/s.  We repeated the renal ultrasounds today here in the office.  The left renal artery proximal velocities were 103 cm/s.  Essentially renal ultrasound here today was normal.  She does not show that she has any renal stenosis causing her hypertension.  We did discuss in detail today other causes of possible hypertension.  However she does describe symptoms of possibly having Hashimoto's thyroiditis.  Her last checked thyroid  levels were completed back in 2021.  I recommend thyroid  studies with possible thyroid  ultrasound as she appears to be hypertensive intermittently.  Patient is instructed to follow-up with us  as needed.  I would be more than glad to see her again for any other vascular issues.  2. Essential hypertension Continue antihypertensive medications as already ordered, these medications have been reviewed and there are no changes at this time.  3. Mixed hyperlipidemia Continue statin as ordered and reviewed, no changes at this time   Current Outpatient Medications on File Prior to Visit  Medication Sig Dispense Refill   amLODipine -olmesartan  (AZOR ) 10-40 MG tablet Take 1 tablet by mouth daily. 90 tablet 1   FLUoxetine  (PROZAC ) 10 MG capsule Take 1 capsule (10 mg total) by mouth  daily.     hydrALAZINE  (APRESOLINE ) 25 MG tablet Take 1 tablet (25 mg total) by mouth 2 (two) times daily. 180 tablet 1   rosuvastatin  (CRESTOR ) 20 MG tablet Take 1 tablet (20 mg total) by mouth daily. 90 tablet 1   No current facility-administered medications on file prior to visit.    There are no Patient Instructions on file for this visit. No follow-ups on file.   Annamaria Barrette, NP

## 2023-08-26 ENCOUNTER — Other Ambulatory Visit

## 2023-08-26 DIAGNOSIS — E782 Mixed hyperlipidemia: Secondary | ICD-10-CM

## 2023-08-26 LAB — COMPLETE METABOLIC PANEL WITHOUT GFR
AG Ratio: 2.1 (calc) (ref 1.0–2.5)
ALT: 13 U/L (ref 6–29)
AST: 14 U/L (ref 10–35)
Albumin: 4.4 g/dL (ref 3.6–5.1)
Alkaline phosphatase (APISO): 92 U/L (ref 37–153)
BUN: 11 mg/dL (ref 7–25)
CO2: 27 mmol/L (ref 20–32)
Calcium: 9.6 mg/dL (ref 8.6–10.4)
Chloride: 106 mmol/L (ref 98–110)
Creat: 0.92 mg/dL (ref 0.50–1.03)
Globulin: 2.1 g/dL (ref 1.9–3.7)
Glucose, Bld: 86 mg/dL (ref 65–99)
Potassium: 4.6 mmol/L (ref 3.5–5.3)
Sodium: 140 mmol/L (ref 135–146)
Total Bilirubin: 0.3 mg/dL (ref 0.2–1.2)
Total Protein: 6.5 g/dL (ref 6.1–8.1)

## 2023-08-26 LAB — LIPID PANEL
Cholesterol: 373 mg/dL — ABNORMAL HIGH (ref <200)
HDL: 39 mg/dL — ABNORMAL LOW (ref >=50)
LDL Cholesterol (Calc): 297 mg/dL — ABNORMAL HIGH
Non-HDL Cholesterol (Calc): 334 mg/dL — ABNORMAL HIGH (ref <130)
Total CHOL/HDL Ratio: 9.6 (calc) — ABNORMAL HIGH (ref <5.0)
Triglycerides: 190 mg/dL — ABNORMAL HIGH (ref <150)

## 2023-08-27 ENCOUNTER — Ambulatory Visit: Payer: Self-pay | Admitting: Internal Medicine

## 2023-09-26 ENCOUNTER — Other Ambulatory Visit: Payer: Self-pay | Admitting: Internal Medicine

## 2023-09-26 DIAGNOSIS — F419 Anxiety disorder, unspecified: Secondary | ICD-10-CM

## 2023-09-26 NOTE — Telephone Encounter (Signed)
 Requested medication (s) are due for refill today: yes  Requested medication (s) are on the active medication list: yes  Last refill:  04/08/23  Future visit scheduled:yes Notes to clinic:  abnormal lab work    Requested Prescriptions  Pending Prescriptions Disp Refills   hydrALAZINE  (APRESOLINE ) 25 MG tablet [Pharmacy Med Name: HYDRALAZINE  25 MG TABLET] 180 tablet     Sig: TAKE 1 TABLET BY MOUTH TWICE A DAY     Cardiovascular:  Vasodilators Failed - 09/26/2023  2:57 PM      Failed - WBC in normal range and within 360 days    WBC  Date Value Ref Range Status  04/08/2023 12.4 (H) 3.8 - 10.8 Thousand/uL Final         Failed - ANA Screen, Ifa, Serum in normal range and within 360 days    No results found for: ANA, ANATITER, LABANTI       Failed - Last BP in normal range    BP Readings from Last 1 Encounters:  08/07/23 (!) 189/83         Passed - HCT in normal range and within 360 days    HCT  Date Value Ref Range Status  04/08/2023 43.4 35.0 - 45.0 % Final   Hematocrit  Date Value Ref Range Status  03/13/2017 44.7 34.0 - 46.6 % Final         Passed - HGB in normal range and within 360 days    Hemoglobin  Date Value Ref Range Status  04/08/2023 14.3 11.7 - 15.5 g/dL Final  98/82/7980 85.0 11.1 - 15.9 g/dL Final         Passed - RBC in normal range and within 360 days    RBC  Date Value Ref Range Status  04/08/2023 4.90 3.80 - 5.10 Million/uL Final         Passed - PLT in normal range and within 360 days    Platelets  Date Value Ref Range Status  04/08/2023 311 140 - 400 Thousand/uL Final  03/13/2017 291 150 - 379 x10E3/uL Final         Passed - Valid encounter within last 12 months    Recent Outpatient Visits           3 months ago Essential hypertension   [place] [facility] [place], Angeline ORN, NP   4 months ago ETD (Eustachian tube dysfunction), bilateral   [place] [facility] [place], Angeline ORN, NP   4 months ago  Non-recurrent acute suppurative otitis media of left ear without spontaneous rupture of tympanic membrane   [place] [facility], MD   5 months ago Essential hypertension   [place] [facility] [place], [place] W, NP   5 months ago ETD (Eustachian tube dysfunction), left   [place] [facility] [place], Angeline ORN, NP              Signed Prescriptions Disp Refills   rosuvastatin  (CRESTOR ) 20 MG tablet 90 tablet 3    Sig: TAKE 1 TABLET BY MOUTH EVERY DAY     Cardiovascular:  Antilipid - Statins 2 Failed - 09/26/2023  2:57 PM      Failed - Lipid Panel in normal range within the last 12 months    Cholesterol, Total  Date Value Ref Range Status  01/02/2018 300 (H) 100 - 199 mg/dL Final   Cholesterol  Date Value  Ref Range Status  08/26/2023 373 (H) <200 mg/dL Final   LDL Cholesterol (Calc)  Date Value Ref Range Status  08/26/2023 297 (H) mg/dL (calc) Final    Comment:    LDL-C levels > or = 190 mg/dL may indicate familial  hypercholesterolemia (FH). Clinical assessment and  measurement of blood lipid levels should be  considered for all first degree relatives of patients with an FH diagnosis. LDL Cholesterol (LDL-C) levels > or = 300 mg/dL may indicate homozygous familial hypercholesterolemia (HoFH). Untreated,  these extremely high LDL-C levels can result in premature CV events and mortality. Patients should be identified early and provided appropriate interventions to reduce the cumulative LDL-C burden from birth. . For questions about testing for familial hypercholesterolemia, please call Engineer, materials at 1.866.GENE.INFO. SABRA Veatrice DASEN, et al. J National Lipid Association  Recommendations for Patient-Centered Management of Dyslipidemia: Part 1 Journal of  Clinical Lipidology 2015;9(2), 129-169. Cuchel, M. et al. (2014). Homozygous familial hypercholesterolaemia: new  insights and guidance for clinicians to improve detection and clinical management. European Heart Journal, 35(32), [phone]. Reference range: <100 . Desirable range <100 mg/dL for primary prevention;   <70 mg/dL for patients with CHD or diabetic patients  with > or = 2 CHD risk factors. SABRA LDL-C is now calculated using the Martin-Hopkins  calculation, which is a validated novel method providing  better accuracy than the Friedewald equation in the  estimation of LDL-C.  Gladis APPLETHWAITE et al. SANDREA. 7986;689(80): 2061-2068  (http://education.QuestDiagnostics.com/faq/FAQ164)    HDL  Date Value Ref Range Status  08/26/2023 39 (L) > OR = 50 mg/dL Final  88/91/7980 38 (L) >39 mg/dL Final   Triglycerides  Date Value Ref Range Status  08/26/2023 190 (H) <150 mg/dL Final         Passed - Cr in normal range and within 360 days    Creat  Date Value Ref Range Status  08/26/2023 0.92 0.50 - 1.03 mg/dL Final         Passed - Patient is not pregnant      Passed - Valid encounter within last 12 months    Recent Outpatient Visits           3 months ago Essential hypertension   [place] [facility] [place], Angeline ORN, NP   4 months ago ETD (Eustachian tube dysfunction), bilateral   [place] [facility] [place], [place] W, NP   4 months ago Non-recurrent acute suppurative otitis media of left ear without spontaneous rupture of tympanic membrane   [place] [facility] MD   5 months ago Essential hypertension   [place] [facility] [place], Angeline ORN, NP   5 months ago ETD (Eustachian tube dysfunction), left   [place] [facility] [place], Angeline ORN, NP              Refused Prescriptions Disp Refills   FLUoxetine  (PROZAC ) 10 MG capsule [Pharmacy Med Name: FLUOXETINE  HCL 10 MG CAPSULE] 180 capsule     Sig: TAKE 2 CAPSULES BY MOUTH DAILY     Psychiatry:   Antidepressants - SSRI Passed - 09/26/2023  2:57 PM      Passed - Valid encounter within last 6 months    Recent Outpatient Visits           3 months ago Essential hypertension    [facility] [place], Angeline ORN, [place]  4 months ago ETD (Eustachian tube dysfunction), bilateral   [place] [facility] [place], [place], NP   4 months ago Non-recurrent acute suppurative otitis media of left ear without spontaneous rupture of tympanic membrane   [place] [facility], MD   5 months ago Essential hypertension   [place] [facility] [place], Angeline ORN, NP   5 months ago ETD (Eustachian tube dysfunction), left   [place] [facility] [place], Angeline ORN, [place]

## 2023-09-26 NOTE — Telephone Encounter (Signed)
 Requested Prescriptions  Pending Prescriptions Disp Refills   hydrALAZINE  (APRESOLINE ) 25 MG tablet [Pharmacy Med Name: HYDRALAZINE  25 MG TABLET] 180 tablet     Sig: TAKE 1 TABLET BY MOUTH TWICE A DAY     Cardiovascular:  Vasodilators Failed - 09/26/2023  2:54 PM      Failed - WBC in normal range and within 360 days    WBC  Date Value Ref Range Status  04/08/2023 12.4 (H) 3.8 - 10.8 Thousand/uL Final         Failed - ANA Screen, Ifa, Serum in normal range and within 360 days    No results found for: ANA, ANATITER, LABANTI       Failed - Last BP in normal range    BP Readings from Last 1 Encounters:  08/07/23 (!) 189/83         Passed - HCT in normal range and within 360 days    HCT  Date Value Ref Range Status  04/08/2023 43.4 35.0 - 45.0 % Final   Hematocrit  Date Value Ref Range Status  03/13/2017 44.7 34.0 - 46.6 % Final         Passed - HGB in normal range and within 360 days    Hemoglobin  Date Value Ref Range Status  04/08/2023 14.3 11.7 - 15.5 g/dL Final  98/82/7980 85.0 11.1 - 15.9 g/dL Final         Passed - RBC in normal range and within 360 days    RBC  Date Value Ref Range Status  04/08/2023 4.90 3.80 - 5.10 Million/uL Final         Passed - PLT in normal range and within 360 days    Platelets  Date Value Ref Range Status  04/08/2023 311 140 - 400 Thousand/uL Final  03/13/2017 291 150 - 379 x10E3/uL Final         Passed - Valid encounter within last 12 months    Recent Outpatient Visits           3 months ago Essential hypertension   [facility] [place], Angeline ORN, NP   4 months ago ETD (Eustachian tube dysfunction), bilateral   [place] [facility] [place], [place] W, NP   4 months ago Non-recurrent acute suppurative otitis media of left ear without spontaneous rupture of tympanic membrane   [place] [facility], MD   5 months ago Essential  hypertension   [place] [facility] [place], [place] W, NP   5 months ago ETD (Eustachian tube dysfunction), left   [place] [facility] [place], [place] W, NP               rosuvastatin  (CRESTOR ) 20 MG tablet [Pharmacy Med Name: ROSUVASTATIN  CALCIUM  20 MG TAB] 90 tablet 3    Sig: TAKE 1 TABLET BY MOUTH EVERY DAY     Cardiovascular:  Antilipid - Statins 2 Failed - 09/26/2023  2:54 PM      Failed - Lipid Panel in normal range within the last 12 months    Cholesterol, Total  Date Value Ref Range Status  01/02/2018 300 (H) 100 - 199 mg/dL Final   Cholesterol  Date Value Ref Range Status  08/26/2023 373 (H) <200 mg/dL Final   LDL Cholesterol (Calc)  Date Value Ref Range Status  08/26/2023 297 (H) mg/dL (calc) Final    Comment:    LDL-C  levels > or = 190 mg/dL may indicate familial  hypercholesterolemia (FH). Clinical assessment and  measurement of blood lipid levels should be  considered for all first degree relatives of patients with an FH diagnosis. LDL Cholesterol (LDL-C) levels > or = 300 mg/dL may indicate homozygous familial hypercholesterolemia (HoFH). Untreated,  these extremely high LDL-C levels can result in premature CV events and mortality. Patients should be identified early and provided appropriate interventions to reduce the cumulative LDL-C burden from birth. . For questions about testing for familial hypercholesterolemia, please call Engineer, materials at [email or web address].INFO. SABRA Veatrice DASEN, et al. J National Lipid Association  Recommendations for Patient-Centered Management of Dyslipidemia: Part 1 Journal of  Clinical Lipidology 2015;9(2), 129-169. Cuchel, M. et al. (2014). Homozygous familial hypercholesterolaemia: new insights and guidance for clinicians to improve detection and clinical management. European Heart Journal, 35(32), [phone]. Reference range: <100 . Desirable range <100 mg/dL for primary  prevention;   <70 mg/dL for patients with CHD or diabetic patients  with > or = 2 CHD risk factors. SABRA LDL-C is now calculated using the Martin-Hopkins  calculation, which is a validated novel method providing  better accuracy than the Friedewald equation in the  estimation of LDL-C.  Gladis APPLETHWAITE et al. SANDREA. 7986;689(80): 2061-2068  (http://education.Quest[employer].com/faq/FAQ164)    HDL  Date Value Ref Range Status  08/26/2023 39 (L) > OR = 50 mg/dL Final  88/91/7980 38 (L) >39 mg/dL Final   Triglycerides  Date Value Ref Range Status  08/26/2023 190 (H) <150 mg/dL Final         Passed - Cr in normal range and within 360 days    Creat  Date Value Ref Range Status  08/26/2023 0.92 0.50 - 1.03 mg/dL Final         Passed - Patient is not pregnant      Passed - Valid encounter within last 12 months    Recent Outpatient Visits           3 months ago Essential hypertension   [place] [facility] [place], Angeline ORN, NP   4 months ago ETD (Eustachian tube dysfunction), bilateral   [place] [facility] [place], [place] W, NP   4 months ago Non-recurrent acute suppurative otitis media of left ear without spontaneous rupture of tympanic membrane   [place] [facility], MD   5 months ago Essential hypertension   [place] [facility] [place], Angeline ORN, NP   5 months ago ETD (Eustachian tube dysfunction), left   [place] [facility] [place], Angeline ORN, NP              Refused Prescriptions Disp Refills   FLUoxetine  (PROZAC ) 10 MG capsule [Pharmacy Med Name: FLUOXETINE  HCL 10 MG CAPSULE] 180 capsule     Sig: TAKE 2 CAPSULES BY MOUTH DAILY     Psychiatry:  Antidepressants - SSRI Passed - 09/26/2023  2:54 PM      Passed - Valid encounter within last 6 months    Recent Outpatient Visits           3 months ago Essential hypertension   [place] [facility] [place], Angeline ORN, NP   4 months ago ETD (Eustachian tube dysfunction), bilateral   [place] [facility] [place], [place] W, NP   4 months ago Non-recurrent acute suppurative otitis media of left ear without  spontaneous rupture of tympanic membrane   [place] [facility], MD   5 months ago Essential hypertension   [place] [facility] [place], Angeline ORN, NP   5 months ago ETD (Eustachian tube dysfunction), left   [facility] [place], Angeline ORN, [place]

## 2023-10-02 ENCOUNTER — Ambulatory Visit: Admitting: Internal Medicine

## 2023-10-02 VITALS — BP 160/90 | Ht 62.0 in | Wt 187.2 lb

## 2023-10-02 DIAGNOSIS — E782 Mixed hyperlipidemia: Secondary | ICD-10-CM

## 2023-10-02 DIAGNOSIS — G4733 Obstructive sleep apnea (adult) (pediatric): Secondary | ICD-10-CM | POA: Diagnosis not present

## 2023-10-02 DIAGNOSIS — F5104 Psychophysiologic insomnia: Secondary | ICD-10-CM

## 2023-10-02 DIAGNOSIS — E66811 Obesity, class 1: Secondary | ICD-10-CM

## 2023-10-02 DIAGNOSIS — E6609 Other obesity due to excess calories: Secondary | ICD-10-CM

## 2023-10-02 DIAGNOSIS — Z6834 Body mass index (BMI) 34.0-34.9, adult: Secondary | ICD-10-CM

## 2023-10-02 DIAGNOSIS — K219 Gastro-esophageal reflux disease without esophagitis: Secondary | ICD-10-CM

## 2023-10-02 DIAGNOSIS — F419 Anxiety disorder, unspecified: Secondary | ICD-10-CM

## 2023-10-02 DIAGNOSIS — I10 Essential (primary) hypertension: Secondary | ICD-10-CM

## 2023-10-02 MED ORDER — ROSUVASTATIN CALCIUM 40 MG PO TABS
40.0000 mg | ORAL_TABLET | Freq: Every day | ORAL | 1 refills | Status: AC
Start: 2023-10-02 — End: ?

## 2023-10-02 MED ORDER — GABAPENTIN 100 MG PO CAPS: 100.0000 mg | ORAL_CAPSULE | Freq: Every day | ORAL | 1 refills | Status: AC

## 2023-10-02 NOTE — Patient Instructions (Signed)

## 2023-10-02 NOTE — Assessment & Plan Note (Signed)
 Not medicated We will monitor

## 2023-10-02 NOTE — Assessment & Plan Note (Signed)
 Encouraged diet and exercise for weight loss ?

## 2023-10-02 NOTE — Assessment & Plan Note (Signed)
Encourage weight loss as this can reduce sleep apnea symptoms Noncompliant with CPAP 

## 2023-10-02 NOTE — Assessment & Plan Note (Signed)
 Will increase rosuvastatin  to 40 mg daily Discussed adding repatha 40 mg 3 times monthly Encouraged her to consume low-fat diet Will check lipid panel in 6 weeks, lab

## 2023-10-02 NOTE — Assessment & Plan Note (Signed)
 Avoid foods that trigger reflux Encourage weight loss as this can help reduce reflux symptoms Okay to take tums OTC as needed

## 2023-10-02 NOTE — Assessment & Plan Note (Signed)
 Remains elevated Continue amlodipine -olmesartan  10-40 mg daily Advised adherence to prescribed regimen of hydralazine  25 mg twice daily, will consider increasing this to 3 times daily Reinforced DASH diet and exercise for weight loss

## 2023-10-02 NOTE — Progress Notes (Signed)
 Subjective:    Patient ID: [name], female    DOB: Feb 19, 1966, 58 y.o.   MRN: [id number]  HPI   Patient presents to clinic today for follow-up of chronic conditions.    HTN: Her BP today is 162/98 she is taking amlodipine -olmesartan  and hydralazine  but admits she often forgets her second dose of her hydralazine .  She has been under a lot of stress lately, attributing this to the reason that her blood pressure is also elevated.  ECG from 05/2016 reviewed.  OSA: She 5 averages hours of sleep per night without the use of her CPAP.  There is no sleep study on file.  GERD: Triggered by spicy and tomato based. She takes tums as needed with good relief of symptoms. There is no upper GI on file.  Insomnia: She has difficulty falling asleep. She no longer takes zolpidem . There is no sleep study on file.  Anxiety: Chronic, managed on fluoxetine . She is not currently seeing a therapist. She denies depression, SI/HI.  HLD: Her last LDL was 297, triglycerides 190, 08/2023. She denies myalgias on rosuvastatin . She has been trying to consume a low fat diet.  She also reports paresthesias of her hands, reporting that the right is worse than left.  She reports numbness and tingling in all of her fingers but worse in the thumb, 1st and 2nd fingers.  Initially, she noticed this at night and thought that she just slept wrong.  She has started noticing symptoms throughout the day.  She has tried ibuprofen OTC with minimal relief of symptoms.  Review of Systems     Past Medical History:  Diagnosis Date   Anxiety Last year   Fatigue    Heart murmur N/A   Hyperlipemia    Hypertension Last year   Irregular menstrual cycle    Obesity     Current Outpatient Medications  Medication Sig Dispense Refill   amLODipine -olmesartan  (AZOR ) 10-40 MG tablet Take 1 tablet by mouth daily. 90 tablet 1   FLUoxetine  (PROZAC ) 10 MG capsule Take 1 capsule (10 mg total) by mouth daily.     hydrALAZINE   (APRESOLINE ) 25 MG tablet TAKE 1 TABLET BY MOUTH TWICE A DAY 180 tablet 0   rosuvastatin  (CRESTOR ) 20 MG tablet TAKE 1 TABLET BY MOUTH EVERY DAY 90 tablet 3   No current facility-administered medications for this visit.    No Known Allergies  Family History  Problem Relation Age of Onset   Hypertension Mother    Heart disease Mother    Kidney disease Mother    Heart disease Father    Hypertension Father    Thyroid  disease Father    Heart attack Father 69   CAD Father    Kidney failure Father    Heart failure Maternal Grandmother    Lung cancer Maternal Grandfather    Cancer Paternal Grandmother    Coronary artery disease Paternal Grandmother    Cancer Paternal Grandfather    Diabetes Neg Hx    Breast cancer Neg Hx    Colon cancer Neg Hx     Social History   Socioeconomic History   Marital status: Married    Spouse name: Not on file   Number of children: Not on file   Years of education: Not on file   Highest education level: High school graduate  Occupational History   Not on file  Tobacco Use   Smoking status: Every Day    Current packs/day: 1.50    Average packs/day:  1.5 packs/day for 30.5 years (45.8 ttl pk-yrs)    Types: Cigarettes    Start date: 03/23/1993   Smokeless tobacco: Never  Vaping Use   Vaping status: Never Used  Substance and Sexual Activity   Alcohol use: No    Alcohol/week: 0.0 standard drinks of alcohol   Drug use: No   Sexual activity: Yes    Birth control/protection: Post-menopausal  Other Topics Concern   Not on file  Social History Narrative   Not on file   Social Drivers of Health   Financial Resource Strain: Low Risk  (02/11/2018)   Overall Financial Resource Strain (CARDIA)    Difficulty of Paying Living Expenses: Not very hard  Food Insecurity: No Food Insecurity (02/11/2018)   Hunger Vital Sign    Worried About Running Out of Food in the Last Year: Never true    Ran Out of Food in the Last Year: Never true  Transportation  Needs: No Transportation Needs (02/11/2018)   PRAPARE - Administrator, Civil Service (Medical): No    Lack of Transportation (Non-Medical): No  Physical Activity: Inactive (02/11/2018)   Exercise Vital Sign    Days of Exercise per Week: 0 days    Minutes of Exercise per Session: 0 min  Stress: No Stress Concern Present (02/11/2018)   [employer] of Occupational Health - Occupational Stress Questionnaire    Feeling of Stress : Only a little  Social Connections: Not on file  Intimate Partner Violence: Not At Risk (02/11/2018)   Humiliation, Afraid, Rape, and Kick questionnaire    Fear of Current or Ex-Partner: No    Emotionally Abused: No    Physically Abused: No    Sexually Abused: No     Constitutional: Denies fever, malaise, fatigue, headache or abrupt weight changes.  HEENT: Denies eye pain, eye redness, ear pain, ringing in the ears, wax buildup, runny nose, nasal congestion, bloody nose, or sore throat. Respiratory: Denies difficulty breathing, shortness of breath, cough or sputum production.   Cardiovascular: Denies chest pain, chest tightness, palpitations or swelling in the hands or feet.  Gastrointestinal: Denies abdominal pain, bloating, constipation, diarrhea or blood in the stool.  GU: Denies urgency, frequency, pain with urination, burning sensation, blood in urine, odor or discharge. Musculoskeletal: Denies decrease in range of motion, difficulty with gait, muscle pain or joint pain and swelling.  Skin: Denies redness, rashes, lesions or ulcercations.  Neurological: Pt reports insomnia, paresthesia of hands. Denies dizziness, difficulty with memory, difficulty with speech or problems with balance and coordination.  Psych: Pt  has a history of anxiety. Denies depression, SI/HI.  No other specific complaints in a complete review of systems (except as listed in HPI above).  Objective:   Physical Exam  BP (!) 162/98 (BP Location: Right Arm, Patient  Position: Sitting, Cuff Size: Normal)   Ht 5' 2 (1.575 m)   Wt 187 lb 3.2 oz (84.9 kg)   BMI 34.24 kg/m    Wt Readings from Last 3 Encounters:  08/07/23 178 lb 9.6 oz (81 kg)  06/05/23 184 lb (83.5 kg)  05/22/23 184 lb 3.2 oz (83.6 kg)    General: Appears her stated age, obese, in NAD. Skin: Warm, dry and intact.  Sun damage skin noted. HEENT: Head: normal shape and size; Eyes: sclera white, no icterus, conjunctiva pink, PERRLA and EOMs intact;  Cardiovascular: Normal rate and rhythm. S1,S2 noted.  No murmur, rubs or gallops noted. No JVD or BLE edema. No carotid bruits noted.  Pulmonary/Chest: Normal effort and positive vesicular breath sounds. No respiratory distress. No wheezes, rales or ronchi noted.  Abdomen: Soft and nontender. Normal bowel sounds.  Musculoskeletal: Strength 5/5 BLE.  No difficulty with gait.  Neurological: Positive Phalen's, positive Tinel's bilaterally.  Alert and oriented. Coordination normal.  Psychiatric: Mood and affect normal. Behavior is normal. Judgment and thought content normal.    BMET    Component Value Date/Time   NA 140 08/26/2023 0820   NA 144 03/18/2018 0922   NA 140 06/16/2013 1919   K 4.6 08/26/2023 0820   K 3.5 06/16/2013 1919   CL 106 08/26/2023 0820   CL 107 06/16/2013 1919   CO2 27 08/26/2023 0820   CO2 27 06/16/2013 1919   GLUCOSE 86 08/26/2023 0820   GLUCOSE 127 (H) 06/16/2013 1919   BUN 11 08/26/2023 0820   BUN 11 03/18/2018 0922   BUN 13 06/16/2013 1919   CREATININE 0.92 08/26/2023 0820   CALCIUM  9.6 08/26/2023 0820   CALCIUM  8.7 06/16/2013 1919   GFRNONAA 88 09/01/2020 0753   GFRAA 102 09/01/2020 0753    Lipid Panel     Component Value Date/Time   CHOL 373 (H) 08/26/2023 0820   CHOL 300 (H) 01/02/2018 1030   TRIG 190 (H) 08/26/2023 0820   HDL 39 (L) 08/26/2023 0820   HDL 38 (L) 01/02/2018 1030   CHOLHDL 9.6 (H) 08/26/2023 0820   LDLCALC 297 (H) 08/26/2023 0820    CBC    Component Value Date/Time   WBC  12.4 (H) 04/08/2023 1515   RBC 4.90 04/08/2023 1515   HGB 14.3 04/08/2023 1515   HGB 14.9 03/13/2017 0843   HCT 43.4 04/08/2023 1515   HCT 44.7 03/13/2017 0843   PLT 311 04/08/2023 1515   PLT 291 03/13/2017 0843   MCV 88.6 04/08/2023 1515   MCV 91 03/13/2017 0843   MCV 88 06/16/2013 1919   MCH 29.2 04/08/2023 1515   MCHC 32.9 04/08/2023 1515   RDW 12.7 04/08/2023 1515   RDW 13.3 03/13/2017 0843   RDW 12.9 06/16/2013 1919   LYMPHSABS 2,594 05/26/2019 0908   LYMPHSABS 2.9 03/13/2017 0843   EOSABS 254 05/26/2019 0908   EOSABS 0.2 03/13/2017 0843   BASOSABS 85 05/26/2019 0908   BASOSABS 0.1 03/13/2017 0843    Hgb A1C Lab Results  Component Value Date   HGBA1C 5.1 04/08/2023           Assessment & Plan:     RTC in 2 weeks, follow-up HTN 6 months for your annual exam Angeline Laura, NP

## 2023-10-02 NOTE — Assessment & Plan Note (Signed)
 Continue fluoxetine  to 10 mg daily Support offered

## 2023-10-09 ENCOUNTER — Encounter: Payer: Self-pay | Admitting: Internal Medicine

## 2023-10-20 ENCOUNTER — Encounter: Payer: Self-pay | Admitting: Internal Medicine

## 2023-10-20 ENCOUNTER — Ambulatory Visit: Payer: No Typology Code available for payment source | Admitting: Internal Medicine

## 2023-10-20 VITALS — BP 144/88 | Ht 62.0 in | Wt 186.4 lb

## 2023-10-20 DIAGNOSIS — I10 Essential (primary) hypertension: Secondary | ICD-10-CM | POA: Diagnosis not present

## 2023-10-20 DIAGNOSIS — Z6834 Body mass index (BMI) 34.0-34.9, adult: Secondary | ICD-10-CM

## 2023-10-20 DIAGNOSIS — E6609 Other obesity due to excess calories: Secondary | ICD-10-CM

## 2023-10-20 DIAGNOSIS — E66811 Obesity, class 1: Secondary | ICD-10-CM

## 2023-10-20 DIAGNOSIS — R232 Flushing: Secondary | ICD-10-CM

## 2023-10-20 DIAGNOSIS — R202 Paresthesia of skin: Secondary | ICD-10-CM | POA: Diagnosis not present

## 2023-10-20 MED ORDER — HYDRALAZINE HCL 25 MG PO TABS: 25.0000 mg | ORAL_TABLET | Freq: Three times a day (TID) | ORAL | Status: DC

## 2023-10-20 NOTE — Progress Notes (Signed)
 Subjective:    Patient ID: [name], female    DOB: [birth year], 58 y.o.   MRN: [id number]  HPI   Discussed the use of AI scribe software for clinical note transcription with the patient, who gave verbal consent to proceed.  [name] is a 58 year old female with hypertension who presents for follow-up on blood pressure management.  She has been taking amlodipine  and olmesartan  10-40 mg, but had not been consistently taking hydralazine  25 mg twice a day. Recently, she has started taking hydralazine  as prescribed, and her blood pressure has decreased but remains slightly elevated. Her BP today is 148/80  She inquired about weight loss options, noting that her insurance does not cover weight loss medications. metformin and wellbutrin  were suggested by her insurance. She admits that she does not consume a healthy diet and has never tried calorie counting or other methods to lose weight.   She reports experiencing hot flashes over the past week, despite having gone through menopause in her forties. She has not taken anything OTC for this.  She is currently taking gabapentin  100 mg for hand paresthesia, which has provided some relief. She wears braces at night and for a few hours during the day, which has helped reduce symptoms, although they still occur occasionally.       Review of Systems     Past Medical History:  Diagnosis Date   Anxiety Last year   Fatigue    Heart murmur N/A   Hyperlipemia    Hypertension Last year   Irregular menstrual cycle    Obesity     Current Outpatient Medications  Medication Sig Dispense Refill   amLODipine -olmesartan  (AZOR ) 10-40 MG tablet Take 1 tablet by mouth daily. 90 tablet 1   FLUoxetine  (PROZAC ) 10 MG capsule Take 1 capsule (10 mg total) by mouth daily.     gabapentin  (NEURONTIN ) 100 MG capsule Take 1 capsule (100 mg total) by mouth at bedtime. 90 capsule 1   hydrALAZINE  (APRESOLINE ) 25 MG tablet TAKE 1 TABLET BY MOUTH TWICE A DAY  180 tablet 0   rosuvastatin  (CRESTOR ) 40 MG tablet Take 1 tablet (40 mg total) by mouth daily. 90 tablet 1   No current facility-administered medications for this visit.    No Known Allergies  Family History  Problem Relation Age of Onset   Hypertension Mother    Heart disease Mother    Kidney disease Mother    Heart disease Father    Hypertension Father    Thyroid  disease Father    Heart attack Father 36   CAD Father    Kidney failure Father    Heart failure Maternal Grandmother    Lung cancer Maternal Grandfather    Cancer Paternal Grandmother    Coronary artery disease Paternal Grandmother    Cancer Paternal Grandfather    Diabetes Neg Hx    Breast cancer Neg Hx    Colon cancer Neg Hx     Social History   Socioeconomic History   Marital status: Married    Spouse name: Not on file   Number of children: Not on file   Years of education: Not on file   Highest education level: 12th grade  Occupational History   Not on file  Tobacco Use   Smoking status: Every Day    Current packs/day: 1.50    Average packs/day: 1.5 packs/day for 30.6 years (45.9 ttl pk-yrs)    Types: Cigarettes    Start date: 03/23/1993  Smokeless tobacco: Never  Vaping Use   Vaping status: Never Used  Substance and Sexual Activity   Alcohol use: No    Alcohol/week: 0.0 standard drinks of alcohol   Drug use: No   Sexual activity: Yes    Birth control/protection: Post-menopausal  Other Topics Concern   Not on file  Social History Narrative   Not on file   Social Drivers of Health   Financial Resource Strain: Low Risk  (10/02/2023)   Overall Financial Resource Strain (CARDIA)    Difficulty of Paying Living Expenses: Not hard at all  Food Insecurity: No Food Insecurity (10/02/2023)   Hunger Vital Sign    Worried About Running Out of Food in the Last Year: Never true    Ran Out of Food in the Last Year: Never true  Transportation Needs: No Transportation Needs (10/02/2023)   PRAPARE -  Administrator, Civil Service (Medical): No    Lack of Transportation (Non-Medical): No  Physical Activity: Inactive (10/02/2023)   Exercise Vital Sign    Days of Exercise per Week: 0 days    Minutes of Exercise per Session: Not on file  Stress: No Stress Concern Present (10/02/2023)   [employer] of Occupational Health - Occupational Stress Questionnaire    Feeling of Stress: Only a little  Social Connections: Moderately Isolated (10/02/2023)   Social Connection and Isolation Panel    Frequency of Communication with Friends and Family: More than three times a week    Frequency of Social Gatherings with Friends and Family: Twice a week    Attends Religious Services: Never    Database administrator or Organizations: No    Attends Engineer, structural: Not on file    Marital Status: Married  Catering manager Violence: Not At Risk (02/11/2018)   Humiliation, Afraid, Rape, and Kick questionnaire    Fear of Current or Ex-Partner: No    Emotionally Abused: No    Physically Abused: No    Sexually Abused: No     Constitutional: Denies fever, malaise, fatigue, headache or abrupt weight changes.  HEENT: Denies eye pain, eye redness, ear pain, ringing in the ears, wax buildup, runny nose, nasal congestion, bloody nose, or sore throat. Respiratory: Denies difficulty breathing, shortness of breath, cough or sputum production.   Cardiovascular: Denies chest pain, chest tightness, palpitations or swelling in the hands or feet.  Gastrointestinal: Denies abdominal pain, bloating, constipation, diarrhea or blood in the stool.  GU: Pt reports amenorrhea. Denies urgency, frequency, pain with urination, burning sensation, blood in urine, odor or discharge. Musculoskeletal: Denies decrease in range of motion, difficulty with gait, muscle pain or joint pain and swelling.  Skin: Denies redness, rashes, lesions or ulcercations.  Neurological: Pt reports insomnia, paresthesia of hands,  hot flashes. Denies dizziness, difficulty with memory, difficulty with speech or problems with balance and coordination.  Psych: Pt  has a history of anxiety. Denies depression, SI/HI.  No other specific complaints in a complete review of systems (except as listed in HPI above).  Objective:   Physical Exam  BP (!) 148/80 (BP Location: Right Arm, Patient Position: Sitting, Cuff Size: Normal)   Ht 5' 2 (1.575 m)   Wt 186 lb 6.4 oz (84.6 kg)   BMI 34.09 kg/m     Wt Readings from Last 3 Encounters:  10/02/23 187 lb 3.2 oz (84.9 kg)  08/07/23 178 lb 9.6 oz (81 kg)  06/05/23 184 lb (83.5 kg)    General: Appears  her stated age, obese, in NAD. Skin: Warm, dry and intact.   HEENT: Head: normal shape and size; Eyes: sclera white, no icterus, conjunctiva pink, PERRLA and EOMs intact;  Cardiovascular: Normal rate and rhythm. S1,S2 noted.  No murmur, rubs or gallops noted. No JVD or BLE edema. No carotid bruits noted. Pulmonary/Chest: Normal effort and positive vesicular breath sounds. No respiratory distress. No wheezes, rales or ronchi noted.  Musculoskeletal: No difficulty with gait.  Neurological: Positive Phalen's, positive Tinel's bilaterally.  Alert and oriented. Coordination normal.    BMET    Component Value Date/Time   NA 140 08/26/2023 0820   NA 144 03/18/2018 0922   NA 140 06/16/2013 1919   K 4.6 08/26/2023 0820   K 3.5 06/16/2013 1919   CL 106 08/26/2023 0820   CL 107 06/16/2013 1919   CO2 27 08/26/2023 0820   CO2 27 06/16/2013 1919   GLUCOSE 86 08/26/2023 0820   GLUCOSE 127 (H) 06/16/2013 1919   BUN 11 08/26/2023 0820   BUN 11 03/18/2018 0922   BUN 13 06/16/2013 1919   CREATININE 0.92 08/26/2023 0820   CALCIUM  9.6 08/26/2023 0820   CALCIUM  8.7 06/16/2013 1919   GFRNONAA 88 09/01/2020 0753   GFRAA 102 09/01/2020 0753    Lipid Panel     Component Value Date/Time   CHOL 373 (H) 08/26/2023 0820   CHOL 300 (H) 01/02/2018 1030   TRIG 190 (H) 08/26/2023 0820    HDL 39 (L) 08/26/2023 0820   HDL 38 (L) 01/02/2018 1030   CHOLHDL 9.6 (H) 08/26/2023 0820   LDLCALC 297 (H) 08/26/2023 0820    CBC    Component Value Date/Time   WBC 12.4 (H) 04/08/2023 1515   RBC 4.90 04/08/2023 1515   HGB 14.3 04/08/2023 1515   HGB 14.9 03/13/2017 0843   HCT 43.4 04/08/2023 1515   HCT 44.7 03/13/2017 0843   PLT 311 04/08/2023 1515   PLT 291 03/13/2017 0843   MCV 88.6 04/08/2023 1515   MCV 91 03/13/2017 0843   MCV 88 06/16/2013 1919   MCH 29.2 04/08/2023 1515   MCHC 32.9 04/08/2023 1515   RDW 12.7 04/08/2023 1515   RDW 13.3 03/13/2017 0843   RDW 12.9 06/16/2013 1919   LYMPHSABS 2,594 05/26/2019 0908   LYMPHSABS 2.9 03/13/2017 0843   EOSABS 254 05/26/2019 0908   EOSABS 0.2 03/13/2017 0843   BASOSABS 85 05/26/2019 0908   BASOSABS 0.1 03/13/2017 0843    Hgb A1C Lab Results  Component Value Date   HGBA1C 5.1 04/08/2023           Assessment & Plan:   Assessment and Plan    Hypertension Hypertension remains slightly elevated despite current medication regimen. Improvement noted with adherence to hydralazine . - Increase hydralazine  to 25 mg three times a day. - Continue amlodipine -olmesartan  10-40 mg daily.  Obesity Obesity management complicated by lack of insurance coverage for weight loss medications. Non-pharmacological options discussed. - Advise calorie counting with a goal of less than 1400 calories per day. - Recommend eliminating sugar and carbohydrates, focusing on high protein and green vegetables. - Advise against over-the-counter weight loss supplements due to potential side effects.  Hand paresthesia Hand paresthesia showing some improvement with gabapentin  and use of wrist braces. Symptoms persist but are less severe at night. - Increase gabapentin  to 200 mg at bedtime. - Advise wearing wrist braces at night. - If symptoms persist after two weeks, increase gabapentin  to 300 mg at bedtime.  Menopausal symptoms Recent  onset of  hot flashes likely related to menopause. Symptoms have re-emerged after a long period without menstruation. - Recommend black cohosh or estroven for hot flashes.        RTC in 6 months for your annual exam Angeline Laura, NP

## 2023-10-20 NOTE — Patient Instructions (Signed)
 Pinched Nerve in the Wrist (Carpal Tunnel Syndrome): What to Know  Pinched nerve in the wrist (carpal tunnel syndrome, or CTS) is a nerve problem that causes pain, numbness, and weakness in the wrist, hand, and fingers. The carpal tunnel is a narrow space that is on the palm side of your wrist. Repeated wrist motions or certain diseases may cause swelling in the tunnel. This swelling can pinch the main nerve in the wrist (the median nerve). What are the causes? CTS may be caused by: Moving your hand and wrist over and over again while doing a task. Hurting the wrist. Arthritis. A pocket of fluid (cyst) or a growth (tumor) in the carpal tunnel. Fluid buildup when you are pregnant. Use of tools that vibrate. In some cases, the cause of CTS is not known. What increases the risk? You're more likely to have CTS if: You have a job that makes you do these things: Move your hand firmly over and over again. Work with tools that vibrate, such as drills or sanders. You're female. You have diabetes, obesity, thyroid problems, or kidney failure. What are the signs or symptoms? Symptoms of this condition include: A tingling feeling in your fingers. You may feel this pain in the thumb, index finger, or middle finger. Tingling or loss of feeling in your hand. Pain in your entire arm. This pain may get worse when you bend your wrist and elbow for a long time. Pain in your wrist that goes up your arm to your shoulder. Pain that goes down into your palm or fingers. Weakness in your hands. You may find it hard to grab and hold items. Your symptoms may feel worse during the night. How is this diagnosed? CTS is diagnosed with a medical history and physical exam. Tests and imaging may also be done to: Check the electrical signals sent by your nerves into the muscles. Check how well electrical signals pass through your nerves. Check possible causes of your CTS. These include X-rays, ultrasound, and  MRI. How is this treated? CTS may be treated with: Lifestyle changes. You will be asked to stop or change the activity that caused your problem. Physical therapy. This may include: Exercises that stretch and strengthen the muscles and tendons in the wrist and hand. Nerve gliding or flossing exercises. These help keep nerves moving smoothly through the tissues around them. Occupational therapy. You'll learn how to use your hand again. Medicines for pain and swelling. You may have injections in your wrist. A wrist splint or brace. Surgery. Follow these instructions at home: If you have a splint or brace: Wear the splint or brace as told. Take it off only if your provider says you can. Check the skin around it every day. Tell your provider if you see problems. Loosen the splint or brace if your fingers tingle, are numb, or turn cold and blue. Keep the splint or brace clean and dry. If the splint or brace isn't waterproof: Do not let it get wet. Cover it when you take a bath or shower. Use a cover that doesn't let any water in. Managing pain, stiffness, and swelling  Use ice or an ice pack as told. If you have a splint or brace that you can take off, remove it only as told. Place a towel between your skin and the ice. Leave the ice on for 20 minutes, 2-3 times a day. If your skin turns red, take off the ice right away to prevent skin damage. The risk  of damage is higher if you can't feel pain, heat, or cold. Move your fingers often to reduce stiffness and swelling. General instructions Take your medicines only as told. Rest your wrist and hand from activity that may cause pain. If your CTS is caused by things you do at work, talk with your employer about making changes. For example, you may need a wrist pad to use while typing. Exercise as told. Follow instructions on how to do nerve gliding or flossing exercises. These help keep nerves in moving smoothly through the tissues around  them. Keep all follow-up visits. This is important. Where to find more information American Academy of Orthopedic Surgeons: orthoinfo.aaos.[employer] of Neurological Disorders and Stroke: BasicFM.no Contact a health care provider if: You have new symptoms. Your pain is not controlled with medicines. Your symptoms get worse. Get help right away if: Your hand or wrist tingles or is numb, and the symptoms become very bad. This information is not intended to replace advice given to you by your health care provider. Make sure you discuss any questions you have with your health care provider. Document Revised: 12/24/2022 Document Reviewed: 10/11/2022 Elsevier Patient Education  2024 [employer].

## 2023-10-25 ENCOUNTER — Other Ambulatory Visit: Payer: Self-pay | Admitting: Internal Medicine

## 2023-10-28 NOTE — Telephone Encounter (Signed)
 Requested Prescriptions  Pending Prescriptions Disp Refills   amLODipine -olmesartan  (AZOR ) 10-40 MG tablet [Pharmacy Med Name: AMLODIPINE -OLMESARTAN  10-40 MG] 90 tablet 1    Sig: TAKE 1 TABLET BY MOUTH EVERY DAY     Cardiovascular: CCB + ARB Combos Failed - 10/28/2023  8:17 AM      Failed - Last BP in normal range    BP Readings from Last 1 Encounters:  10/20/23 (!) 144/88         Passed - K in normal range and within 180 days    Potassium  Date Value Ref Range Status  08/26/2023 4.6 3.5 - 5.3 mmol/L Final  06/16/2013 3.5 3.5 - 5.1 mmol/L Final         Passed - Cr in normal range and within 180 days    Creat  Date Value Ref Range Status  08/26/2023 0.92 0.50 - 1.03 mg/dL Final         Passed - Na in normal range and within 180 days    Sodium  Date Value Ref Range Status  08/26/2023 140 135 - 146 mmol/L Final  03/18/2018 144 134 - 144 mmol/L Final  06/16/2013 140 136 - 145 mmol/L Final         Passed - Patient is not pregnant      Passed - Valid encounter within last 6 months    Recent Outpatient Visits           1 week ago Essential hypertension   [facility] [place], Angeline ORN, NP   3 weeks ago Mixed hyperlipidemia   [place] [facility] [place], Angeline ORN, NP   4 months ago Essential hypertension   [place] [facility] [place], Angeline ORN, NP   5 months ago ETD (Eustachian tube dysfunction), bilateral   [place] [facility] [place], [place] W, NP   5 months ago Non-recurrent acute suppurative otitis media of left ear without spontaneous rupture of tympanic membrane   [facility], MD

## 2023-12-10 ENCOUNTER — Encounter: Payer: Self-pay | Admitting: Internal Medicine

## 2023-12-11 ENCOUNTER — Encounter: Payer: Self-pay | Admitting: Internal Medicine

## 2023-12-11 ENCOUNTER — Ambulatory Visit: Admitting: Internal Medicine

## 2023-12-11 VITALS — BP 158/88 | Ht 62.0 in | Wt 187.6 lb

## 2023-12-11 DIAGNOSIS — R519 Headache, unspecified: Secondary | ICD-10-CM | POA: Diagnosis not present

## 2023-12-11 MED ORDER — METHYLPREDNISOLONE ACETATE 80 MG/ML IJ SUSP
80.0000 mg | Freq: Once | INTRAMUSCULAR | Status: AC
  Administered 2023-12-11: 80 mg via INTRAMUSCULAR

## 2023-12-11 NOTE — Progress Notes (Signed)
 Subjective:    Patient ID: [name], female    DOB: June 10, 1965, 58 y.o.   MRN: [id number]  HPI  Discussed the use of AI scribe software for clinical note transcription with the patient, who gave verbal consent to proceed.    [name] is a 58 year old female who presents with sharp, stabbing pain behind her left ear.  She has been experiencing sharp, stabbing pain behind her left ear for several days, described as both sharp and aching, and it intensifies with head movement. The pain originates behind the ear and radiates upwards. It is not tender to touch, and she denies any neck pain, although turning her head sometimes causes a pulling sensation, described as an aching pain.  She denies ear pain, drainage or difficulty hearing.  She has attempted to manage the pain with heat, cold, tylenol , and ibuprofen, but these measures have not provided significant relief. Several months ago, she consulted an ear, nose, and throat specialist, where her ear was examined and found to be fine. She received a steroid shot several months ago that provided relief by the next day, but the pain has since returned.  The persistent nature of the pain has been ongoing for several months, coming and going in intensity, and it has impacted her ability to care for her sick mother.       Review of Systems   Past Medical History:  Diagnosis Date   Anxiety Last year   Fatigue    Heart murmur N/A   Hyperlipemia    Hypertension Last year   Irregular menstrual cycle    Obesity     Current Outpatient Medications  Medication Sig Dispense Refill   amLODipine -olmesartan  (AZOR ) 10-40 MG tablet TAKE 1 TABLET BY MOUTH EVERY DAY 90 tablet 1   FLUoxetine  (PROZAC ) 10 MG capsule Take 1 capsule (10 mg total) by mouth daily.     gabapentin  (NEURONTIN ) 100 MG capsule Take 1 capsule (100 mg total) by mouth at bedtime. 90 capsule 1   hydrALAZINE  (APRESOLINE ) 25 MG tablet Take 1 tablet (25 mg total) by mouth 3  (three) times daily.     rosuvastatin  (CRESTOR ) 40 MG tablet Take 1 tablet (40 mg total) by mouth daily. 90 tablet 1   No current facility-administered medications for this visit.    No Known Allergies  Family History  Problem Relation Age of Onset   Hypertension Mother    Heart disease Mother    Kidney disease Mother    Heart disease Father    Hypertension Father    Thyroid  disease Father    Heart attack Father 79   CAD Father    Kidney failure Father    Heart failure Maternal Grandmother    Lung cancer Maternal Grandfather    Cancer Paternal Grandmother    Coronary artery disease Paternal Grandmother    Cancer Paternal Grandfather    Diabetes Neg Hx    Breast cancer Neg Hx    Colon cancer Neg Hx     Social History   Socioeconomic History   Marital status: Married    Spouse name: Not on file   Number of children: Not on file   Years of education: Not on file   Highest education level: 12th grade  Occupational History   Not on file  Tobacco Use   Smoking status: Every Day    Current packs/day: 1.50    Average packs/day: 1.5 packs/day for 30.7 years (46.1 ttl pk-yrs)  Types: Cigarettes    Start date: 03/23/1993   Smokeless tobacco: Never  Vaping Use   Vaping status: Never Used  Substance and Sexual Activity   Alcohol use: No    Alcohol/week: 0.0 standard drinks of alcohol   Drug use: No   Sexual activity: Yes    Birth control/protection: Post-menopausal  Other Topics Concern   Not on file  Social History Narrative   Not on file   Social Drivers of Health   Financial Resource Strain: Low Risk  (10/02/2023)   Overall Financial Resource Strain (CARDIA)    Difficulty of Paying Living Expenses: Not hard at all  Food Insecurity: No Food Insecurity (10/02/2023)   Hunger Vital Sign    Worried About Running Out of Food in the Last Year: Never true    Ran Out of Food in the Last Year: Never true  Transportation Needs: No Transportation Needs (10/02/2023)   PRAPARE  - Administrator, Civil Service (Medical): No    Lack of Transportation (Non-Medical): No  Physical Activity: Inactive (10/02/2023)   Exercise Vital Sign    Days of Exercise per Week: 0 days    Minutes of Exercise per Session: Not on file  Stress: No Stress Concern Present (10/02/2023)   [employer] of Occupational Health - Occupational Stress Questionnaire    Feeling of Stress: Only a little  Social Connections: Moderately Isolated (10/02/2023)   Social Connection and Isolation Panel    Frequency of Communication with Friends and Family: More than three times a week    Frequency of Social Gatherings with Friends and Family: Twice a week    Attends Religious Services: Never    Database administrator or Organizations: No    Attends Engineer, structural: Not on file    Marital Status: Married  Catering manager Violence: Not At Risk (02/11/2018)   Humiliation, Afraid, Rape, and Kick questionnaire    Fear of Current or Ex-Partner: No    Emotionally Abused: No    Physically Abused: No    Sexually Abused: No     Constitutional: Denies fever, malaise, fatigue, headache or abrupt weight changes.  HEENT: Patient reports pain behind the left ear.  Denies eye pain, eye redness, ear pain, ringing in the ears, wax buildup, runny nose, nasal congestion, bloody nose, or sore throat. Respiratory: Denies difficulty breathing, shortness of breath, cough or sputum production.   Cardiovascular: Denies chest pain, chest tightness, palpitations or swelling in the hands or feet.  Musculoskeletal: Denies decrease in range of motion, difficulty with gait, muscle pain or joint pain and swelling.  Skin: Denies redness, rashes, lesions or ulcercations.  Neurological: Denies dizziness, difficulty with memory, difficulty with speech or problems with balance and coordination.    No other specific complaints in a complete review of systems (except as listed in HPI above).      Objective:    Physical Exam  BP (!) 158/88   Ht 5' 2 (1.575 m)   Wt 187 lb 9.6 oz (85.1 kg)   BMI 34.31 kg/m   Wt Readings from Last 3 Encounters:  10/20/23 186 lb 6.4 oz (84.6 kg)  10/02/23 187 lb 3.2 oz (84.9 kg)  08/07/23 178 lb 9.6 oz (81 kg)    General: Appears her stated age, obese, in NAD. Skin: Warm, dry and intact. No rashes, lesions or ulcerations noted. HEENT: Head: normal shape and size; Eyes: sclera white, no icterus, conjunctiva pink, PERRLA and EOMs intact; Ears: Tm's gray  and intact, normal light reflex;  Neck:  Neck supple, trachea midline. No masses, lumps or thyromegaly present.  Cardiovascular: Normal rate and rhythm.  Pulmonary/Chest: Normal effort and positive vesicular breath sounds. No respiratory distress. No wheezes, rales or ronchi noted.  Musculoskeletal: Normal flexion, extension and rotation of the cervical spine.  No bony tenderness noted over the cervical spine.  No difficulty with gait.  Neurological: Alert and oriented.    BMET    Component Value Date/Time   NA 140 08/26/2023 0820   NA 144 03/18/2018 0922   NA 140 06/16/2013 1919   K 4.6 08/26/2023 0820   K 3.5 06/16/2013 1919   CL 106 08/26/2023 0820   CL 107 06/16/2013 1919   CO2 27 08/26/2023 0820   CO2 27 06/16/2013 1919   GLUCOSE 86 08/26/2023 0820   GLUCOSE 127 (H) 06/16/2013 1919   BUN 11 08/26/2023 0820   BUN 11 03/18/2018 0922   BUN 13 06/16/2013 1919   CREATININE 0.92 08/26/2023 0820   CALCIUM  9.6 08/26/2023 0820   CALCIUM  8.7 06/16/2013 1919   GFRNONAA 88 09/01/2020 0753   GFRAA 102 09/01/2020 0753    Lipid Panel     Component Value Date/Time   CHOL 373 (H) 08/26/2023 0820   CHOL 300 (H) 01/02/2018 1030   TRIG 190 (H) 08/26/2023 0820   HDL 39 (L) 08/26/2023 0820   HDL 38 (L) 01/02/2018 1030   CHOLHDL 9.6 (H) 08/26/2023 0820   LDLCALC 297 (H) 08/26/2023 0820    CBC    Component Value Date/Time   WBC 12.4 (H) 04/08/2023 1515   RBC 4.90 04/08/2023 1515   HGB 14.3  04/08/2023 1515   HGB 14.9 03/13/2017 0843   HCT 43.4 04/08/2023 1515   HCT 44.7 03/13/2017 0843   PLT 311 04/08/2023 1515   PLT 291 03/13/2017 0843   MCV 88.6 04/08/2023 1515   MCV 91 03/13/2017 0843   MCV 88 06/16/2013 1919   MCH 29.2 04/08/2023 1515   MCHC 32.9 04/08/2023 1515   RDW 12.7 04/08/2023 1515   RDW 13.3 03/13/2017 0843   RDW 12.9 06/16/2013 1919   LYMPHSABS 2,594 05/26/2019 0908   LYMPHSABS 2.9 03/13/2017 0843   EOSABS 254 05/26/2019 0908   EOSABS 0.2 03/13/2017 0843   BASOSABS 85 05/26/2019 0908   BASOSABS 0.1 03/13/2017 0843    Hgb A1C Lab Results  Component Value Date   HGBA1C 5.1 04/08/2023            Assessment & Plan:   Assessment and Plan    Chronic left posterior auricular pain Chronic pain behind left ear, likely due to muscle inflammation or nerve impingement. Previous ENT evaluation unremarkable. Depo-Medrol  provided relief.  - Administer 80 mg Depo-Medrol  injection. - Consider x-ray cervical spine with subsequent MRI head if persist or worsens to rule out nerve impingement, mastoiditis     RTC in 4 months for your annual exam Angeline Laura, NP

## 2023-12-26 ENCOUNTER — Other Ambulatory Visit: Payer: Self-pay | Admitting: Internal Medicine

## 2023-12-27 NOTE — Telephone Encounter (Signed)
 Requested Prescriptions  Pending Prescriptions Disp Refills   hydrALAZINE  (APRESOLINE ) 25 MG tablet [Pharmacy Med Name: HYDRALAZINE  25 MG TABLET] 60 tablet 2    Sig: TAKE 1 TABLET BY MOUTH TWICE A DAY     Cardiovascular:  Vasodilators Failed - 12/27/2023  8:56 AM      Failed - WBC in normal range and within 360 days    WBC  Date Value Ref Range Status  04/08/2023 12.4 (H) 3.8 - 10.8 Thousand/uL Final         Failed - ANA Screen, Ifa, Serum in normal range and within 360 days    No results found for: ANA, ANATITER, LABANTI       Failed - Last BP in normal range    BP Readings from Last 1 Encounters:  12/11/23 (!) 158/88         Passed - HCT in normal range and within 360 days    HCT  Date Value Ref Range Status  04/08/2023 43.4 35.0 - 45.0 % Final   Hematocrit  Date Value Ref Range Status  03/13/2017 44.7 34.0 - 46.6 % Final         Passed - HGB in normal range and within 360 days    Hemoglobin  Date Value Ref Range Status  04/08/2023 14.3 11.7 - 15.5 g/dL Final  98/82/7980 85.0 11.1 - 15.9 g/dL Final         Passed - RBC in normal range and within 360 days    RBC  Date Value Ref Range Status  04/08/2023 4.90 3.80 - 5.10 Million/uL Final         Passed - PLT in normal range and within 360 days    Platelets  Date Value Ref Range Status  04/08/2023 311 140 - 400 Thousand/uL Final  03/13/2017 291 150 - 379 x10E3/uL Final         Passed - Valid encounter within last 12 months    Recent Outpatient Visits           2 weeks ago Postauricular pain   [facility] [place], Angeline ORN, NP   2 months ago Essential hypertension   [facility] [place], Angeline ORN, NP   2 months ago Mixed hyperlipidemia   [facility] [place], Angeline ORN, NP   6 months ago Essential hypertension   [facility] [place], Angeline ORN, NP   7 months ago ETD (Eustachian tube dysfunction),  bilateral    [facility] [place], Angeline ORN, [place]

## 2024-01-18 ENCOUNTER — Ambulatory Visit
Admission: EM | Admit: 2024-01-18 | Discharge: 2024-01-18 | Disposition: A | Discharge status: Home / Self Care | Attending: Physician Assistant | Admitting: Physician Assistant

## 2024-01-18 ENCOUNTER — Ambulatory Visit (INDEPENDENT_AMBULATORY_CARE_PROVIDER_SITE_OTHER)

## 2024-01-18 ENCOUNTER — Encounter: Payer: Self-pay | Admitting: Emergency Medicine

## 2024-01-18 DIAGNOSIS — I10 Essential (primary) hypertension: Secondary | ICD-10-CM

## 2024-01-18 DIAGNOSIS — R051 Acute cough: Secondary | ICD-10-CM | POA: Diagnosis not present

## 2024-01-18 DIAGNOSIS — R0989 Other specified symptoms and signs involving the circulatory and respiratory systems: Secondary | ICD-10-CM

## 2024-01-18 DIAGNOSIS — F17200 Nicotine dependence, unspecified, uncomplicated: Secondary | ICD-10-CM | POA: Diagnosis not present

## 2024-01-18 DIAGNOSIS — J209 Acute bronchitis, unspecified: Secondary | ICD-10-CM | POA: Diagnosis not present

## 2024-01-18 LAB — POC COVID19/FLU A&B COMBO
Covid Antigen, POC: NEGATIVE
Influenza A Antigen, POC: NEGATIVE
Influenza B Antigen, POC: NEGATIVE

## 2024-01-18 MED ORDER — PROMETHAZINE-DM 6.25-15 MG/5ML PO SYRP: 5.0000 mL | ORAL_SOLUTION | Freq: Four times a day (QID) | ORAL | 0 refills | Status: AC | PRN

## 2024-01-18 NOTE — Discharge Instructions (Signed)
-  Negative COVID - X-ray does not show any evidence of pneumonia. - You may have a chest cold which can last couple weeks.  I sent cough medication to pharmacy.  Increase rest and fluids. - If fever, symptoms not improving over the next couple of weeks, increased breathing difficulty please return for reevaluation.

## 2024-01-18 NOTE — ED Triage Notes (Signed)
Patient c/o cough and chest congestion since Wed.  Patient denies fevers.  

## 2024-01-18 NOTE — ED Provider Notes (Signed)
 [facility]    CSN: [id number] Arrival date & time: 01/18/24  0857      History   Chief Complaint Chief Complaint  Patient presents with   Cough    HPI [name] is a 58 y.o. female with history of renal artery stenosis, hypertension, hyperlipidemia, anxiety, obesity, tobacco abuse presents for 4-day history of cough, chest congestion, chest tightness, and fatigue.  She denies fever, sore throat, runny nose, sinus pain, ear pain, shortness of breath, wheezing, abdominal pain, vomiting or diarrhea.  Has been around her grandchild who has been sick with a cold recently.  Child was diagnosed with ear infections and a sinus infection.  Patient has been taking Robitussin over-the-counter without relief.  She is a smoker.  She denies any history of COPD, asthma or other lung disease.  HPI  Past Medical History:  Diagnosis Date   Anxiety Last year   Fatigue    Heart murmur N/A   Hyperlipemia    Hypertension Last year   Irregular menstrual cycle    Obesity     Patient Active Problem List   Diagnosis Date Noted   Gastroesophageal reflux disease without esophagitis 08/17/2020   Class 1 obesity due to excess calories with body mass index (BMI) of 34.0 to 34.9 in adult 08/17/2020   Psychophysiological insomnia 11/08/2019   OSA on CPAP 11/08/2019   Essential hypertension 02/11/2018   Anxiety 02/08/2016   Mixed hyperlipidemia 02/08/2016    Past Surgical History:  Procedure Laterality Date   BACK SURGERY     x 4   SPINE SURGERY  10 years ago    OB History     Gravida  2   Para  2   Term  2   Preterm      AB      Living  2      SAB      IAB      Ectopic      Multiple      Live Births  2            Home Medications    Prior to Admission medications   Medication Sig Start Date End Date Taking? Authorizing Provider  promethazine -dextromethorphan (PROMETHAZINE -DM) 6.25-15 MG/5ML syrup Take 5 mLs by mouth 4 (four) times daily as  needed. 01/18/24  Yes Arvis Jolan NOVAK, PA-C  amLODipine -olmesartan  (AZOR ) 10-40 MG tablet TAKE 1 TABLET BY MOUTH EVERY DAY 10/28/23   Antonette Angeline ORN, NP  FLUoxetine  (PROZAC ) 10 MG capsule Take 1 capsule (10 mg total) by mouth daily. 04/24/23   Antonette Angeline ORN, NP  gabapentin  (NEURONTIN ) 100 MG capsule Take 1 capsule (100 mg total) by mouth at bedtime. 10/02/23   Antonette Angeline ORN, NP  hydrALAZINE  (APRESOLINE ) 25 MG tablet TAKE 1 TABLET BY MOUTH TWICE A DAY 12/27/23   Antonette Angeline ORN, NP  rosuvastatin  (CRESTOR ) 40 MG tablet Take 1 tablet (40 mg total) by mouth daily. 10/02/23   Antonette Angeline ORN, NP    Family History Family History  Problem Relation Age of Onset   Hypertension Mother    Heart disease Mother    Kidney disease Mother    Heart disease Father    Hypertension Father    Thyroid  disease Father    Heart attack Father 61   CAD Father    Kidney failure Father    Heart failure Maternal Grandmother    Lung cancer Maternal Grandfather    Cancer Paternal Grandmother  Coronary artery disease Paternal Grandmother    Cancer Paternal Grandfather    Diabetes Neg Hx    Breast cancer Neg Hx    Colon cancer Neg Hx     Social History Social History   Tobacco Use   Smoking status: Every Day    Current packs/day: 1.50    Average packs/day: 1.5 packs/day for 30.8 years (46.2 ttl pk-yrs)    Types: Cigarettes    Start date: 03/23/1993   Smokeless tobacco: Never  Vaping Use   Vaping status: Never Used  Substance Use Topics   Alcohol use: No    Alcohol/week: 0.0 standard drinks of alcohol   Drug use: No     Allergies   Patient has no known allergies.   Review of Systems Review of Systems  Constitutional:  Positive for fatigue. Negative for chills, diaphoresis and fever.  HENT:  Positive for congestion. Negative for ear pain, rhinorrhea, sinus pressure, sinus pain and sore throat.   Respiratory:  Positive for cough and chest tightness. Negative for shortness of breath.    Cardiovascular:  Negative for chest pain.  Gastrointestinal:  Negative for abdominal pain, nausea and vomiting.  Musculoskeletal:  Negative for arthralgias and myalgias.  Skin:  Negative for rash.  Neurological:  Negative for weakness and headaches.  Hematological:  Negative for adenopathy.     Physical Exam Triage Vital Signs ED Triage Vitals  Encounter Vitals Group     BP      Girls Systolic BP Percentile      Girls Diastolic BP Percentile      Boys Systolic BP Percentile      Boys Diastolic BP Percentile      Pulse      Resp      Temp      Temp src      SpO2      Weight      Height      Head Circumference      Peak Flow      Pain Score      Pain Loc      Pain Education      Exclude from Growth Chart    No data found.  Updated Vital Signs BP (!) 180/98 (BP Location: Left Arm)   Pulse 80   Temp 97.8 F (36.6 C) (Oral)   Resp 14   Ht 5' 2 (1.575 m)   Wt 187 lb 9.8 oz (85.1 kg)   SpO2 96%   BMI 34.31 kg/m    Previous BP readings:   12/11/23: 158/88 10/20/23: 144/88 08/07/23: 189/93   Physical Exam Vitals and nursing note reviewed.  Constitutional:      General: She is not in acute distress.    Appearance: Normal appearance. She is not ill-appearing or toxic-appearing.  HENT:     Head: Normocephalic and atraumatic.     Right Ear: Tympanic membrane, ear canal and external ear normal.     Left Ear: Tympanic membrane, ear canal and external ear normal.     Nose: Nose normal. No congestion.     Mouth/Throat:     Mouth: Mucous membranes are moist.     Pharynx: Oropharynx is clear.  Eyes:     General: No scleral icterus.       Right eye: No discharge.        Left eye: No discharge.     Conjunctiva/sclera: Conjunctivae normal.  Cardiovascular:     Rate and Rhythm: Normal rate and regular rhythm.  Heart sounds: Normal heart sounds.  Pulmonary:     Effort: Pulmonary effort is normal. No respiratory distress.     Breath sounds: Rhonchi present.   Musculoskeletal:     Cervical back: Neck supple.  Skin:    General: Skin is dry.  Neurological:     General: No focal deficit present.     Mental Status: She is alert. Mental status is at baseline.     Motor: No weakness.     Gait: Gait normal.  Psychiatric:        Mood and Affect: Mood normal.        Behavior: Behavior normal.      UC Treatments / Results  Labs (all labs ordered are listed, but only abnormal results are displayed) Labs Reviewed  POC COVID19/FLU A&B COMBO - Normal    EKG   Radiology DG Chest 2 View Result Date: 01/18/2024 CLINICAL DATA:  cough, chest congestion x 4 days. chest tightness., smoker EXAM: CHEST - 2 VIEW COMPARISON:  June 16, 2013 FINDINGS: The cardiomediastinal silhouette is unchanged in contour.Atherosclerotic calcifications. No pleural effusion. No pneumothorax. No acute pleuroparenchymal abnormality. Visualized abdomen is unremarkable. Multilevel degenerative changes of the thoracic spine. IMPRESSION: No acute cardiopulmonary abnormality. Electronically Signed   By: Corean Salter M.D.   On: 01/18/2024 10:12    Procedures Procedures (including critical care time)  Medications Ordered in UC Medications - No data to display  Initial Impression / Assessment and Plan / UC Course  I have reviewed the triage vital signs and the nursing notes.  Pertinent labs & imaging results that were available during my care of the patient were reviewed by me and considered in my medical decision making (see chart for details).   58 year old female presents for fatigue, mostly dry cough, chest congestion, and chest tightness for the past 4 days.  Has been around her sick grandchild.  Patient has not had fever or any upper respiratory symptoms.  No history of COPD or asthma.  BP elevated 207/92.  Recheck 180/98.  History of hypertension and renal artery stenosis.  Previous blood pressure readings have been elevated.  She is treated with multiple  medications including hydralazine  and amlodipine -olmesartan .  Should continue medications, keep log of BP and continue to follow-up with PCP and/or cardiology.  If any associated dizziness, severe headaches, weakness, breathing difficulty, chest pain she should go to emergency department.  Other vitals normal and stable and she is overall well-appearing.  Exam only significant for a few scattered rhonchi of upper lung fields.  COVID test negative.  Chest x-ray obtained to evaluate for possible pneumonia.  Negative imaging.  Reviewed all results patient.  Acute bronchitis.  Supportive care encouraged.  Advised increasing rest and fluids.  Sent Promethazine  DM.  Advised to return if not feeling better in the next couple weeks, if she develops fever or has increased breathing problem.  Patient is agreeable.   Final Clinical Impressions(s) / UC Diagnoses   Final diagnoses:  Acute cough  Chest congestion  Acute bronchitis, unspecified organism  Smoker     Discharge Instructions      -Negative COVID - X-ray does not show any evidence of pneumonia. - You may have a chest cold which can last couple weeks.  I sent cough medication to pharmacy.  Increase rest and fluids. - If fever, symptoms not improving over the next couple of weeks, increased breathing difficulty please return for reevaluation.     ED Prescriptions     Medication  Sig Dispense Auth. Provider   promethazine -dextromethorphan (PROMETHAZINE -DM) 6.25-15 MG/5ML syrup Take 5 mLs by mouth 4 (four) times daily as needed. 118 mL Arvis Jolan NOVAK, PA-C      PDMP not reviewed this encounter.   Arvis Jolan NOVAK, PA-C 01/18/24 1035

## 2024-04-09 ENCOUNTER — Encounter: Admitting: Internal Medicine
